# Patient Record
Sex: Female | Born: 1950 | Race: Black or African American | Hispanic: No | Smoking: Former smoker
Health system: Southern US, Community
[De-identification: ages and names within clinical notes are randomized; demographics above are authoritative.]

## PROBLEM LIST (undated history)

## (undated) DIAGNOSIS — G629 Polyneuropathy, unspecified: Secondary | ICD-10-CM

## (undated) DIAGNOSIS — I2510 Atherosclerotic heart disease of native coronary artery without angina pectoris: Secondary | ICD-10-CM

## (undated) DIAGNOSIS — E785 Hyperlipidemia, unspecified: Secondary | ICD-10-CM

## (undated) DIAGNOSIS — I471 Supraventricular tachycardia: Secondary | ICD-10-CM

## (undated) DIAGNOSIS — I341 Nonrheumatic mitral (valve) prolapse: Secondary | ICD-10-CM

## (undated) DIAGNOSIS — I10 Essential (primary) hypertension: Secondary | ICD-10-CM

## (undated) DIAGNOSIS — K219 Gastro-esophageal reflux disease without esophagitis: Secondary | ICD-10-CM

## (undated) DIAGNOSIS — F32A Depression, unspecified: Secondary | ICD-10-CM

## (undated) DIAGNOSIS — N289 Disorder of kidney and ureter, unspecified: Secondary | ICD-10-CM

## (undated) DIAGNOSIS — F329 Major depressive disorder, single episode, unspecified: Secondary | ICD-10-CM

## (undated) DIAGNOSIS — M1990 Unspecified osteoarthritis, unspecified site: Secondary | ICD-10-CM

## (undated) HISTORY — DX: Essential (primary) hypertension: I10

## (undated) HISTORY — DX: Gastro-esophageal reflux disease without esophagitis: K21.9

## (undated) HISTORY — DX: Unspecified osteoarthritis, unspecified site: M19.90

## (undated) HISTORY — DX: Supraventricular tachycardia: I47.1

## (undated) HISTORY — DX: Atherosclerotic heart disease of native coronary artery without angina pectoris: I25.10

## (undated) HISTORY — PX: OTHER SURGICAL HISTORY: SHX169

## (undated) HISTORY — DX: Nonrheumatic mitral (valve) prolapse: I34.1

## (undated) HISTORY — DX: Major depressive disorder, single episode, unspecified: F32.9

## (undated) HISTORY — DX: Depression, unspecified: F32.A

## (undated) HISTORY — DX: Hyperlipidemia, unspecified: E78.5

## (undated) HISTORY — DX: Polyneuropathy, unspecified: G62.9

---

## 1976-01-08 HISTORY — PX: CHOLECYSTECTOMY: SHX55

## 2002-01-07 DIAGNOSIS — I4719 Other supraventricular tachycardia: Secondary | ICD-10-CM

## 2002-01-07 DIAGNOSIS — I471 Supraventricular tachycardia: Secondary | ICD-10-CM

## 2002-01-07 HISTORY — DX: Supraventricular tachycardia: I47.1

## 2002-01-07 HISTORY — DX: Other supraventricular tachycardia: I47.19

## 2002-01-21 HISTORY — PX: CARDIAC CATHETERIZATION: SHX172

## 2002-01-22 ENCOUNTER — Observation Stay (HOSPITAL_COMMUNITY): Admission: EM | Admit: 2002-01-22 | Discharge: 2002-01-22 | Payer: Self-pay | Admitting: *Deleted

## 2002-01-26 ENCOUNTER — Ambulatory Visit (HOSPITAL_COMMUNITY): Admission: RE | Admit: 2002-01-26 | Discharge: 2002-01-27 | Payer: Self-pay | Admitting: Cardiology

## 2002-11-19 ENCOUNTER — Encounter: Admission: RE | Admit: 2002-11-19 | Discharge: 2002-11-19 | Payer: Self-pay | Admitting: Anesthesiology

## 2002-12-28 ENCOUNTER — Encounter: Admission: RE | Admit: 2002-12-28 | Discharge: 2002-12-28 | Payer: Self-pay | Admitting: Neurological Surgery

## 2003-01-12 ENCOUNTER — Encounter: Admission: RE | Admit: 2003-01-12 | Discharge: 2003-01-12 | Payer: Self-pay | Admitting: Neurological Surgery

## 2003-01-26 ENCOUNTER — Encounter: Admission: RE | Admit: 2003-01-26 | Discharge: 2003-01-26 | Payer: Self-pay | Admitting: Anesthesiology

## 2004-10-25 IMAGING — CT CT L SPINE W/ CM
2 of 8 series · 9 of 20 positions shown, 11 images · IV contrast (omnipaque)
Comparison: none

CLINICAL DATA: Patient with back pain extending into both legs.  She is referred for lumbar myelogram.
 LUMBAR MYELOGRAM
 Using sterile technique under fluoroscopic guidance, I placed a 22 gauge spinal needle into the thecal sac at the L2 level.  CSF is clear.  15 cc of Omnipaque 180 contrast was injected.  The needle is withdrawn.  Multiple images were obtained supplemented with standing flexion and extension views.
 Patient had rudimentary ribs at T12 with 5 lumbar vertebrae.  Mild degenerative disk disease is noted at L2-3 and L3-4.  Shallow ventral defects are seen at L2-3, L3-4, L4-5.  There is no appreciable listhesis for flexion and extension.
 IMPRESSION
 Degenerative changes mid lumbar spine.  CT to follow.
 CT LUMBAR SPINE POST INTRATHECAL CONTRAST
 Axial imaging is directed through the disk levels at L1-2 inferiorly to L5-S1.
 L1-2:  Level is normal in appearance.
 L2-3:  Mild circumferential disk bulge without focal protrusion.  There is no appreciable central nor foraminal stenosis.  Mild facet degenerative changes.
 L3-4:  Diffuse disk bulge without focal protrusion.  Negative for central nor foraminal stenosis.  Mild facet degenerative change.
 L4-5:  Diffuse circumferential disk bulge.  Mild facet degenerative changes bilaterally.  No appreciable central nor foraminal stenosis. 
 L5-S1:  Mild diffuse disk bulge without focal protrusion.  The L5 roots exit without encroachment.  There is no central stenosis.  Facet osteoarthritic changes are seen bilaterally.
 Mild degenerative changes as described.  There is no appreciable central nor foraminal stenosis.  The patient has facet degenerative changes bilaterally predominantly at the L4-5 and L5-S1 levels.  
 MULTIPLANAR REFORMATTED IMAGING
 The sagittal reformatted images demonstrate anatomic alignment of the lumbar spine without listhesis.  Disk protrusions at L2-3, L3-4, and L4-5 redemonstrated.  Under parasagittal sequences, diffuse disk bulge at L3-4 is noted which narrows the inferior foramina bilaterally.  Disk material extends laterally into the left L3 foramen abutting the L3 dorsal root ganglion.  This is not well appreciated in the axial projections.  The remainder of the foramina are patent.  
 Diffuse disk bulge at L3 narrows the inferior foramina with left lateral extension as described above.

[Series 3: recon 2: l-spine helical · axial · 0.27mm/px · z∈[-298,-153]mm · 6 of 339 slices shown, 8 images]
[im 49/339  soft-tissue]
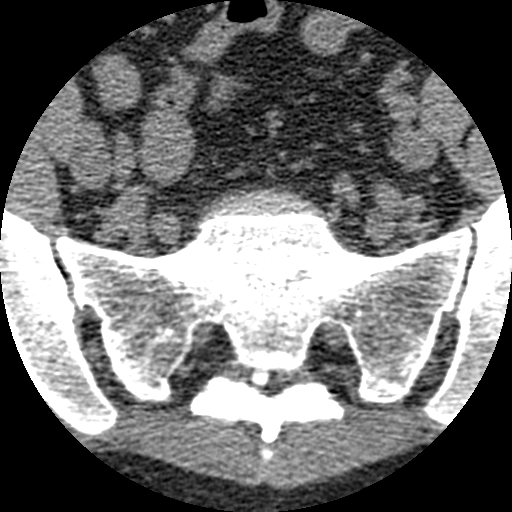
[im 49/339  bone]
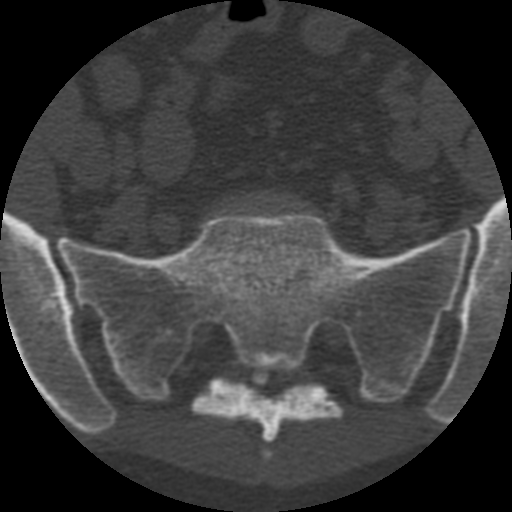
[im 97/339  bone]
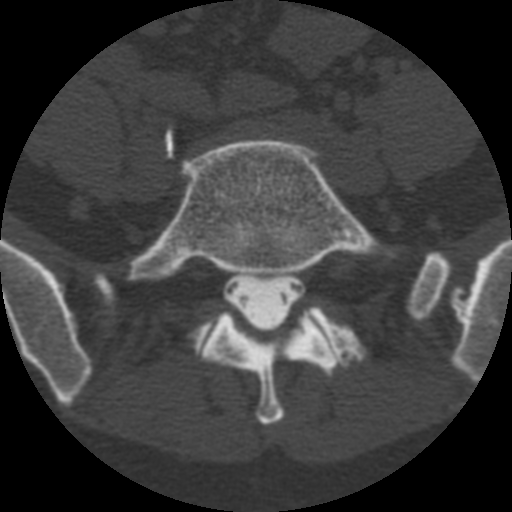
[im 145/339  bone]
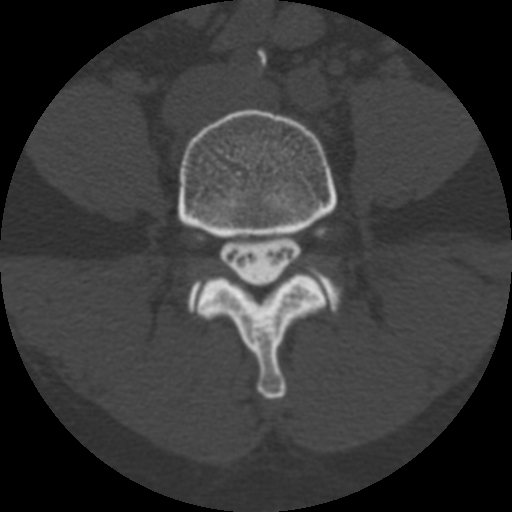
[im 194/339  bone]
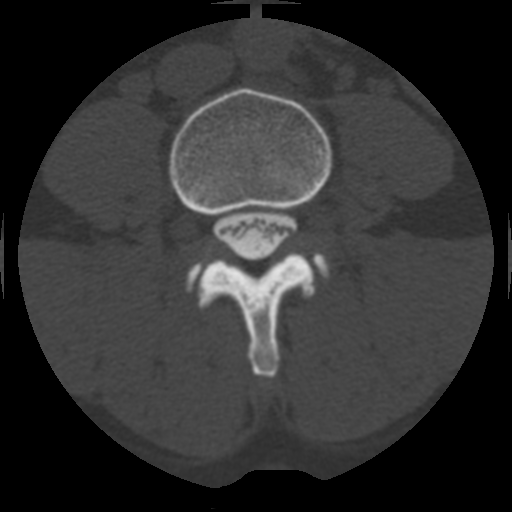
[im 242/339  soft-tissue]
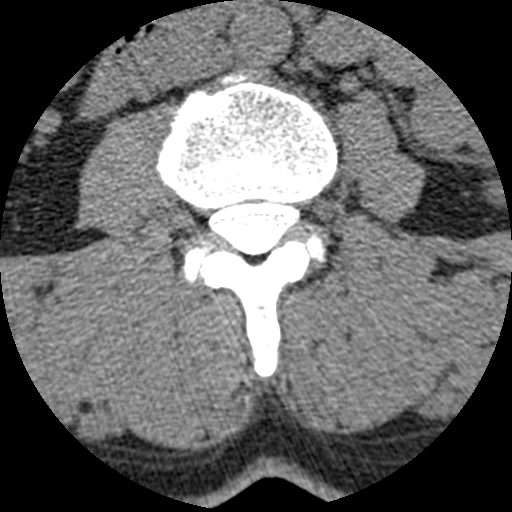
[im 242/339  bone]
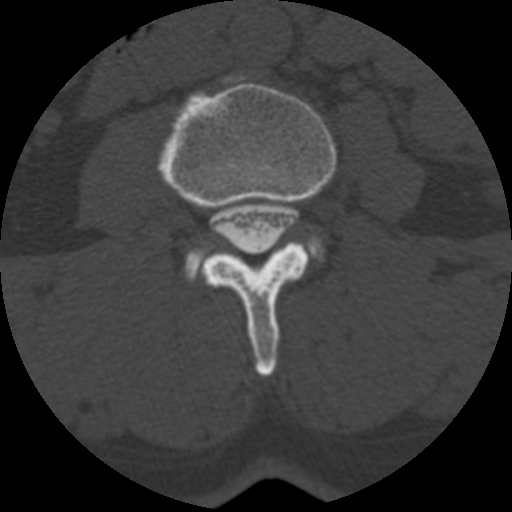
[im 290/339  bone]
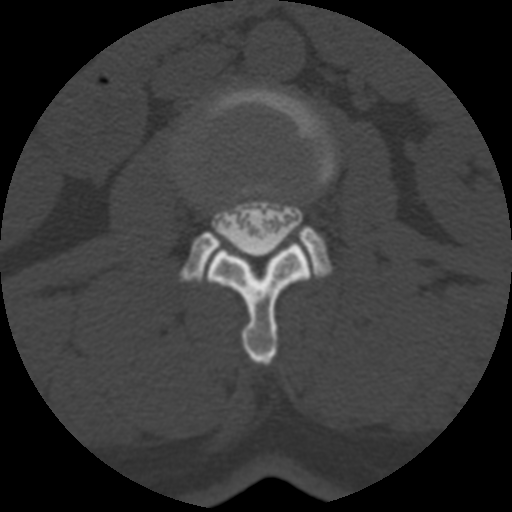

[Series 523: average · sagittal · 0.41mm/px · 3 of 40 slices shown]
[im 8/40  bone]
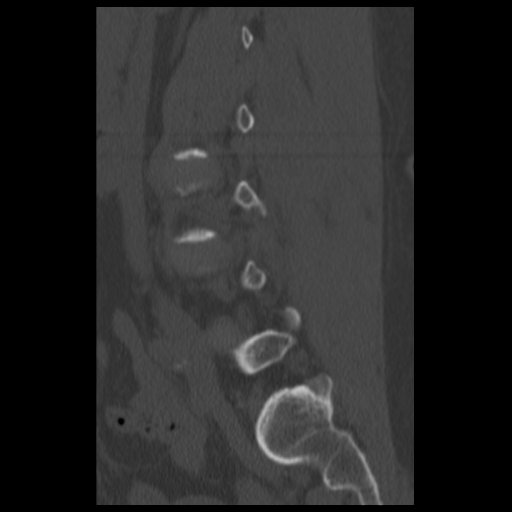
[im 16/40  bone]
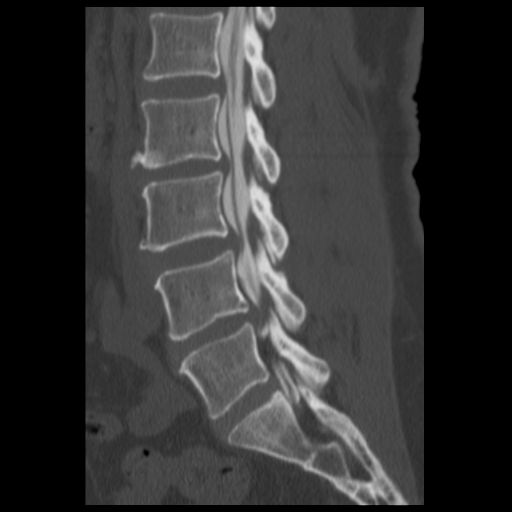
[im 24/40  bone]
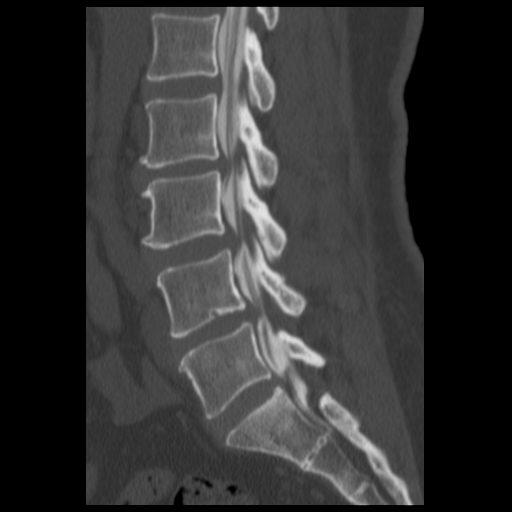

[9 of 20 positions shown; findings below may reference images not displayed]

## 2005-01-09 ENCOUNTER — Ambulatory Visit: Payer: Self-pay | Admitting: Cardiology

## 2005-02-18 ENCOUNTER — Ambulatory Visit: Payer: Self-pay | Admitting: Cardiology

## 2006-10-01 ENCOUNTER — Ambulatory Visit: Payer: Self-pay | Admitting: Cardiology

## 2010-12-05 ENCOUNTER — Encounter: Payer: Self-pay | Admitting: *Deleted

## 2010-12-06 ENCOUNTER — Ambulatory Visit (INDEPENDENT_AMBULATORY_CARE_PROVIDER_SITE_OTHER): Payer: Self-pay | Admitting: Cardiovascular Disease

## 2010-12-06 ENCOUNTER — Encounter: Payer: Self-pay | Admitting: *Deleted

## 2010-12-06 ENCOUNTER — Encounter: Payer: Self-pay | Admitting: Cardiovascular Disease

## 2010-12-06 DIAGNOSIS — R079 Chest pain, unspecified: Secondary | ICD-10-CM

## 2010-12-06 DIAGNOSIS — R0602 Shortness of breath: Secondary | ICD-10-CM

## 2010-12-06 DIAGNOSIS — I498 Other specified cardiac arrhythmias: Secondary | ICD-10-CM

## 2010-12-06 DIAGNOSIS — I4719 Other supraventricular tachycardia: Secondary | ICD-10-CM

## 2010-12-06 DIAGNOSIS — I10 Essential (primary) hypertension: Secondary | ICD-10-CM

## 2010-12-06 DIAGNOSIS — I2510 Atherosclerotic heart disease of native coronary artery without angina pectoris: Secondary | ICD-10-CM

## 2010-12-06 DIAGNOSIS — I471 Supraventricular tachycardia: Secondary | ICD-10-CM

## 2010-12-06 NOTE — Assessment & Plan Note (Signed)
She is currently in sinus rhythm with PACs. She has not had any documented arrhythmia since that ablation in 2004.

## 2010-12-06 NOTE — Patient Instructions (Signed)
   Echo  Cardiolite stress test - hold Toprol on day of test If the results of your test are normal or stable, you will receive a letter.  If they are abnormal, the nurse will contact you by phone. Follow up in  1 month - see above

## 2010-12-06 NOTE — Assessment & Plan Note (Signed)
The patient is currently having chest pain with some anginal features and some atypical features. She seems to be under significant stress at work which might be contributing to her symptoms. Nonetheless, she has multiple risk factors for coronary artery disease. Previous cardiac catheterization in 2004 showed significant disease in a diagonal branch but otherwise no significant disease in the main arteries. Due to all of that, I recommend further evaluation with a treadmill nuclear stress test. Her baseline ECG is slightly abnormal with poor R-wave progression in the anterior leads and nonspecific T wave changes. In the meantime I recommend continuing current medications and taking her risk factors. She does have associated dyspnea and will request a transthoracic echocardiogram as well.

## 2010-12-06 NOTE — Assessment & Plan Note (Signed)
Her blood pressure is slightly low. We'll recheck upon followup.

## 2010-12-06 NOTE — Progress Notes (Signed)
HPI  This is a 60 year old female who is referred for evaluation of chest pain. The patient had cardiac catheterization in the past in 2004 which showed an 80% stenosis in a moderate diagonal branch and no other significant disease. 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 She underwent successful ablation at that time without recurrences since then. The patient has been having chest pain for the last few months. It's described as substernal tightness which mostly happens at work when she is under significant stress. 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 She describes excess mental and physical status when she is working. The chest pain usually lasts for a few minutes and subsides on its own. It does not radiate. It is associated with dyspnea. She denies any palpitations, syncope or presyncope. She has chronic medical conditions that include diabetes, hypertension and hyperlipidemia.  Allergies  Allergen Reactions  . Latex     Itching and rash     Current Outpatient Prescriptions on File Prior to Visit  Medication Sig Dispense Refill  . aspirin EC 81 MG tablet Take 81 mg by mouth daily.        . citalopram (CELEXA) 40 MG tablet Take 40 mg by mouth daily.        Marland Kitchen lisinopril-hydrochlorothiazide (PRINZIDE,ZESTORETIC) 20-12.5 MG per tablet Take 0.5 tablets by mouth daily.        . metFORMIN (GLUCOPHAGE-XR) 500 MG 24 hr tablet Take 500 mg by mouth 2 (two) times daily.        . metoprolol succinate (TOPROL-XL) 25 MG 24 hr tablet Take 25 mg by mouth daily.        . Multiple Vitamins-Minerals (CENTRUM SILVER PO) Take 1 tablet by mouth daily.        Marland Kitchen oxybutynin (DITROPAN) 5 MG tablet Take 5 mg by mouth 3 (three) times daily.        . simvastatin (ZOCOR) 80 MG tablet Take 80 mg by mouth at bedtime.           Past Medical History  Diagnosis Date  . Diabetes mellitus   . Dyslipidemia   . Mitral valve prolapse   .  GERD (gastroesophageal reflux disease)   . Peripheral neuropathy   . Depression   . Osteoarthritis   . CAD (coronary artery disease)     80% stenosis in moderate size diagonal on cath in 2004. with last adenosine Cardiolite study in 2007 showing no abnormalities  . Hypertension   . AV nodal tachycardia 2004    with radiofrequency ablation by Dr. Grant Fontana in 2004  . Hyperlipidemia      Past Surgical History  Procedure Date  . Cholecystectomy 1978  . Left breast biopsy     By Dr. Gabriel Cirri showed fibrocystic changes  . Cardiac catheterization 01/21/2002    80% stenosis of moderate sized diagonal branch     Family History  Problem Relation Age of Onset  . Cancer Father   . Pneumonia Mother   . Cancer Brother     3 brothers died from cancer     History   Social History  . Marital Status: Divorced    Spouse Name: N/A    Number of Children: N/A  . Years of Education: N/A   Occupational History  . Not on file.   Social History Main Topics  . Smoking status: Never Smoker   . Smokeless tobacco: Never Used  .  Alcohol Use: No  . Drug Use: No  . Sexually Active: Not on file   Other Topics Concern  . 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 3 brothers died with cancer.     ROS Constitutional: Negative for fever, chills, diaphoresis, activity change, appetite change and fatigue.  HENT: Negative for hearing loss, nosebleeds, congestion, sore throat, facial swelling, drooling, trouble swallowing, neck pain, voice change, sinus pressure and tinnitus.  Eyes: Negative for photophobia, pain, discharge and visual disturbance.  Respiratory: Negative for apnea, cough and wheezing.  Cardiovascular: Negative for  palpitations and leg swelling.  Gastrointestinal: Negative for nausea, vomiting, abdominal pain, diarrhea, constipation, blood in stool and abdominal  distention.  Genitourinary: Negative for dysuria, urgency, frequency, hematuria and decreased urine volume.  Musculoskeletal: Negative for myalgias, back pain, joint swelling, arthralgias and gait problem.  Skin: Negative for color change, pallor, rash and wound.  Neurological: Negative for dizziness, tremors, seizures, syncope, speech difficulty, weakness, light-headedness, numbness and headaches.  Psychiatric/Behavioral: Negative for suicidal ideas, hallucinations, behavioral problems and agitation. The patient is not nervous/anxious.     PHYSICAL EXAM   BP 95/61  Pulse 67  Ht 5\' 7"  (1.702 m)  Wt 173 lb (78.472 kg)  BMI 27.10 kg/m2  Constitutional: She is oriented to person, place, and time. She appears well-developed and well-nourished. No distress.  HENT: No nasal discharge.  Head: Normocephalic and atraumatic.  Eyes: Pupils are equal, round, and reactive to light. Right eye exhibits no discharge. Left eye exhibits no discharge.  Neck: Normal range of motion. Neck supple. No JVD present. No thyromegaly present.  Cardiovascular: Normal rate, regular rhythm, normal heart sounds and intact distal pulses. Exam reveals no gallop and no friction rub.  No murmur heard.  Pulmonary/Chest: Effort normal and breath sounds normal. No stridor. No respiratory distress. She has no wheezes. She has no rales. She exhibits no tenderness.  Abdominal: Soft. Bowel sounds are normal. She exhibits no distension. There is no tenderness. There is no rebound and no guarding.  Musculoskeletal: Normal range of motion. She exhibits no edema and no tenderness.  Neurological: She is alert and oriented to person, place, and time. Coordination normal.  Skin: Skin is warm and dry. No rash noted. She is not diaphoretic. No erythema. No pallor.  Psychiatric: She has a normal mood and affect. Her behavior is normal. Judgment and thought content normal.    EKG: Normal sinus rhythm with frequent PACs. Poor R wave  progression in the precordial leads. Nonspecific T wave changes.   ASSESSMENT AND PLAN

## 2010-12-07 ENCOUNTER — Other Ambulatory Visit: Payer: Self-pay | Admitting: Cardiovascular Disease

## 2010-12-14 ENCOUNTER — Ambulatory Visit (HOSPITAL_COMMUNITY)
Admission: RE | Admit: 2010-12-14 | Discharge: 2010-12-14 | Disposition: A | Discharge status: Home / Self Care | Payer: Self-pay | Source: Ambulatory Visit | Attending: Cardiovascular Disease | Admitting: Cardiovascular Disease

## 2010-12-14 ENCOUNTER — Encounter (HOSPITAL_COMMUNITY): Payer: Self-pay | Admitting: Cardiology

## 2010-12-14 ENCOUNTER — Ambulatory Visit (INDEPENDENT_AMBULATORY_CARE_PROVIDER_SITE_OTHER): Payer: Self-pay | Admitting: *Deleted

## 2010-12-14 DIAGNOSIS — R079 Chest pain, unspecified: Secondary | ICD-10-CM | POA: Insufficient documentation

## 2010-12-14 DIAGNOSIS — I2510 Atherosclerotic heart disease of native coronary artery without angina pectoris: Secondary | ICD-10-CM

## 2010-12-14 DIAGNOSIS — E119 Type 2 diabetes mellitus without complications: Secondary | ICD-10-CM | POA: Insufficient documentation

## 2010-12-14 DIAGNOSIS — E785 Hyperlipidemia, unspecified: Secondary | ICD-10-CM | POA: Insufficient documentation

## 2010-12-14 DIAGNOSIS — R0602 Shortness of breath: Secondary | ICD-10-CM

## 2010-12-14 DIAGNOSIS — I10 Essential (primary) hypertension: Secondary | ICD-10-CM | POA: Insufficient documentation

## 2010-12-14 DIAGNOSIS — I517 Cardiomegaly: Secondary | ICD-10-CM

## 2010-12-14 MED ORDER — TECHNETIUM TC 99M TETROFOSMIN IV KIT
10.0000 | PACK | Freq: Once | INTRAVENOUS | Status: AC | PRN
  Administered 2010-12-14: 10 via INTRAVENOUS

## 2010-12-14 MED ORDER — TECHNETIUM TC 99M TETROFOSMIN IV KIT
30.0000 | PACK | Freq: Once | INTRAVENOUS | Status: AC | PRN
  Administered 2010-12-14: 30 via INTRAVENOUS

## 2010-12-14 NOTE — Progress Notes (Signed)
  Echocardiogram 2D Echocardiogram has been performed.  Dewitt Hoes, RDCS 12/14/2010, 1:43 PM

## 2010-12-14 NOTE — Progress Notes (Signed)
Stress Lab Nurses Notes - Sarah Baez 12/14/2010  Reason for doing test: CAD and Chest Pain Type of test: Lexiscan Myoview and Test Changed unable to reach THR, (Fatigue) Eugenie Birks given Nurse performing test: Parke Poisson, RN Nuclear Medicine Tech: Lyndel Pleasure Echo Tech: Not Applicable MD performing test: Ival Bible & Joni Reining NP Family MD: Sasser Test explained and consent signed: yes IV started: 22g jelco, Saline lock flushed, No redness or edema and Saline lock started in radiology Symptoms: SOB & Fatigue  while on TM and lightheaded with Lexiscan Treatment/Intervention: None Reason test stopped: protocol completed After recovery IV was: Discontinued via X-ray tech and No redness or edema Patient to return to Nuc. Med at : 12:00  Patient discharged: Home Patient's Condition upon discharge was: stable Comments: During test BP 162/72 & HR 122.  Recovery BP 118/60 & HR 84.   Symptoms resolved in recovery. Erskine Speed T

## 2010-12-18 ENCOUNTER — Encounter: Payer: Self-pay | Admitting: *Deleted

## 2011-01-04 ENCOUNTER — Ambulatory Visit (INDEPENDENT_AMBULATORY_CARE_PROVIDER_SITE_OTHER): Payer: Self-pay | Admitting: Cardiovascular Disease

## 2011-01-04 ENCOUNTER — Encounter: Payer: Self-pay | Admitting: Cardiovascular Disease

## 2011-01-04 DIAGNOSIS — R079 Chest pain, unspecified: Secondary | ICD-10-CM | POA: Insufficient documentation

## 2011-01-04 NOTE — Patient Instructions (Signed)
   Follow up as needed. Your physician recommends that you continue on your current medications as directed. Please refer to the Current Medication list given to you today. 

## 2011-01-04 NOTE — Assessment & Plan Note (Signed)
Her chest pain is likely noncardiac and I suspect is related to stress and anxiety. Her cardiac workup was unremarkable including stress test and echocardiogram. I recommend continuing aggressive treatment of risk factors. I also suggested starting an exercise program. She might have to consider switching her job if she continues to have significant stress. She can followup with [place] as needed.

## 2011-01-04 NOTE — Progress Notes (Signed)
HPI   this is a 60 year old female who is here today for followup visit. She was seen recently for atypical chest pain and dyspnea. She underwent cardiac evaluation that included a nuclear stress test and an echocardiogram. Her stress test overall was unremarkable. Echocardiogram showed normal LV systolic function without significant valvular abnormalities. Her chest pain overall is better than before. It's mostly related to stress at her work.  Allergies  Allergen Reactions  . Latex     Itching and rash     Current Outpatient Prescriptions on File Prior to Visit  Medication Sig Dispense Refill  . aspirin EC 81 MG tablet Take 81 mg by mouth daily.        . Cholecalciferol (VITAMIN D3) 5000 UNITS CAPS Take by mouth daily.        . citalopram (CELEXA) 40 MG tablet Take 40 mg by mouth daily.        Marland Kitchen lisinopril-hydrochlorothiazide (PRINZIDE,ZESTORETIC) 20-12.5 MG per tablet Take 0.5 tablets by mouth daily.        . metFORMIN (GLUCOPHAGE-XR) 500 MG 24 hr tablet Take 500 mg by mouth 2 (two) times daily.        . metoprolol succinate (TOPROL-XL) 25 MG 24 hr tablet Take 25 mg by mouth daily.        . Multiple Vitamins-Minerals (CENTRUM SILVER PO) Take 1 tablet by mouth daily.        Marland Kitchen oxybutynin (DITROPAN) 5 MG tablet Take 5 mg by mouth 3 (three) times daily.        . simvastatin (ZOCOR) 80 MG tablet Take 80 mg by mouth at bedtime.           Past Medical History  Diagnosis Date  . Diabetes mellitus   . Dyslipidemia   . Mitral valve prolapse   . GERD (gastroesophageal reflux disease)   . Peripheral neuropathy   . Depression   . Osteoarthritis   . CAD (coronary artery disease)     80% stenosis in moderate size diagonal on cath in 2004. with last adenosine Cardiolite study in 2007 showing no abnormalities  . Hypertension   . AV nodal tachycardia 2004    with radiofrequency ablation by Dr. Grant Fontana in 2004  . Hyperlipidemia      Past Surgical History  Procedure Date  . Cholecystectomy  1978  . Left breast biopsy     By Dr. Gabriel Cirri showed fibrocystic changes  . Cardiac catheterization 01/21/2002    80% stenosis of moderate sized diagonal branch     Family History  Problem Relation Age of Onset  . Cancer Father   . Pneumonia Mother   . Cancer Brother     3 brothers died from cancer     History   Social History  . Marital Status: Divorced    Spouse Name: N/A    Number of Children: N/A  . Years of Education: N/A   Occupational History  . Not on file.   Social History Main Topics  . Smoking status: Never Smoker   . Smokeless tobacco: Never Used  . Alcohol Use: No  . Drug Use: No  . Sexually Active: Not on file   Other Topics Concern  . 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 3 brothers died with cancer.     PHYSICAL EXAM   BP 136/78  Pulse 62  Ht 5\' 5"  (1.651  m)  Wt 152 lb (68.947 kg)  BMI 25.29 kg/m2  Constitutional: She is oriented to person, place, and time. She appears well-developed and well-nourished. No distress.  HENT: No nasal discharge.  Head: Normocephalic and atraumatic.  Eyes: Pupils are equal, round, and reactive to light. Right eye exhibits no discharge. Left eye exhibits no discharge.  Neck: Normal range of motion. Neck supple. No JVD present. No thyromegaly present.  Cardiovascular: Normal rate, regular rhythm, normal heart sounds . Exam reveals no gallop and no friction rub.  No murmur heard.  Pulmonary/Chest: Effort normal and breath sounds normal. No stridor. No respiratory distress. She has no wheezes. She has no rales. She exhibits no tenderness.  Abdominal: Soft. Bowel sounds are normal. She exhibits no distension. There is no tenderness. There is no rebound and no guarding.  Musculoskeletal: Normal range of motion. She exhibits no edema and no tenderness.  Neurological: She is alert and oriented to  person, place, and time. Coordination normal.  Skin: Skin is warm and dry. No rash noted. She is not diaphoretic. No erythema. No pallor.  Psychiatric: She has a normal mood and affect. Her behavior is normal. Judgment and thought content normal.      ASSESSMENT AND PLAN

## 2013-10-13 DIAGNOSIS — F321 Major depressive disorder, single episode, moderate: Secondary | ICD-10-CM | POA: Diagnosis not present

## 2013-10-13 DIAGNOSIS — K210 Gastro-esophageal reflux disease with esophagitis: Secondary | ICD-10-CM | POA: Diagnosis not present

## 2013-10-13 DIAGNOSIS — Z23 Encounter for immunization: Secondary | ICD-10-CM | POA: Diagnosis not present

## 2013-10-13 DIAGNOSIS — E119 Type 2 diabetes mellitus without complications: Secondary | ICD-10-CM | POA: Diagnosis not present

## 2013-10-13 DIAGNOSIS — E780 Pure hypercholesterolemia: Secondary | ICD-10-CM | POA: Diagnosis not present

## 2013-10-13 DIAGNOSIS — I10 Essential (primary) hypertension: Secondary | ICD-10-CM | POA: Diagnosis not present

## 2013-11-23 DIAGNOSIS — R072 Precordial pain: Secondary | ICD-10-CM | POA: Diagnosis not present

## 2013-12-06 ENCOUNTER — Encounter: Payer: Self-pay | Admitting: Cardiology

## 2013-12-06 DIAGNOSIS — R9439 Abnormal result of other cardiovascular function study: Secondary | ICD-10-CM | POA: Diagnosis not present

## 2013-12-06 DIAGNOSIS — R0602 Shortness of breath: Secondary | ICD-10-CM | POA: Diagnosis not present

## 2013-12-06 DIAGNOSIS — R079 Chest pain, unspecified: Secondary | ICD-10-CM | POA: Diagnosis not present

## 2013-12-06 NOTE — Progress Notes (Signed)
ERROR

## 2013-12-22 ENCOUNTER — Encounter: Payer: Self-pay | Admitting: *Deleted

## 2013-12-22 ENCOUNTER — Ambulatory Visit (INDEPENDENT_AMBULATORY_CARE_PROVIDER_SITE_OTHER): Payer: Medicare Other | Admitting: Cardiology

## 2013-12-22 ENCOUNTER — Encounter: Payer: Self-pay | Admitting: Cardiology

## 2013-12-22 VITALS — BP 111/68 | HR 59 | Ht 65.0 in | Wt 158.0 lb

## 2013-12-22 DIAGNOSIS — R0789 Other chest pain: Secondary | ICD-10-CM

## 2013-12-22 MED ORDER — NITROGLYCERIN 0.4 MG SL SUBL: 0.4000 mg | SUBLINGUAL_TABLET | SUBLINGUAL | Status: DC | PRN

## 2013-12-22 NOTE — Patient Instructions (Signed)
   Begin Nitroglycerin as needed for severe chest pain only - new sent to pharm Continue all other medications.   Your physician has requested that you have a cardiac catheterization. Cardiac catheterization is used to diagnose and/or treat various heart conditions. Doctors may recommend this procedure for a number of different reasons. The most common reason is to evaluate chest pain. Chest pain can be a symptom of coronary artery disease (CAD), and cardiac catheterization can show whether plaque is narrowing or blocking your heart's arteries. This procedure is also used to evaluate the valves, as well as measure the blood flow and oxygen levels in different parts of your heart. For further information please visit [email or web address] Please follow instruction sheet, as given. Follow up will be given at time of discharge from procedure.

## 2013-12-22 NOTE — Progress Notes (Signed)
   Clinical Summary Shelby Trevino is a 63 y.o.female seen today for the following medical problems.   1. Chest pain - history of atypical chest pain, workup in 2012 with nuclear stress and echo unremarkable. Prior notes mention chest pain going back as 2008. Based on clinic notes cath in 2008 showed diagonal disease per prior notes but otherwise patent vessels, cath report I do not see in our system.  - recent episodes of chest pain. Pressure right chest, 9/10. +SOB, + nausea. Can occur at rest or exertion. Worst with taking deep breath. Gets better with relaxation. Varying in duration, can last minutes to hours. Occurs approx twice a week. Can be brought on with stress. Stable frequency, stable severity. Different from previous chest pain. - can have some DOE with activity.  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 Normal LVEF.    Past Medical History  Diagnosis Date  . Diabetes mellitus   . Dyslipidemia   . Mitral valve prolapse   . GERD (gastroesophageal reflux disease)   . Peripheral neuropathy   . Depression   . Osteoarthritis   . CAD (coronary artery disease)     80% stenosis in moderate size diagonal on cath in 2004. with last adenosine Cardiolite study in 2007 showing no abnormalities  . Hypertension   . AV nodal tachycardia 2004    with radiofrequency ablation by Dr. Taylo in 2004  . Hyperlipidemia      Allergies  Allergen Reactions  . Latex     Itching and rash     Current Outpatient Prescriptions  Medication Sig Dispense Refill  . aspirin EC 81 MG tablet Take 81 mg by mouth daily.      . Cholecalciferol (VITAMIN D3) 5000 UNITS CAPS Take by mouth daily.      . citalopram (CELEXA) 40 MG tablet Take 40 mg by mouth daily.      . lisinopril-hydrochlorothiazide (PRINZIDE,ZESTORETIC) 20-12.5 MG per tablet Take 0.5 tablets by mouth daily.        . metFORMIN (GLUCOPHAGE-XR) 500 MG 24 hr tablet Take 500 mg by mouth 2 (two) times daily.      . metoprolol succinate (TOPROL-XL) 25 MG 24 hr tablet Take 25 mg by mouth daily.      . Multiple Vitamins-Minerals (CENTRUM SILVER PO) Take 1 tablet by mouth daily.      . oxybutynin (DITROPAN) 5 MG tablet Take 5 mg by mouth 3 (three) times daily.      . simvastatin (ZOCOR) 80 MG tablet Take 80 mg by mouth at bedtime.       No current facility-administered medications for this visit.     Past Surgical History  Procedure Laterality Date  . Cholecystectomy  1978  . Left breast biopsy      By Dr. DeMason showed fibrocystic changes  . Cardiac catheterization  01/21/2002    80% stenosis of moderate sized diagonal [place]     Allergies  Allergen Reactions  . Latex     Itching and rash      Family History  Problem Relation Age of Onset  . Cancer Father   . Pneumonia Mother   . Cancer Brother     3 brothers died from cancer     Social History Shelby Trevino reports that she has never smoked. She has never used smokeless tobacco. Shelby Trevino reports that she does   not drink alcohol.   Review of Systems CONSTITUTIONAL: No weight loss, fever, chills, weakness or fatigue.  HEENT: Eyes: No visual loss, blurred vision, double vision or yellow sclerae.No hearing loss, sneezing, congestion, runny nose or sore throat.  SKIN: No rash or itching.  CARDIOVASCULAR: per HPI RESPIRATORY: No shortness of breath, cough or sputum.  GASTROINTESTINAL: No anorexia, nausea, vomiting or diarrhea. No abdominal pain or blood.  GENITOURINARY: No burning on urination, no polyuria NEUROLOGICAL: No headache, dizziness, syncope, paralysis, ataxia, numbness or tingling in the extremities. No change in bowel or bladder control.  MUSCULOSKELETAL: No muscle, back pain, joint pain or stiffness.  LYMPHATICS: No enlarged nodes. No history of splenectomy.  PSYCHIATRIC: No history of depression or anxiety.   ENDOCRINOLOGIC: No reports of sweating, cold or heat intolerance. No polyuria or polydipsia.  Marland Kitchen   Physical Examination p 59 bp 111/68 Wt 158 lbs BMI 26 Gen: resting comfortably, no acute distress HEENT: no scleral icterus, pupils equal round and reactive, no palptable cervical adenopathy,  CV: RRR, no m/r/g, no JVD, no carotid bruits Resp: Clear to auscultation bilaterally GI: abdomen is soft, non-tender, non-distended, normal bowel sounds, no hepatosplenomegaly MSK: extremities are warm, no edema.  Skin: warm, no rash Neuro:  no focal deficits Psych: appropriate affect   Diagnostic Studies 12/2010 Echo Study Conclusions  - Left ventricle: The cavity size was mildly reduced. Wall thickness was increased in a pattern of mild LVH. Systolic function was normal. The estimated ejection fraction was in the range of 60% to 65%. Wall motion was normal; there were no regional wall motion abnormalities. Doppler parameters are consistent with abnormal left ventricular relaxation (grade 1 diastolic dysfunction). - Aortic valve: Trileaflet; mildly calcified leaflets. - Mitral valve: Calcified annulus. Trivial regurgitation. - Left atrium: The atrium was at the upper limits of normal in size. - Tricuspid valve: Trivial regurgitation. - Pericardium, extracardiac: A trivial pericardial effusion was identified.  12/2010 Stress MPI  IMPRESSION: Low risk Lexiscan Myoview with concurrent ambulation. There were no diagnostic ST-segment changes, no sustained arrhythmias. Frequent atrial ectopy noted. Perfusion imaging is most consistent with breast attenuation affecting the anteroseptal region rather than scar, and there are no large areas of ischemia identified. LVEF is 69%.   11/2013 Lexiscan MPI [facility] LVEF 74%, normal wall motion. 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    Assessment and Plan  1. Chest pain -  multiple CAD risk factors including DM, HTN, HL. 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       Antoine Poche, M.D

## 2013-12-27 ENCOUNTER — Other Ambulatory Visit: Payer: Self-pay | Admitting: Cardiology

## 2013-12-27 ENCOUNTER — Telehealth: Payer: Self-pay | Admitting: Cardiology

## 2013-12-27 DIAGNOSIS — R0789 Other chest pain: Secondary | ICD-10-CM

## 2013-12-27 NOTE — Telephone Encounter (Signed)
Pt has Medicare only.  No precert required °

## 2013-12-27 NOTE — Telephone Encounter (Signed)
cath  scheduled for Tuesday, 12/28/13 at 10:30 with Dr. Eldridge Dace. Checking percert

## 2013-12-28 ENCOUNTER — Ambulatory Visit (HOSPITAL_COMMUNITY)
Admission: RE | Admit: 2013-12-28 | Discharge: 2013-12-28 | Disposition: A | Discharge status: Home / Self Care | Payer: Medicare Other | Source: Ambulatory Visit | Attending: Interventional Cardiology | Admitting: Interventional Cardiology

## 2013-12-28 ENCOUNTER — Encounter (HOSPITAL_COMMUNITY): Payer: Self-pay | Admitting: Interventional Cardiology

## 2013-12-28 ENCOUNTER — Encounter (HOSPITAL_COMMUNITY)
Admission: RE | Disposition: A | Discharge status: Home / Self Care | Payer: Self-pay | Source: Ambulatory Visit | Attending: Interventional Cardiology

## 2013-12-28 DIAGNOSIS — E1140 Type 2 diabetes mellitus with diabetic neuropathy, unspecified: Secondary | ICD-10-CM | POA: Diagnosis not present

## 2013-12-28 DIAGNOSIS — M1990 Unspecified osteoarthritis, unspecified site: Secondary | ICD-10-CM | POA: Insufficient documentation

## 2013-12-28 DIAGNOSIS — E785 Hyperlipidemia, unspecified: Secondary | ICD-10-CM | POA: Diagnosis not present

## 2013-12-28 DIAGNOSIS — I2510 Atherosclerotic heart disease of native coronary artery without angina pectoris: Secondary | ICD-10-CM | POA: Diagnosis not present

## 2013-12-28 DIAGNOSIS — F329 Major depressive disorder, single episode, unspecified: Secondary | ICD-10-CM | POA: Diagnosis not present

## 2013-12-28 DIAGNOSIS — I10 Essential (primary) hypertension: Secondary | ICD-10-CM | POA: Diagnosis not present

## 2013-12-28 DIAGNOSIS — I341 Nonrheumatic mitral (valve) prolapse: Secondary | ICD-10-CM | POA: Diagnosis not present

## 2013-12-28 DIAGNOSIS — R9439 Abnormal result of other cardiovascular function study: Secondary | ICD-10-CM | POA: Diagnosis present

## 2013-12-28 DIAGNOSIS — K219 Gastro-esophageal reflux disease without esophagitis: Secondary | ICD-10-CM | POA: Insufficient documentation

## 2013-12-28 DIAGNOSIS — R0789 Other chest pain: Secondary | ICD-10-CM

## 2013-12-28 DIAGNOSIS — Z7982 Long term (current) use of aspirin: Secondary | ICD-10-CM | POA: Diagnosis not present

## 2013-12-28 HISTORY — PX: LEFT HEART CATHETERIZATION WITH CORONARY ANGIOGRAM: SHX5451

## 2013-12-28 LAB — CBC
HCT: 35.9 % — ABNORMAL LOW (ref 36.0–46.0)
Hemoglobin: 11.7 g/dL — ABNORMAL LOW (ref 12.0–15.0)
MCH: 27.3 pg (ref 26.0–34.0)
MCHC: 32.6 g/dL (ref 30.0–36.0)
MCV: 83.7 fL (ref 78.0–100.0)
PLATELETS: 268 10*3/uL (ref 150–400)
RBC: 4.29 MIL/uL (ref 3.87–5.11)
RDW: 14 % (ref 11.5–15.5)
WBC: 6.7 10*3/uL (ref 4.0–10.5)

## 2013-12-28 LAB — PROTIME-INR
INR: 0.91 (ref 0.00–1.49)
PROTHROMBIN TIME: 12.3 s (ref 11.6–15.2)

## 2013-12-28 LAB — BASIC METABOLIC PANEL
Anion gap: 11 (ref 5–15)
BUN: 9 mg/dL (ref 6–23)
CALCIUM: 9 mg/dL (ref 8.4–10.5)
CO2: 24 mmol/L (ref 19–32)
Chloride: 103 mEq/L (ref 96–112)
Creatinine, Ser: 0.77 mg/dL (ref 0.50–1.10)
GFR calc non Af Amer: 86 mL/min — ABNORMAL LOW (ref >90)
Glucose, Bld: 133 mg/dL — ABNORMAL HIGH (ref 70–99)
Potassium: 3.9 mmol/L (ref 3.5–5.1)
Sodium: 138 mmol/L (ref 135–145)

## 2013-12-28 LAB — GLUCOSE, CAPILLARY
GLUCOSE-CAPILLARY: 114 mg/dL — AB (ref 70–99)
Glucose-Capillary: 129 mg/dL — ABNORMAL HIGH (ref 70–99)

## 2013-12-28 SURGERY — LEFT HEART CATHETERIZATION WITH CORONARY ANGIOGRAM
Anesthesia: LOCAL

## 2013-12-28 MED ORDER — SODIUM CHLORIDE 0.9 % IJ SOLN: 3.0000 mL | INTRAMUSCULAR | Status: DC | PRN

## 2013-12-28 MED ORDER — METFORMIN HCL ER 500 MG PO TB24
500.0000 mg | ORAL_TABLET | Freq: Two times a day (BID) | ORAL | Status: DC
Start: 2013-12-30 — End: 2021-06-14

## 2013-12-28 MED ORDER — SODIUM CHLORIDE 0.9 % IJ SOLN: 3.0000 mL | Freq: Two times a day (BID) | INTRAMUSCULAR | Status: DC

## 2013-12-28 MED ORDER — ASPIRIN 81 MG PO CHEW
81.0000 mg | CHEWABLE_TABLET | ORAL | Status: AC
  Administered 2013-12-28: 81 mg via ORAL

## 2013-12-28 MED ORDER — MIDAZOLAM HCL 2 MG/2ML IJ SOLN
INTRAMUSCULAR | Status: AC
  Filled 2013-12-28: qty 2

## 2013-12-28 MED ORDER — SODIUM CHLORIDE 0.9 % IV SOLN: INTRAVENOUS | Status: DC

## 2013-12-28 MED ORDER — SODIUM CHLORIDE 0.9 % IV SOLN: 250.0000 mL | INTRAVENOUS | Status: DC | PRN

## 2013-12-28 MED ORDER — LIDOCAINE HCL (PF) 1 % IJ SOLN
INTRAMUSCULAR | Status: AC
  Filled 2013-12-28: qty 30

## 2013-12-28 MED ORDER — HEPARIN (PORCINE) IN NACL 2-0.9 UNIT/ML-% IJ SOLN
INTRAMUSCULAR | Status: AC
  Filled 2013-12-28: qty 1500

## 2013-12-28 MED ORDER — FENTANYL CITRATE 0.05 MG/ML IJ SOLN
INTRAMUSCULAR | Status: AC
  Filled 2013-12-28: qty 2

## 2013-12-28 MED ORDER — ASPIRIN 81 MG PO CHEW
CHEWABLE_TABLET | ORAL | Status: AC
  Administered 2013-12-28: 81 mg via ORAL
  Filled 2013-12-28: qty 1

## 2013-12-28 MED ORDER — NITROGLYCERIN 1 MG/10 ML FOR IR/CATH LAB
INTRA_ARTERIAL | Status: AC
  Filled 2013-12-28: qty 10

## 2013-12-28 MED ORDER — SODIUM CHLORIDE 0.9 % IV SOLN: 1.0000 mL/kg/h | INTRAVENOUS | Status: DC

## 2013-12-28 NOTE — Interval H&P Note (Signed)
Cath Lab Visit (complete for each Cath Lab visit)  Clinical Evaluation Leading to the Procedure:   ACS: No.  Non-ACS:    Anginal Classification: CCS III  Anti-ischemic medical therapy: Minimal Therapy (1 class of medications)  Non-Invasive Test Results: High-risk stress test findings: cardiac mortality >3%/year  Prior CABG: No previous CABG  Ischemic Symptoms? CCS III (Marked limitation of ordinary activity) Anti-ischemic Medical Therapy? Minimal Therapy (1 class of medications) Non-invasive Test Results? High-risk stress test findings: cardiac mortality >3%/yr Prior CABG? No Previous CABG   Patient Information:   1-2V CAD, no prox LAD  A (8)  Indication: 18; Score: 8   Patient Information:   CTO of 1 vessel, no other CAD  A (7)  Indication: 28; Score: 7   Patient Information:   1V CAD with prox LAD  A (9)  Indication: 34; Score: 9   Patient Information:   2V-CAD with prox LAD  A (9)  Indication: 40; Score: 9   Patient Information:   3V-CAD without LMCA  A (9)  Indication: 46; Score: 9   Patient Information:   3V-CAD without LMCA With Abnormal LV systolic function  A (9)  Indication: 48; Score: 9   Patient Information:   LMCA-CAD  A (9)  Indication: 49; Score: 9   Patient Information:   2V-CAD with prox LAD PCI  A (7)  Indication: 62; Score: 7   Patient Information:   2V-CAD with prox LAD CABG  A (8)  Indication: 62; Score: 8   Patient Information:   3V-CAD without LMCA With Low CAD burden(i.e., 3 focal stenoses, low SYNTAX score) PCI  A (7)  Indication: 63; Score: 7   Patient Information:   3V-CAD without LMCA With Low CAD burden(i.e., 3 focal stenoses, low SYNTAX score) CABG  A (9)  Indication: 63; Score: 9   Patient Information:   3V-CAD without LMCA E06c - Intermediate-high CAD burden (i.e., multiple diffuse lesions, presence of CTO, or high SYNTAX score) PCI  U (4)  Indication: 64; Score:  4   Patient Information:   3V-CAD without LMCA E06c - Intermediate-high CAD burden (i.e., multiple diffuse lesions, presence of CTO, or high SYNTAX score) CABG  A (9)  Indication: 64; Score: 9   Patient Information:   LMCA-CAD With Isolated LMCA stenosis  PCI  U (6)  Indication: 65; Score: 6   Patient Information:   LMCA-CAD With Isolated LMCA stenosis  CABG  A (9)  Indication: 65; Score: 9   Patient Information:   LMCA-CAD Additional CAD, low CAD burden (i.e., 1- to 2-vessel additional involvement, low SYNTAX score) PCI  U (5)  Indication: 66; Score: 5   Patient Information:   LMCA-CAD Additional CAD, low CAD burden (i.e., 1- to 2-vessel additional involvement, low SYNTAX score) CABG  A (9)  Indication: 66; Score: 9   Patient Information:   LMCA-CAD Additional CAD, intermediate-high CAD burden (i.e., 3-vessel involvement, presence of CTO, or high SYNTAX score) PCI  I (3)  Indication: 67; Score: 3   Patient Information:   LMCA-CAD Additional CAD, intermediate-high CAD burden (i.e., 3-vessel involvement, presence of CTO, or high SYNTAX score) CABG  A (9)  Indication: 67; Score: 9     History and Physical Interval Note:  12/28/2013 10:35 AM  Shelby Trevino  has presented today for surgery, with the diagnosis of abnormal stress test  The various methods of treatment have been discussed with the patient and family. After consideration of risks, benefits and other options  for treatment, the patient has consented to  Procedure(s): LEFT HEART CATHETERIZATION WITH CORONARY ANGIOGRAM (N/A) as a surgical intervention .  The patient's history has been reviewed, patient examined, no change in status, stable for surgery.  I have reviewed the patient's chart and labs.  Questions were answered to the patient's satisfaction.     VARANASI,JAYADEEP S.

## 2013-12-28 NOTE — Progress Notes (Signed)
Per Smokey ok to bring to cath lab with lab results

## 2013-12-28 NOTE — CV Procedure (Signed)
       PROCEDURE:  Left heart catheterization with selective coronary angiography,   INDICATIONS:  Abnormal stress test  The risks, benefits, and details of the procedure were explained to the patient.  The patient verbalized understanding and wanted to proceed.  Informed written consent was obtained.  PROCEDURE TECHNIQUE:  After Xylocaine anesthesia a 58F slender sheath was placed in the right radial artery with a single anterior needle wall stick.   IV Heparin was given.  Right coronary angiography was done using a Judkins R4 guide catheter.  Left coronary angiography was done using a Judkins L3.5 guide catheter.  Left ventriculography was done using a pigtail catheter.  A TR band was used for hemostasis.   CONTRAST:  Total of 60 cc.  COMPLICATIONS:  None.    HEMODYNAMICS:  Aortic pressure was 109/62; LV pressure was 111/4; LVEDP 11.  There was no gradient between the left ventricle and aorta.    ANGIOGRAPHIC DATA:   The left main coronary artery is widely patent.  The left anterior descending artery is a large vessel which reaches the apex. Just after the origin of a medium-sized second diagonal, there is a moderate 40% lesion. There is another focal moderate lesion just past this. The remainder the LAD is widely patent. The areas of moderate disease appear less significant in the cranial views.  The third diagonal is medium-sized and patent. The first diagonal is large and widely patent.  The second diagonal is medium-sized and patent  The left circumflex artery is a large vessel. There is only minimal atherosclerosis. There is a large OM1 which is widely patent.  The right coronary artery is a large dominant vessel. There is minimal atherosclerosis in the RCA system. The posterior descending artery and posterior lateral arteries are widely patent.  LEFT VENTRICULOGRAM:  Left ventricular angiogram was not done.  LVEDP was 11 mmHg.  IMPRESSIONS:  1. Normal left main coronary  artery. 2. Mild to moderate disease in the mid left anterior descending artery. 3. Widely patent left circumflex artery and its branches. 4. Widely patent right coronary artery. 5. Left ventricular systolic function not assessed.  LVEDP 11 mmHg.    RECOMMENDATION:  This appears to be a false positive stress test. There is certainly no evidence of multivessel disease. There is moderate single-vessel disease. We'll continue to manage the patient medically. If she had worsening symptoms, could consider FFR of the LAD, but I think that at this point based on the cranial views, FFR would likely be negative.  Follow-up with Dr. Wyline Mood. Aggressive medical therapy.

## 2013-12-28 NOTE — H&P (View-Only) (Signed)
Clinical Summary Shelby Trevino is a 63 y.o.female seen today for the following medical problems.   1. Chest pain - history of atypical chest pain, workup in 2012 with nuclear stress and echo unremarkable. Prior notes mention chest pain going back as 2008. Based on clinic notes cath in 2008 showed diagonal disease per prior notes but otherwise patent vessels, cath report I do not see in our system.  - recent episodes of chest pain. Pressure right chest, 9/10. +SOB, + nausea. Can occur at rest or exertion. Worst with taking deep breath. Gets better with relaxation. Varying in duration, can last minutes to hours. Occurs approx twice a week. Can be brought on with stress. Stable frequency, stable severity. Different from previous chest pain. - can have some DOE with activity.  CAD risk factors: HL, DM, HTN  -[facility], per report multiple small perfusion defects concerning for multivessel disease. Normal LVEF.    Past Medical History  Diagnosis Date  . Diabetes mellitus   . Dyslipidemia   . Mitral valve prolapse   . GERD (gastroesophageal reflux disease)   . Peripheral neuropathy   . Depression   . Osteoarthritis   . CAD (coronary artery disease)     80% stenosis in moderate size diagonal on cath in 2004. with last adenosine Cardiolite study in 2007 showing no abnormalities  . Hypertension   . AV nodal tachycardia 2004    with radiofrequency ablation by Dr. Grant Fontanaaylo in 2004  . Hyperlipidemia      Allergies  Allergen Reactions  . Latex     Itching and rash     Current Outpatient Prescriptions  Medication Sig Dispense Refill  . aspirin EC 81 MG tablet Take 81 mg by mouth daily.      . Cholecalciferol (VITAMIN D3) 5000 UNITS CAPS Take by mouth daily.      . citalopram (CELEXA) 40 MG tablet Take 40 mg by mouth daily.      Marland Kitchen. lisinopril-hydrochlorothiazide (PRINZIDE,ZESTORETIC) 20-12.5 MG per tablet Take 0.5 tablets by mouth daily.        . metFORMIN (GLUCOPHAGE-XR) 500 MG 24 hr tablet Take 500 mg by mouth 2 (two) times daily.      . metoprolol succinate (TOPROL-XL) 25 MG 24 hr tablet Take 25 mg by mouth daily.      . Multiple Vitamins-Minerals (CENTRUM SILVER PO) Take 1 tablet by mouth daily.      Marland Kitchen. oxybutynin (DITROPAN) 5 MG tablet Take 5 mg by mouth 3 (three) times daily.      . simvastatin (ZOCOR) 80 MG tablet Take 80 mg by mouth at bedtime.       No current facility-administered medications for this visit.     Past Surgical History  Procedure Laterality Date  . Cholecystectomy  1978  . Left breast biopsy      By Dr. Gabriel CirrieMason showed fibrocystic changes  . Cardiac catheterization  01/21/2002    80% stenosis of moderate sized diagonal Chrles Selley     Allergies  Allergen Reactions  . Latex     Itching and rash      Family History  Problem Relation Age of Onset  . Cancer Father   . Pneumonia Mother   . Cancer Brother     3 brothers died from cancer     Social History Ms. Hoefle reports that she has never smoked. She has never used smokeless tobacco. Shelby Trevino reports that she does  not drink alcohol.   Review of Systems CONSTITUTIONAL: No weight loss, fever, chills, weakness or fatigue.  HEENT: Eyes: No visual loss, blurred vision, double vision or yellow sclerae.No hearing loss, sneezing, congestion, runny nose or sore throat.  SKIN: No rash or itching.  CARDIOVASCULAR: per HPI RESPIRATORY: No shortness of breath, cough or sputum.  GASTROINTESTINAL: No anorexia, nausea, vomiting or diarrhea. No abdominal pain or blood.  GENITOURINARY: No burning on urination, no polyuria NEUROLOGICAL: No headache, dizziness, syncope, paralysis, ataxia, numbness or tingling in the extremities. No change in bowel or bladder control.  MUSCULOSKELETAL: No muscle, back pain, joint pain or stiffness.  LYMPHATICS: No enlarged nodes. No history of splenectomy.  PSYCHIATRIC: No history of depression or anxiety.   ENDOCRINOLOGIC: No reports of sweating, cold or heat intolerance. No polyuria or polydipsia.  Marland Kitchen   Physical Examination p 59 bp 111/68 Wt 158 lbs BMI 26 Gen: resting comfortably, no acute distress HEENT: no scleral icterus, pupils equal round and reactive, no palptable cervical adenopathy,  CV: RRR, no m/r/g, no JVD, no carotid bruits Resp: Clear to auscultation bilaterally GI: abdomen is soft, non-tender, non-distended, normal bowel sounds, no hepatosplenomegaly MSK: extremities are warm, no edema.  Skin: warm, no rash Neuro:  no focal deficits Psych: appropriate affect   Diagnostic Studies 12/2010 Echo Study Conclusions  - Left ventricle: The cavity size was mildly reduced. Wall thickness was increased in a pattern of mild LVH. Systolic function was normal. The estimated ejection fraction was in the range of 60% to 65%. Wall motion was normal; there were no regional wall motion abnormalities. Doppler parameters are consistent with abnormal left ventricular relaxation (grade 1 diastolic dysfunction). - Aortic valve: Trileaflet; mildly calcified leaflets. - Mitral valve: Calcified annulus. Trivial regurgitation. - Left atrium: The atrium was at the upper limits of normal in size. - Tricuspid valve: Trivial regurgitation. - Pericardium, extracardiac: A trivial pericardial effusion was identified.  12/2010 Stress MPI  IMPRESSION: Low risk Lexiscan Myoview with concurrent ambulation. There were no diagnostic ST-segment changes, no sustained arrhythmias. Frequent atrial ectopy noted. Perfusion imaging is most consistent with breast attenuation affecting the anteroseptal region rather than scar, and there are no large areas of ischemia identified. LVEF is 69%.   11/2013 Lexiscan MPI [facility] LVEF 74%, normal wall motion. 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    Assessment and Plan  1. Chest pain -  multiple CAD risk factors including DM, HTN, HL. 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       Antoine Poche, M.D

## 2013-12-28 NOTE — Discharge Instructions (Signed)
Radial Site Care Refer to this sheet in the next few weeks. These instructions provide you with information on caring for yourself after your procedure. Your caregiver may also give you more specific instructions. Your treatment has been planned according to current medical practices, but problems sometimes occur. Call your caregiver if you have any problems or questions after your procedure. HOME CARE INSTRUCTIONS  You may shower the day after the procedure.Remove the bandage (dressing) and gently wash the site with plain soap and water.Gently pat the site dry.  Do not apply powder or lotion to the site.  Do not submerge the affected site in water for 3 to 5 days.  Inspect the site at least twice daily.  Do not flex or bend the affected arm for 24 hours.  No lifting over 5 pounds (2.3 kg) for 5 days after your procedure.  Do not drive home if you are discharged the same day of the procedure. Have someone else drive you.  You may drive 24 hours after the procedure unless otherwise instructed by your caregiver.  Do not operate machinery or power tools for 24 hours.  No Metformin for two days-   A responsible adult should be with you for the first 24 hours after you arrive home. What to expect:  Any bruising will usually fade within 1 to 2 weeks.  Blood that collects in the tissue (hematoma) may be painful to the touch. It should usually decrease in size and tenderness within 1 to 2 weeks. SEEK IMMEDIATE MEDICAL CARE IF:  You have unusual pain at the radial site.  You have redness, warmth, swelling, or pain at the radial site.  You have drainage (other than a small amount of blood on the dressing).  You have chills.  You have a fever or persistent symptoms for more than 72 hours.  You have a fever and your symptoms suddenly get worse.  Your arm becomes pale, cool, tingly, or numb.  You have heavy bleeding from the site. Hold pressure on the site. Document Released:  01/26/2010 Document Revised: 03/18/2011 Document Reviewed: 01/26/2010 [facility] Patient Information 2015 [place], [place]. This information is not intended to replace advice given to you by your health care provider. Make sure you discuss any questions you have with your health care provider.    Return To Work [employer] was treated at our facility. INJURY OR ILLNESS WAS: _____ Work-related __X___ Not work-related _____ Undetermined if work-related RETURN TO WORK  Employee may return to work YF:RTMYTR 01/03/14 WORK ACTIVITY RESTRICTIONS Work activities not tolerated include: _____ Bending _____ Prolonged sitting __X___ Lifting till 01/03/14 _____ Squatting _____ Prolonged standing _____ Climbing _____ Reaching __X___ Pushing and pulling till 01/03/14 _____ Walking _____ Other ____________________ Show this Return to Work statement to your supervisor at work as soon as possible. Your employer should be aware of your condition and can help with the necessary work activity restrictions. If you wish to return to work sooner than the date above, or if you have further problems which make it difficult for you to return at that time, please call us or your caregiver.

## 2014-02-08 DIAGNOSIS — F321 Major depressive disorder, single episode, moderate: Secondary | ICD-10-CM | POA: Diagnosis not present

## 2014-02-08 DIAGNOSIS — E780 Pure hypercholesterolemia: Secondary | ICD-10-CM | POA: Diagnosis not present

## 2014-02-08 DIAGNOSIS — K210 Gastro-esophageal reflux disease with esophagitis: Secondary | ICD-10-CM | POA: Diagnosis not present

## 2014-02-08 DIAGNOSIS — E119 Type 2 diabetes mellitus without complications: Secondary | ICD-10-CM | POA: Diagnosis not present

## 2014-02-08 DIAGNOSIS — I10 Essential (primary) hypertension: Secondary | ICD-10-CM | POA: Diagnosis not present

## 2014-02-16 ENCOUNTER — Telehealth: Payer: Self-pay | Admitting: Cardiology

## 2014-02-16 DIAGNOSIS — E780 Pure hypercholesterolemia: Secondary | ICD-10-CM | POA: Diagnosis not present

## 2014-02-16 DIAGNOSIS — Z23 Encounter for immunization: Secondary | ICD-10-CM | POA: Diagnosis not present

## 2014-02-16 DIAGNOSIS — I10 Essential (primary) hypertension: Secondary | ICD-10-CM | POA: Diagnosis not present

## 2014-02-16 DIAGNOSIS — E119 Type 2 diabetes mellitus without complications: Secondary | ICD-10-CM | POA: Diagnosis not present

## 2014-02-16 DIAGNOSIS — K210 Gastro-esophageal reflux disease with esophagitis: Secondary | ICD-10-CM | POA: Diagnosis not present

## 2014-02-16 DIAGNOSIS — F321 Major depressive disorder, single episode, moderate: Secondary | ICD-10-CM | POA: Diagnosis not present

## 2014-02-16 DIAGNOSIS — Z1389 Encounter for screening for other disorder: Secondary | ICD-10-CM | POA: Diagnosis not present

## 2014-02-16 NOTE — Telephone Encounter (Signed)
Will forward to Dr. Wyline Mood on when to schedule f/u appt. No appt was scheduled after d/c from cath lab.

## 2014-02-16 NOTE — Telephone Encounter (Signed)
Patient walked into clinic. Would like to know results of her Cath on 12/29/14.  Also wanting to know if it is still needed to have cath f/u

## 2014-02-17 NOTE — Telephone Encounter (Signed)
Patient may follow up with me in 1 month. Her cath showed only mild blockages, nothing significant to stent. We need to continue working with meds to keep the mild blockages from growing.  Dominga Ferry MD

## 2014-02-17 NOTE — Telephone Encounter (Signed)
Pt made aware, scheduled f/u appt for 3/1.

## 2014-03-03 DIAGNOSIS — Z1231 Encounter for screening mammogram for malignant neoplasm of breast: Secondary | ICD-10-CM | POA: Diagnosis not present

## 2014-03-08 ENCOUNTER — Ambulatory Visit (INDEPENDENT_AMBULATORY_CARE_PROVIDER_SITE_OTHER): Payer: Medicare Other | Admitting: Cardiology

## 2014-03-08 ENCOUNTER — Encounter: Payer: Self-pay | Admitting: Cardiology

## 2014-03-08 VITALS — BP 135/78 | HR 77 | Ht 65.0 in | Wt 164.8 lb

## 2014-03-08 DIAGNOSIS — R0789 Other chest pain: Secondary | ICD-10-CM

## 2014-03-08 NOTE — Progress Notes (Signed)
Clinical Summary Ms. Franca is a 63 y.o.female seen today for follow up of the following medical problems.   1. Chest pain - history of atypical chest pain. Prior notes mention chest pain going back as 2008. Based on clinic notes cath in 2008 showed diagonal disease per prior notes but otherwise patent vessels, cath report I do not see in our system. Workup in 2012 with nuclear stress and echo unremarkable.  - recently seen at [facility] for chest pain.  Eugenie Birks MPI at [facility] by pcp, per report multiple small perfusion defects concerning for multivessel disease. Normal LVEF.  - cath 12/2013 with mild to moderate non-obstructive disease in the LAD, otherwise patent vessels.    - continues to have some chest pain at times, but less severe than before. Mainly occurs at work associated with job stress.     Past Medical History  Diagnosis Date  . Diabetes mellitus   . Dyslipidemia   . Mitral valve prolapse   . GERD (gastroesophageal reflux disease)   . Peripheral neuropathy   . Depression   . Osteoarthritis   . CAD (coronary artery disease)     80% stenosis in moderate size diagonal on cath in 2004. with last adenosine Cardiolite study in 2007 showing no abnormalities  . Hypertension   . AV nodal tachycardia 2004    with radiofrequency ablation by Dr. Grant Fontana in 2004  . Hyperlipidemia      Allergies  Allergen Reactions  . Latex     Itching and rash     Current Outpatient Prescriptions  Medication Sig Dispense Refill  . aspirin EC 81 MG tablet Take 81 mg by mouth daily.      Marland Kitchen atorvastatin (LIPITOR) 20 MG tablet Take 20 mg by mouth at bedtime.    . Cholecalciferol (VITAMIN D3) 5000 UNITS CAPS Take 5,000 Units by mouth daily.     . citalopram (CELEXA) 40 MG tablet Take 40 mg by mouth daily.      Marland Kitchen FLUoxetine (PROZAC) 40 MG capsule Take 40 mg by mouth daily.    Marland Kitchen lisinopril-hydrochlorothiazide (PRINZIDE,ZESTORETIC) 20-12.5 MG per tablet Take 0.5 tablets by mouth  daily.      . metFORMIN (GLUCOPHAGE-XR) 500 MG 24 hr tablet Take 1 tablet (500 mg total) by mouth 2 (two) times daily.    . metoprolol succinate (TOPROL-XL) 25 MG 24 hr tablet Take 25 mg by mouth daily.      . Multiple Vitamins-Minerals (CENTRUM SILVER PO) Take 1 tablet by mouth daily.      . nitroGLYCERIN (NITROSTAT) 0.4 MG SL tablet Place 1 tablet (0.4 mg total) under the tongue every 5 (five) minutes as needed for chest pain. 25 tablet 3  . omeprazole (PRILOSEC) 20 MG capsule Take 20 mg by mouth daily.    Marland Kitchen oxybutynin (DITROPAN) 5 MG tablet Take 5 mg by mouth 3 (three) times daily.       No current facility-administered medications for this visit.     Past Surgical History  Procedure Laterality Date  . Cholecystectomy  1978  . Left breast biopsy      By Dr. Gabriel Cirri showed fibrocystic changes  . Cardiac catheterization  01/21/2002    80% stenosis of moderate sized diagonal Jamaya Sleeth  . Left heart catheterization with coronary angiogram N/A 12/28/2013    Procedure: LEFT HEART CATHETERIZATION WITH CORONARY ANGIOGRAM;  Surgeon: Corky Crafts, MD;  Location: [facility] CATH LAB;  Service: Cardiovascular;  Laterality: N/A;     Allergies  Allergen Reactions  . Latex     Itching and rash      Family History  Problem Relation Age of Onset  . Cancer Father   . Pneumonia Mother   . Cancer Brother     3 brothers died from cancer     Social History Ms. Lascola reports that she quit smoking about 26 years ago. Her smoking use included Cigarettes. She started smoking about 47 years ago. She has a 5.25 pack-year smoking history. She has never used smokeless tobacco. Ms. Zabaleta reports that she does not drink alcohol.   Review of Systems CONSTITUTIONAL: No weight loss, fever, chills, weakness or fatigue.  HEENT: Eyes: No visual loss, blurred vision, double vision or yellow sclerae.No hearing loss, sneezing, congestion, runny nose or sore throat.  SKIN: No rash or itching.    CARDIOVASCULAR: per HPI RESPIRATORY: No shortness of breath, cough or sputum.  GASTROINTESTINAL: No anorexia, nausea, vomiting or diarrhea. No abdominal pain or blood.  GENITOURINARY: No burning on urination, no polyuria NEUROLOGICAL: No headache, dizziness, syncope, paralysis, ataxia, numbness or tingling in the extremities. No change in bowel or bladder control.  MUSCULOSKELETAL: No muscle, back pain, joint pain or stiffness.  LYMPHATICS: No enlarged nodes. No history of splenectomy.  PSYCHIATRIC: No history of depression or anxiety.  ENDOCRINOLOGIC: No reports of sweating, cold or heat intolerance. No polyuria or polydipsia.  Marland Kitchen   Physical Examination p 77 bp 135/78 Wt 164 lbs BMI 27 Gen: resting comfortably, no acute distress HEENT: no scleral icterus, pupils equal round and reactive, no palptable cervical adenopathy,  CV: RRR, no m/r/g, no JVD, no carotid bruits Resp: Clear to auscultation bilaterally GI: abdomen is soft, non-tender, non-distended, normal bowel sounds, no hepatosplenomegaly MSK: extremities are warm, no edema.  Skin: warm, no rash Neuro:  no focal deficits Psych: appropriate affect   Diagnostic Studies 12/2010 Echo Study Conclusions  - Left ventricle: The cavity size was mildly reduced. Wall thickness was increased in a pattern of mild LVH. Systolic function was normal. The estimated ejection fraction was in the range of 60% to 65%. Wall motion was normal; there were no regional wall motion abnormalities. Doppler parameters are consistent with abnormal left ventricular relaxation (grade 1 diastolic dysfunction). - Aortic valve: Trileaflet; mildly calcified leaflets. - Mitral valve: Calcified annulus. Trivial regurgitation. - Left atrium: The atrium was at the upper limits of normal in size. - Tricuspid valve: Trivial regurgitation. - Pericardium, extracardiac: A trivial pericardial effusion was identified.  12/2010 Stress  MPI  IMPRESSION: Low risk Lexiscan Myoview with concurrent ambulation. There were no diagnostic ST-segment changes, no sustained arrhythmias. Frequent atrial ectopy noted. Perfusion imaging is most consistent with breast attenuation affecting the anteroseptal region rather than scar, and there are no large areas of ischemia identified. LVEF is 69%.   11/2013 Lexiscan MPI [facility] LVEF 74%, normal wall motion. 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   12/2013 Cath HEMODYNAMICS: Aortic pressure was 109/62; LV pressure was 111/4; LVEDP 11. There was no gradient between the left ventricle and aorta.   ANGIOGRAPHIC DATA: The left main coronary artery is widely patent.  The left anterior descending artery is a large vessel which reaches the apex. Just after the origin of a medium-sized second diagonal, there is a moderate 40% lesion. There is another focal moderate lesion just past this. The remainder the LAD is widely patent. The areas of moderate disease appear less significant in the cranial views. The third diagonal is medium-sized  and patent. The first diagonal is large and widely patent. The second diagonal is medium-sized and patent  The left circumflex artery is a large vessel. There is only minimal atherosclerosis. There is a large OM1 which is widely patent.  The right coronary artery is a large dominant vessel. There is minimal atherosclerosis in the RCA system. The posterior descending artery and posterior lateral arteries are widely patent.  LEFT VENTRICULOGRAM: Left ventricular angiogram was not done. LVEDP was 11 mmHg.  IMPRESSIONS:  1. Normal left main coronary artery. 2. Mild to moderate disease in the mid left anterior descending artery. 3. Widely patent left circumflex artery and its branches. 4. Widely patent right coronary artery. 5. Left ventricular systolic function not assessed. LVEDP 11  mmHg.  RECOMMENDATION: This appears to be a false positive stress test. There is certainly no evidence of multivessel disease. There is moderate single-vessel disease. We'll continue to manage the patient medically. If she had worsening symptoms, could consider FFR of the LAD, but I think that at this point based on the cranial views, FFR would likely be negative. Follow-up with Dr. Wyline Mood. Aggressive medical therapy.     Assessment and Plan  1. Atypical chest pain - long history of atypical chest pain with negative evaluations for ischemic heart disease - most recently abnormal MPI at [facility], sent for cath and found to have mild non-obstructive disease - continue risk factor modification.   F/u 6 months   Antoine Poche, M.D.

## 2014-03-08 NOTE — Patient Instructions (Signed)

## 2014-06-14 DIAGNOSIS — I10 Essential (primary) hypertension: Secondary | ICD-10-CM | POA: Diagnosis not present

## 2014-06-14 DIAGNOSIS — E780 Pure hypercholesterolemia: Secondary | ICD-10-CM | POA: Diagnosis not present

## 2014-06-14 DIAGNOSIS — E119 Type 2 diabetes mellitus without complications: Secondary | ICD-10-CM | POA: Diagnosis not present

## 2014-06-14 DIAGNOSIS — K210 Gastro-esophageal reflux disease with esophagitis: Secondary | ICD-10-CM | POA: Diagnosis not present

## 2014-06-21 DIAGNOSIS — F321 Major depressive disorder, single episode, moderate: Secondary | ICD-10-CM | POA: Diagnosis not present

## 2014-06-21 DIAGNOSIS — K210 Gastro-esophageal reflux disease with esophagitis: Secondary | ICD-10-CM | POA: Diagnosis not present

## 2014-06-21 DIAGNOSIS — E119 Type 2 diabetes mellitus without complications: Secondary | ICD-10-CM | POA: Diagnosis not present

## 2014-06-21 DIAGNOSIS — I10 Essential (primary) hypertension: Secondary | ICD-10-CM | POA: Diagnosis not present

## 2014-06-21 DIAGNOSIS — E780 Pure hypercholesterolemia: Secondary | ICD-10-CM | POA: Diagnosis not present

## 2014-10-12 DIAGNOSIS — K210 Gastro-esophageal reflux disease with esophagitis: Secondary | ICD-10-CM | POA: Diagnosis not present

## 2014-10-12 DIAGNOSIS — E119 Type 2 diabetes mellitus without complications: Secondary | ICD-10-CM | POA: Diagnosis not present

## 2014-10-12 DIAGNOSIS — E7800 Pure hypercholesterolemia, unspecified: Secondary | ICD-10-CM | POA: Diagnosis not present

## 2014-10-12 DIAGNOSIS — I10 Essential (primary) hypertension: Secondary | ICD-10-CM | POA: Diagnosis not present

## 2014-10-26 DIAGNOSIS — Z23 Encounter for immunization: Secondary | ICD-10-CM | POA: Diagnosis not present

## 2014-10-26 DIAGNOSIS — E119 Type 2 diabetes mellitus without complications: Secondary | ICD-10-CM | POA: Diagnosis not present

## 2014-10-26 DIAGNOSIS — I10 Essential (primary) hypertension: Secondary | ICD-10-CM | POA: Diagnosis not present

## 2014-10-26 DIAGNOSIS — K210 Gastro-esophageal reflux disease with esophagitis: Secondary | ICD-10-CM | POA: Diagnosis not present

## 2014-10-26 DIAGNOSIS — F321 Major depressive disorder, single episode, moderate: Secondary | ICD-10-CM | POA: Diagnosis not present

## 2014-11-29 DIAGNOSIS — Z1211 Encounter for screening for malignant neoplasm of colon: Secondary | ICD-10-CM | POA: Diagnosis not present

## 2014-11-29 DIAGNOSIS — I10 Essential (primary) hypertension: Secondary | ICD-10-CM | POA: Diagnosis not present

## 2014-11-29 DIAGNOSIS — E785 Hyperlipidemia, unspecified: Secondary | ICD-10-CM | POA: Diagnosis not present

## 2014-11-29 DIAGNOSIS — Z7982 Long term (current) use of aspirin: Secondary | ICD-10-CM | POA: Diagnosis not present

## 2014-11-29 DIAGNOSIS — Z91040 Latex allergy status: Secondary | ICD-10-CM | POA: Diagnosis not present

## 2014-11-29 DIAGNOSIS — F329 Major depressive disorder, single episode, unspecified: Secondary | ICD-10-CM | POA: Diagnosis not present

## 2014-11-29 DIAGNOSIS — Z79899 Other long term (current) drug therapy: Secondary | ICD-10-CM | POA: Diagnosis not present

## 2014-11-29 DIAGNOSIS — G629 Polyneuropathy, unspecified: Secondary | ICD-10-CM | POA: Diagnosis not present

## 2014-11-29 DIAGNOSIS — K219 Gastro-esophageal reflux disease without esophagitis: Secondary | ICD-10-CM | POA: Diagnosis not present

## 2014-11-29 DIAGNOSIS — E119 Type 2 diabetes mellitus without complications: Secondary | ICD-10-CM | POA: Diagnosis not present

## 2014-11-29 DIAGNOSIS — Z7984 Long term (current) use of oral hypoglycemic drugs: Secondary | ICD-10-CM | POA: Diagnosis not present

## 2014-11-29 DIAGNOSIS — Z87442 Personal history of urinary calculi: Secondary | ICD-10-CM | POA: Diagnosis not present

## 2014-11-29 DIAGNOSIS — K5730 Diverticulosis of large intestine without perforation or abscess without bleeding: Secondary | ICD-10-CM | POA: Diagnosis not present

## 2014-11-29 DIAGNOSIS — M1990 Unspecified osteoarthritis, unspecified site: Secondary | ICD-10-CM | POA: Diagnosis not present

## 2015-02-23 DIAGNOSIS — E7800 Pure hypercholesterolemia, unspecified: Secondary | ICD-10-CM | POA: Diagnosis not present

## 2015-02-23 DIAGNOSIS — K210 Gastro-esophageal reflux disease with esophagitis: Secondary | ICD-10-CM | POA: Diagnosis not present

## 2015-02-23 DIAGNOSIS — F321 Major depressive disorder, single episode, moderate: Secondary | ICD-10-CM | POA: Diagnosis not present

## 2015-02-23 DIAGNOSIS — I10 Essential (primary) hypertension: Secondary | ICD-10-CM | POA: Diagnosis not present

## 2015-02-23 DIAGNOSIS — E119 Type 2 diabetes mellitus without complications: Secondary | ICD-10-CM | POA: Diagnosis not present

## 2015-03-01 DIAGNOSIS — E119 Type 2 diabetes mellitus without complications: Secondary | ICD-10-CM | POA: Diagnosis not present

## 2015-03-01 DIAGNOSIS — K210 Gastro-esophageal reflux disease with esophagitis: Secondary | ICD-10-CM | POA: Diagnosis not present

## 2015-03-01 DIAGNOSIS — Z9119 Patient's noncompliance with other medical treatment and regimen: Secondary | ICD-10-CM | POA: Diagnosis not present

## 2015-03-01 DIAGNOSIS — I10 Essential (primary) hypertension: Secondary | ICD-10-CM | POA: Diagnosis not present

## 2015-06-27 DIAGNOSIS — I10 Essential (primary) hypertension: Secondary | ICD-10-CM | POA: Diagnosis not present

## 2015-06-27 DIAGNOSIS — F321 Major depressive disorder, single episode, moderate: Secondary | ICD-10-CM | POA: Diagnosis not present

## 2015-06-27 DIAGNOSIS — E7800 Pure hypercholesterolemia, unspecified: Secondary | ICD-10-CM | POA: Diagnosis not present

## 2015-06-27 DIAGNOSIS — K210 Gastro-esophageal reflux disease with esophagitis: Secondary | ICD-10-CM | POA: Diagnosis not present

## 2015-06-27 DIAGNOSIS — E119 Type 2 diabetes mellitus without complications: Secondary | ICD-10-CM | POA: Diagnosis not present

## 2015-07-04 DIAGNOSIS — N182 Chronic kidney disease, stage 2 (mild): Secondary | ICD-10-CM | POA: Diagnosis not present

## 2015-07-04 DIAGNOSIS — E782 Mixed hyperlipidemia: Secondary | ICD-10-CM | POA: Diagnosis not present

## 2015-07-04 DIAGNOSIS — I10 Essential (primary) hypertension: Secondary | ICD-10-CM | POA: Diagnosis not present

## 2015-07-04 DIAGNOSIS — R5383 Other fatigue: Secondary | ICD-10-CM | POA: Diagnosis not present

## 2015-07-04 DIAGNOSIS — F331 Major depressive disorder, recurrent, moderate: Secondary | ICD-10-CM | POA: Diagnosis not present

## 2015-07-04 DIAGNOSIS — M1990 Unspecified osteoarthritis, unspecified site: Secondary | ICD-10-CM | POA: Diagnosis not present

## 2015-07-04 DIAGNOSIS — K219 Gastro-esophageal reflux disease without esophagitis: Secondary | ICD-10-CM | POA: Diagnosis not present

## 2015-07-04 DIAGNOSIS — E1169 Type 2 diabetes mellitus with other specified complication: Secondary | ICD-10-CM | POA: Diagnosis not present

## 2015-09-04 ENCOUNTER — Other Ambulatory Visit: Payer: Self-pay

## 2015-11-13 DIAGNOSIS — E782 Mixed hyperlipidemia: Secondary | ICD-10-CM | POA: Diagnosis not present

## 2015-11-13 DIAGNOSIS — E7800 Pure hypercholesterolemia, unspecified: Secondary | ICD-10-CM | POA: Diagnosis not present

## 2015-11-13 DIAGNOSIS — F321 Major depressive disorder, single episode, moderate: Secondary | ICD-10-CM | POA: Diagnosis not present

## 2015-11-13 DIAGNOSIS — N182 Chronic kidney disease, stage 2 (mild): Secondary | ICD-10-CM | POA: Diagnosis not present

## 2015-11-13 DIAGNOSIS — E1169 Type 2 diabetes mellitus with other specified complication: Secondary | ICD-10-CM | POA: Diagnosis not present

## 2015-11-13 DIAGNOSIS — K210 Gastro-esophageal reflux disease with esophagitis: Secondary | ICD-10-CM | POA: Diagnosis not present

## 2015-11-13 DIAGNOSIS — I10 Essential (primary) hypertension: Secondary | ICD-10-CM | POA: Diagnosis not present

## 2015-11-15 DIAGNOSIS — M1990 Unspecified osteoarthritis, unspecified site: Secondary | ICD-10-CM | POA: Diagnosis not present

## 2015-11-15 DIAGNOSIS — Z6824 Body mass index (BMI) 24.0-24.9, adult: Secondary | ICD-10-CM | POA: Diagnosis not present

## 2015-11-15 DIAGNOSIS — Z23 Encounter for immunization: Secondary | ICD-10-CM | POA: Diagnosis not present

## 2015-11-15 DIAGNOSIS — R5383 Other fatigue: Secondary | ICD-10-CM | POA: Diagnosis not present

## 2015-11-15 DIAGNOSIS — R0602 Shortness of breath: Secondary | ICD-10-CM | POA: Diagnosis not present

## 2015-11-15 DIAGNOSIS — I10 Essential (primary) hypertension: Secondary | ICD-10-CM | POA: Diagnosis not present

## 2015-11-15 DIAGNOSIS — E782 Mixed hyperlipidemia: Secondary | ICD-10-CM | POA: Diagnosis not present

## 2015-11-15 DIAGNOSIS — R079 Chest pain, unspecified: Secondary | ICD-10-CM | POA: Diagnosis not present

## 2015-11-15 DIAGNOSIS — K219 Gastro-esophageal reflux disease without esophagitis: Secondary | ICD-10-CM | POA: Diagnosis not present

## 2015-11-15 DIAGNOSIS — F331 Major depressive disorder, recurrent, moderate: Secondary | ICD-10-CM | POA: Diagnosis not present

## 2015-11-15 DIAGNOSIS — R012 Other cardiac sounds: Secondary | ICD-10-CM | POA: Diagnosis not present

## 2015-11-16 ENCOUNTER — Encounter: Payer: Self-pay | Admitting: *Deleted

## 2015-11-28 DIAGNOSIS — Z1231 Encounter for screening mammogram for malignant neoplasm of breast: Secondary | ICD-10-CM | POA: Diagnosis not present

## 2015-12-06 ENCOUNTER — Ambulatory Visit (INDEPENDENT_AMBULATORY_CARE_PROVIDER_SITE_OTHER): Payer: Medicare Other | Admitting: Physician Assistant

## 2015-12-06 ENCOUNTER — Encounter: Payer: Self-pay | Admitting: Physician Assistant

## 2015-12-06 VITALS — BP 120/60 | HR 77 | Ht 64.0 in | Wt 140.0 lb

## 2015-12-06 DIAGNOSIS — I209 Angina pectoris, unspecified: Secondary | ICD-10-CM

## 2015-12-06 DIAGNOSIS — I499 Cardiac arrhythmia, unspecified: Secondary | ICD-10-CM

## 2015-12-06 DIAGNOSIS — R42 Dizziness and giddiness: Secondary | ICD-10-CM | POA: Diagnosis not present

## 2015-12-06 DIAGNOSIS — I10 Essential (primary) hypertension: Secondary | ICD-10-CM

## 2015-12-06 DIAGNOSIS — I498 Other specified cardiac arrhythmias: Secondary | ICD-10-CM

## 2015-12-06 DIAGNOSIS — I25119 Atherosclerotic heart disease of native coronary artery with unspecified angina pectoris: Secondary | ICD-10-CM

## 2015-12-06 MED ORDER — METOPROLOL SUCCINATE ER 50 MG PO TB24: 50.0000 mg | ORAL_TABLET | Freq: Every day | ORAL | 3 refills | Status: DC

## 2015-12-06 NOTE — Patient Instructions (Signed)
Your physician recommends that you schedule a follow-up appointment in: Dr. Wyline Mood 2 Weeks   Your physician has recommended you make the following change in your medication:  Increase Toprol XL to 50 mg Daily   Your physician has recommended that you wear a holter monitor. Holter monitors are medical devices that record the heart's electrical activity. Doctors most often use these monitors to diagnose arrhythmias. Arrhythmias are problems with the speed or rhythm of the heartbeat. The monitor is a small, portable device. You can wear one while you do your normal daily activities. This is usually used to diagnose what is causing palpitations/syncope (passing out).  Decrease Caffeine intake.   If you need a refill on your cardiac medications before your next appointment, please call your pharmacy.  Thank you for choosing [facility]!

## 2015-12-06 NOTE — Progress Notes (Signed)
Cardiology Office Note    Date:  12/06/2015   ID:  Shelby SchmidtDoris A Trevino, DOB 07-Jul-1950, MRN [id number]  [facility]  Cardiologist: Dr. Wyline MoodBranch   Chief Complaint  Patient presents with  . Dizziness  . Chest Pain    History of Present Illness:  Shelby Trevino is a 65 y.o. female with history of atypical chest pain with cath in 2008 showing diagonal disease but otherwise patent vessels. Nuclear stress test and echo in 2012 were unremarkable. [facility] multiple small perfusion defects concerning for multivessel disease normal LVEF. Cath in 2015 mild to moderate nonobstructive disease in the LAD otherwise patent vessels.  Last saw Dr. Wyline MoodBranch 03/2014 and doing well.Saw Dr. Garner Nashaniels on 11/15/15 and was complaining of some shortness of breath and intermittent chest pain with exertion. EKG was done which is difficult to read on transmission. She did have some ventricular bigeminy. Recommend follow-up.  She comes in today very stressed out. 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 She drinks 5-7 cups of coffee throughout the day to get her through. She says she occasionally feels right-sided chest pressure that is relieved with drinking a Coke and belching. She also has some dizziness and feels off balance. No presyncope. Her heart does skip some but she is not always aware of this. She's had decrease in her appetite and has lost 20-30 pounds. She becomes tearful and just wants to talk about the anxiety around her work. She also complains of headaches. Records reviewed in detail from Dr. Garner Nashaniels office. Lab work was stable except hemoglobin A1c was elevated at 6.8.    Past Medical History:  Diagnosis Date  . AV nodal tachycardia (HCC) 2004   with radiofrequency ablation by Dr. Grant Fontanaaylo in 2004  . CAD (coronary artery disease)    80% stenosis in moderate size diagonal on cath in 2004. with last adenosine  Cardiolite study in 2007 showing no abnormalities  . Depression   . Diabetes mellitus   . Dyslipidemia   . GERD (gastroesophageal reflux disease)   . Hyperlipidemia   . Hypertension   . Mitral valve prolapse   . Osteoarthritis   . Peripheral neuropathy [facility](HCC)     Past Surgical History:  Procedure Laterality Date  . CARDIAC CATHETERIZATION  01/21/2002   80% stenosis of moderate sized diagonal branch  . CHOLECYSTECTOMY  1978  . LEFT BREAST BIOPSY     By Dr. Gabriel CirrieMason showed fibrocystic changes  . LEFT HEART CATHETERIZATION WITH CORONARY ANGIOGRAM N/A 12/28/2013   Procedure: LEFT HEART CATHETERIZATION WITH CORONARY ANGIOGRAM;  Surgeon: Corky CraftsJayadeep S Varanasi, MD;  [facility];  Service: Cardiovascular;  Laterality: N/A;    Current Medications: Outpatient Medications Prior to Visit  Medication Sig Dispense Refill  . aspirin EC 81 MG tablet Take 81 mg by mouth daily.      Marland Kitchen. atorvastatin (LIPITOR) 20 MG tablet Take 20 mg by mouth at bedtime.    . Cholecalciferol (VITAMIN D3) 5000 UNITS CAPS Take 5,000 Units by mouth daily.     . citalopram (CELEXA) 40 MG tablet Take 40 mg by mouth daily.      Marland Kitchen. FLUoxetine (PROZAC) 40 MG capsule Take 40 mg by mouth daily.    Marland Kitchen. lisinopril-hydrochlorothiazide (PRINZIDE,ZESTORETIC) 20-12.5 MG per tablet Take 0.5 tablets by mouth daily.      . metFORMIN (GLUCOPHAGE-XR) 500 MG 24 hr tablet Take 1 tablet (500  mg total) by mouth 2 (two) times daily.    . Multiple Vitamins-Minerals (CENTRUM SILVER PO) Take 1 tablet by mouth daily.      . nitroGLYCERIN (NITROSTAT) 0.4 MG SL tablet Place 1 tablet (0.4 mg total) under the tongue every 5 (five) minutes as needed for chest pain. 25 tablet 3  . omeprazole (PRILOSEC) 20 MG capsule Take 20 mg by mouth daily.    Marland Kitchen oxybutynin (DITROPAN) 5 MG tablet Take 5 mg by mouth 3 (three) times daily.      . metoprolol succinate (TOPROL-XL) 25 MG 24 hr tablet Take 25 mg by mouth daily.       No facility-administered  medications prior to visit.      Allergies:   Latex   Social History   Social History  . Marital status: Divorced    Spouse name: N/A  . Number of children: N/A  . Years of education: N/A   Social History Main Topics  . Smoking status: Former Smoker    Packs/day: 0.25    Years: 21.00    Types: Cigarettes    Start date: 06/22/1966    Quit date: 07/23/1987  . Smokeless tobacco: Never Used  . Alcohol use No  . Drug use: No  . Sexual activity: Not Asked   Other Topics Concern  . None   Social History Narrative   Has one son and one daughter.   Has five grandchildren.   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 3 brothers died with cancer.     Family History:  The patient's  family history includes Cancer in her brother and father; Pneumonia in her mother.   ROS:   Please see the history of present illness.    Review of Systems  Constitution: Positive for decreased appetite and weight loss.  Eyes: Positive for visual disturbance.  Cardiovascular: Positive for dyspnea on exertion.  Gastrointestinal: Positive for diarrhea.  Neurological: Positive for dizziness, headaches, light-headedness and loss of balance.   All other systems reviewed and are negative.   PHYSICAL EXAM:   VS:  BP 120/60   Pulse 77   Ht 5\' 4"  (1.626 m)   Wt 140 lb (63.5 kg)   SpO2 98%   BMI 24.03 kg/m   Physical Exam  GEN: Well nourished, well developed, in no acute distress Neck: no JVD, carotid bruits, or masses Cardiac:RRR frequent skipping; no murmurs, rubs, or gallops  Respiratory:  clear to auscultation bilaterally, normal work of breathing GI: soft, nontender, nondistended, + BS Ext: without cyanosis, clubbing, or edema, Good distal pulses bilaterally MS: no deformity or atrophy Skin: warm and dry, no rash Neuro:  Alert and Oriented x 3, Strength and sensation are intact Psych: euthymic mood, full affect  Wt Readings from Last 3 Encounters:  12/06/15 140 lb  (63.5 kg)  03/08/14 164 lb 12.8 oz (74.8 kg)  12/28/13 159 lb (72.1 kg)      Studies/Labs Reviewed:   EKG:  EKG is  ordered today.  The ekg ordered today demonstrates Normal sinus rhythm with PVCs, bigeminy  Recent Labs: No results found for requested labs within last 8760 hours.   Lipid Panel No results found for: CHOL, TRIG, HDL, CHOLHDL, VLDL, LDLCALC, LDLDIRECT  Additional studies/ records that were reviewed today include:  12/2010 Echo Study Conclusions  - Left ventricle: The cavity size was mildly reduced. Wall   thickness was increased in a pattern of mild LVH. Systolic   function  was normal. The estimated ejection fraction was   in the range of 60% to 65%. Wall motion was normal; there   were no regional wall motion abnormalities. Doppler   parameters are consistent with abnormal left ventricular   relaxation (grade 1 diastolic dysfunction). - Aortic valve: Trileaflet; mildly calcified leaflets. - Mitral valve: Calcified annulus. Trivial regurgitation. - Left atrium: The atrium was at the upper limits of normal   in size. - Tricuspid valve: Trivial regurgitation. - Pericardium, extracardiac: A trivial pericardial effusion   was identified.   12/2010 Stress MPI   IMPRESSION: Low risk Lexiscan Myoview with concurrent ambulation.  There were no diagnostic ST-segment changes, no sustained arrhythmias. Frequent atrial ectopy noted.  Perfusion imaging is most consistent with breast attenuation affecting the anteroseptal region rather than scar, and there are no large areas of ischemia identified. LVEF is 69%.     11/2013 Lexiscan MPI [facility] LVEF 74%, normal wall motion. 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     12/2013 Cath HEMODYNAMICS:  Aortic pressure was 109/62; LV pressure was 111/4; LVEDP 11.  There was no gradient between the left ventricle and aorta.     ANGIOGRAPHIC DATA:   The left main coronary artery is  widely patent.   The left anterior descending artery is a large vessel which reaches the apex. Just after the origin of a medium-sized second diagonal, there is a moderate 40% lesion. There is another focal moderate lesion just past this. The remainder the LAD is widely patent. The areas of moderate disease appear less significant in the cranial views.  The third diagonal is medium-sized and patent. The first diagonal is large and widely patent.  The second diagonal is medium-sized and patent   The left circumflex artery is a large vessel. There is only minimal atherosclerosis. There is a large OM1 which is widely patent.   The right coronary artery is a large dominant vessel. There is minimal atherosclerosis in the RCA system. The posterior descending artery and posterior lateral arteries are widely patent.   LEFT VENTRICULOGRAM:  Left ventricular angiogram was not done.  LVEDP was 11 mmHg.   IMPRESSIONS:    1. Normal left main coronary artery. 2. Mild to moderate disease in the mid left anterior descending artery. 3. Widely patent left circumflex artery and its branches. 4. Widely patent right coronary artery. 5. Left ventricular systolic function not assessed.  LVEDP 11 mmHg.    RECOMMENDATION:  This appears to be a false positive stress test. There is certainly no evidence of multivessel disease. There is moderate single-vessel disease. We'll continue to manage the patient medically. If she had worsening symptoms, could consider FFR of the LAD, but I think that at this point based on the cranial views, FFR would likely be negative.  Follow-up with Dr. Wyline Mood. Aggressive medical therapy.          ASSESSMENT:    1. Bigeminal rhythm   2. Dizzy   3. Essential hypertension   4. Coronary artery disease involving native coronary artery of native heart with angina pectoris (HCC)      PLAN:  In order of problems listed above:  Patient is having Bigeminy and increased dizziness. She is  drinking a lot of caffeine. Recommend decrease caffeine intake and limited to 1-2 cups a day. Increase Toprol to 50 mg once daily. Wear a 48-hour monitor to rule out arrhythmia. Also under a lot of stress.  Essential hypertension controlled  CAD with  cath after abnormal stress test in 2015 really revealing 40% LAD otherwise nonobstructive disease. Her chest pain is usually related to stress and anxiety and relieved with drinking Coke and belching. We'll hold off on ischemic evaluation at this time. Await Holter monitor and increase metoprolol. Follow-up with Dr.Branch to see if any further testing needed.    Medication Adjustments/Labs and Tests Ordered: Current medicines are reviewed at length with the patient today.  Concerns regarding medicines are outlined above.  Medication changes, Labs and Tests ordered today are listed in the Patient Instructions below. Patient Instructions  Your physician recommends that you schedule a follow-up appointment in: Dr. Wyline Mood 2 Weeks   Your physician has recommended you make the following change in your medication:  Increase Toprol XL to 50 mg Daily   Your physician has recommended that you wear a holter monitor. Holter monitors are medical devices that record the heart's electrical activity. Doctors most often use these monitors to diagnose arrhythmias. Arrhythmias are problems with the speed or rhythm of the heartbeat. The monitor is a small, portable device. You can wear one while you do your normal daily activities. This is usually used to diagnose what is causing palpitations/syncope (passing out).  Decrease Caffeine intake.   If you need a refill on your cardiac medications before your next appointment, please call your pharmacy.  Thank you for choosing [facility]!        Elson Clan, PA-C  12/06/2015 12:38 PM    [facility] [address] [place], [place], [place]  [address] Phone: [phone]; Fax:  [phone]

## 2015-12-11 ENCOUNTER — Ambulatory Visit (HOSPITAL_COMMUNITY)
Admission: RE | Admit: 2015-12-11 | Discharge: 2015-12-11 | Disposition: A | Discharge status: Home / Self Care | Payer: Medicare Other | Source: Ambulatory Visit | Attending: Physician Assistant | Admitting: Physician Assistant

## 2015-12-11 DIAGNOSIS — I499 Cardiac arrhythmia, unspecified: Secondary | ICD-10-CM | POA: Insufficient documentation

## 2015-12-11 DIAGNOSIS — R42 Dizziness and giddiness: Secondary | ICD-10-CM | POA: Diagnosis not present

## 2015-12-12 DIAGNOSIS — F411 Generalized anxiety disorder: Secondary | ICD-10-CM | POA: Diagnosis not present

## 2015-12-12 DIAGNOSIS — R5383 Other fatigue: Secondary | ICD-10-CM | POA: Diagnosis not present

## 2015-12-12 DIAGNOSIS — Z6824 Body mass index (BMI) 24.0-24.9, adult: Secondary | ICD-10-CM | POA: Diagnosis not present

## 2015-12-19 ENCOUNTER — Telehealth: Payer: Self-pay

## 2015-12-19 MED ORDER — METOPROLOL SUCCINATE ER 50 MG PO TB24: ORAL_TABLET | ORAL | 3 refills | Status: DC

## 2015-12-19 NOTE — Telephone Encounter (Signed)
-----   Message from Dyann Kief, PA-C sent at 12/19/2015  3:01 PM EST ----- A lot of skipping on her monitor. Has she cut back on caffeine? Can take extra 1/2 of metoprolol if needed for palpitations. Please schedule an earlier f/u with dr. Wyline Mood than Feb that is currently scheduled

## 2015-12-19 NOTE — Telephone Encounter (Signed)
Pt aware,will reschedule to sooner apt,have messaged front desk staff

## 2015-12-25 ENCOUNTER — Ambulatory Visit: Payer: Medicare Other | Admitting: Cardiology

## 2016-01-12 ENCOUNTER — Ambulatory Visit: Payer: Medicare Other | Admitting: Cardiology

## 2016-01-12 NOTE — Progress Notes (Deleted)
Clinical Summary Shelby Trevino is a 66 y.o.female  1. Chest pain - history of atypical chest pain. Prior notes mention chest pain going back as 2008. Based on clinic notes cath in 2008 showed diagonal disease per prior notes but otherwise patent vessels, cath report I do not see in our system. Workup in 2012 with nuclear stress and echo unremarkable.  - recently seen at [facility] for chest pain.  Shelby Trevino MPI at [facility] by pcp, per report multiple small perfusion defects concerning for multivessel disease. Normal LVEF.  - cath 12/2013 with mild to moderate non-obstructive disease in the LAD, otherwise patent vessels.    - continues to have some chest pain at times, but less severe than before. Mainly occurs at work associated with job stress.  2. PVCs - high caffeine intake - 12/2015 monitor showed, moderate PACs and 8 beat run of atach. Moderate ventricular ectoyp at 9%, in form of PVCs and bigeminy.  - lopressor increased last visit.  Past Medical History:  Diagnosis Date  . AV nodal tachycardia (HCC) 2004   with radiofrequency ablation by Dr. Grant Fontana in 2004  . CAD (coronary artery disease)    80% stenosis in moderate size diagonal on cath in 2004. with last adenosine Cardiolite study in 2007 showing no abnormalities  . Depression   . Diabetes mellitus   . Dyslipidemia   . GERD (gastroesophageal reflux disease)   . Hyperlipidemia   . Hypertension   . Mitral valve prolapse   . Osteoarthritis   . Peripheral neuropathy (HCC)      Allergies  Allergen Reactions  . Latex     Itching and rash     Current Outpatient Prescriptions  Medication Sig Dispense Refill  . aspirin EC 81 MG tablet Take 81 mg by mouth daily.      Marland Kitchen atorvastatin (LIPITOR) 20 MG tablet Take 20 mg by mouth at bedtime.    . Cholecalciferol (VITAMIN D3) 5000 UNITS CAPS Take 5,000 Units by mouth daily.     . citalopram (CELEXA) 40 MG tablet Take 40 mg by mouth daily.      Marland Kitchen FLUoxetine (PROZAC) 40  MG capsule Take 40 mg by mouth daily.    Marland Kitchen lisinopril-hydrochlorothiazide (PRINZIDE,ZESTORETIC) 20-12.5 MG per tablet Take 0.5 tablets by mouth daily.      . metFORMIN (GLUCOPHAGE-XR) 500 MG 24 hr tablet Take 1 tablet (500 mg total) by mouth 2 (two) times daily.    . metoprolol succinate (TOPROL-XL) 50 MG 24 hr tablet May take extra 1/2 tablet or 25 mg for palpitations 120 tablet 3  . Multiple Vitamins-Minerals (CENTRUM SILVER PO) Take 1 tablet by mouth daily.      . nitroGLYCERIN (NITROSTAT) 0.4 MG SL tablet Place 1 tablet (0.4 mg total) under the tongue every 5 (five) minutes as needed for chest pain. 25 tablet 3  . omeprazole (PRILOSEC) 20 MG capsule Take 20 mg by mouth daily.    Marland Kitchen oxybutynin (DITROPAN) 5 MG tablet Take 5 mg by mouth 3 (three) times daily.       No current facility-administered medications for this visit.      Past Surgical History:  Procedure Laterality Date  . CARDIAC CATHETERIZATION  01/21/2002   80% stenosis of moderate sized diagonal Shelby Trevino  . CHOLECYSTECTOMY  1978  . LEFT BREAST BIOPSY     By Dr. Gabriel Cirri showed fibrocystic changes  . LEFT HEART CATHETERIZATION WITH CORONARY ANGIOGRAM N/A 12/28/2013   Procedure: LEFT HEART CATHETERIZATION WITH CORONARY  ANGIOGRAM;  Surgeon: Corky Crafts, MD;  Location[facility] CATH LAB;  Service: Cardiovascular;  Laterality: N/A;     Allergies  Allergen Reactions  . Latex     Itching and rash      Family History  Problem Relation Age of Onset  . Cancer Father   . Pneumonia Mother   . Cancer Brother     3 brothers died from cancer     Social History Shelby Trevino reports that she quit smoking about 28 years ago. Her smoking use included Cigarettes. She started smoking about 49 years ago. She has a 5.25 pack-year smoking history. She has never used smokeless tobacco. Shelby Trevino reports that she does not drink alcohol.   Review of Systems CONSTITUTIONAL: No weight loss, fever, chills, weakness or fatigue.    HEENT: Eyes: No visual loss, blurred vision, double vision or yellow sclerae.No hearing loss, sneezing, congestion, runny nose or sore throat.  SKIN: No rash or itching.  CARDIOVASCULAR:  RESPIRATORY: No shortness of breath, cough or sputum.  GASTROINTESTINAL: No anorexia, nausea, vomiting or diarrhea. No abdominal pain or blood.  GENITOURINARY: No burning on urination, no polyuria NEUROLOGICAL: No headache, dizziness, syncope, paralysis, ataxia, numbness or tingling in the extremities. No change in bowel or bladder control.  MUSCULOSKELETAL: No muscle, back pain, joint pain or stiffness.  LYMPHATICS: No enlarged nodes. No history of splenectomy.  PSYCHIATRIC: No history of depression or anxiety.  ENDOCRINOLOGIC: No reports of sweating, cold or heat intolerance. No polyuria or polydipsia.  Marland Kitchen   Physical Examination There were no vitals filed for this visit. There were no vitals filed for this visit.  Gen: resting comfortably, no acute distress HEENT: no scleral icterus, pupils equal round and reactive, no palptable cervical adenopathy,  CV Resp: Clear to auscultation bilaterally GI: abdomen is soft, non-tender, non-distended, normal bowel sounds, no hepatosplenomegaly MSK: extremities are warm, no edema.  Skin: warm, no rash Neuro:  no focal deficits Psych: appropriate affect   Diagnostic Studies  12/2010 Echo Study Conclusions  - Left ventricle: The cavity size was mildly reduced. Wall thickness was increased in a pattern of mild LVH. Systolic function was normal. The estimated ejection fraction was in the range of 60% to 65%. Wall motion was normal; there were no regional wall motion abnormalities. Doppler parameters are consistent with abnormal left ventricular relaxation (grade 1 diastolic dysfunction). - Aortic valve: Trileaflet; mildly calcified leaflets. - Mitral valve: Calcified annulus. Trivial regurgitation. - Left atrium: The atrium was at the upper  limits of normal in size. - Tricuspid valve: Trivial regurgitation. - Pericardium, extracardiac: A trivial pericardial effusion was identified.  12/2010 Stress MPI  IMPRESSION: Low risk Lexiscan Myoview with concurrent ambulation. There were no diagnostic ST-segment changes, no sustained arrhythmias. Frequent atrial ectopy noted. Perfusion imaging is most consistent with breast attenuation affecting the anteroseptal region rather than scar, and there are no large areas of ischemia identified. LVEF is 69%.   11/2013 Lexiscan MPI [facility] LVEF 74%, normal wall motion. 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   12/2013 Cath HEMODYNAMICS: Aortic pressure was 109/62; LV pressure was 111/4; LVEDP 11. There was no gradient between the left ventricle and aorta.   ANGIOGRAPHIC DATA: The left main coronary artery is widely patent.  The left anterior descending artery is a large vessel which reaches the apex. Just after the origin of a medium-sized second diagonal, there is a moderate 40% lesion. There is another focal moderate lesion just past this.  The remainder the LAD is widely patent. The areas of moderate disease appear less significant in the cranial views. The third diagonal is medium-sized and patent. The first diagonal is large and widely patent. The second diagonal is medium-sized and patent  The left circumflex artery is a large vessel. There is only minimal atherosclerosis. There is a large OM1 which is widely patent.  The right coronary artery is a large dominant vessel. There is minimal atherosclerosis in the RCA system. The posterior descending artery and posterior lateral arteries are widely patent.  LEFT VENTRICULOGRAM: Left ventricular angiogram was not done. LVEDP was 11 mmHg.  IMPRESSIONS:  1. Normal left main coronary artery. 2. Mild to moderate disease in the mid left anterior descending  artery. 3. Widely patent left circumflex artery and its branches. 4. Widely patent right coronary artery. 5. Left ventricular systolic function not assessed. LVEDP 11 mmHg.  RECOMMENDATION: This appears to be a false positive stress test. There is certainly no evidence of multivessel disease. There is moderate single-vessel disease. We'll continue to manage the patient medically. If she had worsening symptoms, could consider FFR of the LAD, but I think that at this point based on the cranial views, FFR would likely be negative. Follow-up with Dr. Wyline Mood. Aggressive medical therapy.   Assessment and Plan   1. Atypical chest pain - long history of atypical chest pain with negative evaluations for ischemic heart disease - most recently abnormal MPI at [facility], sent for cath and found to have mild non-obstructive disease - continue risk factor modification.   F/u 6 months     Antoine Poche, M.D

## 2016-01-17 ENCOUNTER — Encounter: Payer: Self-pay | Admitting: Physician Assistant

## 2016-01-17 ENCOUNTER — Ambulatory Visit (INDEPENDENT_AMBULATORY_CARE_PROVIDER_SITE_OTHER): Payer: Medicare Other | Admitting: Physician Assistant

## 2016-01-17 ENCOUNTER — Encounter: Payer: Self-pay | Admitting: *Deleted

## 2016-01-17 VITALS — BP 128/60 | HR 37 | Ht 64.0 in | Wt 140.0 lb

## 2016-01-17 DIAGNOSIS — I25119 Atherosclerotic heart disease of native coronary artery with unspecified angina pectoris: Secondary | ICD-10-CM | POA: Diagnosis not present

## 2016-01-17 DIAGNOSIS — I471 Supraventricular tachycardia: Secondary | ICD-10-CM

## 2016-01-17 DIAGNOSIS — I493 Ventricular premature depolarization: Secondary | ICD-10-CM | POA: Diagnosis not present

## 2016-01-17 DIAGNOSIS — I209 Angina pectoris, unspecified: Secondary | ICD-10-CM

## 2016-01-17 MED ORDER — METOPROLOL TARTRATE 25 MG PO TABS: 25.0000 mg | ORAL_TABLET | Freq: Two times a day (BID) | ORAL | 3 refills | Status: DC

## 2016-01-17 NOTE — Progress Notes (Signed)
Cardiology Office Note    Date:  01/17/2016   ID:  Shelby Trevino, DOB 1950-09-20, MRN [id number]  PCP:  [facility]  Cardiologist: Dr. Wyline Mood   Chief Complaint  Patient presents with  . Follow-up    History of Present Illness:  Shelby Trevino is a 66 y.o. female  with history of atypical chest pain with cath in 2008 showing diagonal disease but otherwise patent vessels. Nuclear stress test and echo in 2012 were unremarkable. Lexi scan at [facility] multiple small perfusion defects concerning for multivessel disease normal LVEF. Cath in 2015 mild to moderate nonobstructive disease in the LAD otherwise patent vessels.   Last saw Dr. Wyline Mood 03/2014 and doing well.Saw Dr. Garner Nash on 11/15/15 and was complaining of some shortness of breath and intermittent chest pain with exertion. EKG was done which is difficult to read on transmission. She did have some ventricular bigeminy. Recommend follow-up.  I saw her 12/06/15 and she was very stressed out. 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 She was drinking 5-7 cups of coffee daily. She was having right-sided chest pressure as well as dizziness and feeling off balance. She felt some heart skipping but usually not aware of it. She had lost 20-30 pounds because of decrease in appetite.  She had bigeminy. I increased Toprol to 50 mg daily asked her to cut back on her caffeine and ordered a 48 hour monitor. This showed moderate supraventricular ectopy primarily PACs with 8 beat run of atrial tachycardia. There was moderate ventricular ectopy 15,975 or 9%. Primarily in the form of isolated PVCs and bigeminy. No nonsustained V. tach. Minimum heart rate 43 maximum 93. Sinus bradycardia in the 40s noted in early a.m. hours presumably while sleeping.  Patient is here today for follow-up. She never increased her Toprol to 50 mg daily because the pharmacy told her it would  be over $100. She still is under a lot of stress at work and is very focused on talking about this. She did decrease her caffeine intake. She isn't having as much chest pain because she is rushing like she used to. If she does rash she will have some chest pressure. She also is complaining of extreme fatigue and she just wants to sleep.       Past Medical History:  Diagnosis Date  . AV nodal tachycardia (HCC) 2004   with radiofrequency ablation by Dr. Grant Fontana in 2004  . CAD (coronary artery disease)    80% stenosis in moderate size diagonal on cath in 2004. with last adenosine Cardiolite study in 2007 showing no abnormalities  . Depression   . Diabetes mellitus   . Dyslipidemia   . GERD (gastroesophageal reflux disease)   . Hyperlipidemia   . Hypertension   . Mitral valve prolapse   . Osteoarthritis   . Peripheral neuropathy [facility])     Past Surgical History:  Procedure Laterality Date  . CARDIAC CATHETERIZATION  01/21/2002   80% stenosis of moderate sized diagonal branch  . CHOLECYSTECTOMY  1978  . LEFT BREAST BIOPSY     By Dr. Gabriel Cirri showed fibrocystic changes  . LEFT HEART CATHETERIZATION WITH CORONARY ANGIOGRAM N/A 12/28/2013   Procedure: LEFT HEART CATHETERIZATION WITH CORONARY ANGIOGRAM;  Surgeon: Corky Crafts, MD;  Location[facility] CATH LAB;  Service: Cardiovascular;  Laterality: N/A;    Current Medications: Outpatient Medications Prior to Visit  Medication Sig Dispense Refill  .  aspirin EC 81 MG tablet Take 81 mg by mouth daily.      Marland Kitchen atorvastatin (LIPITOR) 20 MG tablet Take 20 mg by mouth at bedtime.    . Cholecalciferol (VITAMIN D3) 5000 UNITS CAPS Take 5,000 Units by mouth daily.     . citalopram (CELEXA) 40 MG tablet Take 40 mg by mouth daily.      Marland Kitchen FLUoxetine (PROZAC) 40 MG capsule Take 40 mg by mouth daily.    Marland Kitchen lisinopril-hydrochlorothiazide (PRINZIDE,ZESTORETIC) 20-12.5 MG per tablet Take 0.5 tablets by mouth daily.      . metFORMIN (GLUCOPHAGE-XR) 500 MG  24 hr tablet Take 1 tablet (500 mg total) by mouth 2 (two) times daily.    . Multiple Vitamins-Minerals (CENTRUM SILVER PO) Take 1 tablet by mouth daily.      . nitroGLYCERIN (NITROSTAT) 0.4 MG SL tablet Place 1 tablet (0.4 mg total) under the tongue every 5 (five) minutes as needed for chest pain. 25 tablet 3  . omeprazole (PRILOSEC) 20 MG capsule Take 20 mg by mouth daily.    Marland Kitchen oxybutynin (DITROPAN) 5 MG tablet Take 5 mg by mouth 3 (three) times daily.      . metoprolol succinate (TOPROL-XL) 50 MG 24 hr tablet May take extra 1/2 tablet or 25 mg for palpitations 120 tablet 3   No facility-administered medications prior to visit.      Allergies:   Latex   Social History   Social History  . Marital status: Divorced    Spouse name: N/A  . Number of children: N/A  . Years of education: N/A   Social History Main Topics  . Smoking status: Former Smoker    Packs/day: 0.25    Years: 21.00    Types: Cigarettes    Start date: 06/22/1966    Quit date: 07/23/1987  . Smokeless tobacco: Never Used  . Alcohol use No  . Drug use: No  . Sexual activity: Not Asked   Other Topics Concern  . None   Social History Narrative   Has one son and one daughter.   Has five grandchildren.   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 3 brothers died with cancer.     Family History:  The patient's family history includes Cancer in her brother and father; Pneumonia in her mother.   ROS:   Please see the history of present illness.    Review of Systems  Constitution: Positive for decreased appetite, weakness, malaise/fatigue and weight loss.  HENT: Negative.   Eyes: Negative.   Cardiovascular: Positive for chest pain, dyspnea on exertion, irregular heartbeat and palpitations.  Respiratory: Negative.   Hematologic/Lymphatic: Negative.   Musculoskeletal: Negative.  Negative for joint pain.  Gastrointestinal: Negative.   Genitourinary: Negative.   Psychiatric/Behavioral:  The patient is nervous/anxious.    All other systems reviewed and are negative.   PHYSICAL EXAM:   VS:  BP 128/60   Pulse (!) 37   Ht 5\' 4"  (1.626 m)   Wt 140 lb (63.5 kg)   SpO2 97%   BMI 24.03 kg/m   Physical Exam  GEN: Well nourished, well developed, in no acute distress  Neck: no JVD, carotid bruits, or masses Cardiac:RRR; 2/6 systolic murmur at the left sternal border, no rubs, or gallops  Respiratory:  clear to auscultation bilaterally, normal work of breathing GI: soft, nontender, nondistended, + BS Ext: without cyanosis, clubbing, or edema, Good distal pulses bilaterally Psych: Anxious  Wt Readings from  Last 3 Encounters:  01/17/16 140 lb (63.5 kg)  12/06/15 140 lb (63.5 kg)  03/08/14 164 lb 12.8 oz (74.8 kg)      Studies/Labs Reviewed:   EKG:  EKG is  ordered today.  The ekg ordered today demonstrates Normal sinus rhythm with bigeminy  Recent Labs: No results found for requested labs within last 8760 hours.   Lipid Panel No results found for: CHOL, TRIG, HDL, CHOLHDL, VLDL, LDLCALC, LDLDIRECT  Additional studies/ records that were reviewed today include:   48 hour monitor 12/2015 Study Highlights   Telemetry tracings show sinus rhythm  Moderate supraventricular ectopy, primarily in form of PACs. 8 beat run of atach.  Moderate ventricular ectopy (15975 or 9%). Primarily in the form of isolated PVCs and bigeminy. No NSVT or sustained VT.  Min HR 43, Max HR 93, Avg HR 63  Sinus bradycardia to mid 40s noted in early AM hours presumably while sleeping.  No diary submitted   12/2010 Echo Study Conclusions  - Left ventricle: The cavity size was mildly reduced. Wall   thickness was increased in a pattern of mild LVH. Systolic   function was normal. The estimated ejection fraction was   in the range of 60% to 65%. Wall motion was normal; there   were no regional wall motion abnormalities. Doppler   parameters are consistent with abnormal left  ventricular   relaxation (grade 1 diastolic dysfunction). - Aortic valve: Trileaflet; mildly calcified leaflets. - Mitral valve: Calcified annulus. Trivial regurgitation. - Left atrium: The atrium was at the upper limits of normal   in size. - Tricuspid valve: Trivial regurgitation. - Pericardium, extracardiac: A trivial pericardial effusion   was identified.   12/2010 Stress MPI   IMPRESSION: Low risk Lexiscan Myoview with concurrent ambulation.  There were no diagnostic ST-segment changes, no sustained arrhythmias. Frequent atrial ectopy noted.  Perfusion imaging is most consistent with breast attenuation affecting the anteroseptal region rather than scar, and there are no large areas of ischemia identified. LVEF is 69%.     11/2013 Lexiscan [facility] LVEF 74%, normal wall motion. 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     12/2013 Cath HEMODYNAMICS:  Aortic pressure was 109/62; LV pressure was 111/4; LVEDP 11.  There was no gradient between the left ventricle and aorta.     ANGIOGRAPHIC DATA:   The left main coronary artery is widely patent.   The left anterior descending artery is a large vessel which reaches the apex. Just after the origin of a medium-sized second diagonal, there is a moderate 40% lesion. There is another focal moderate lesion just past this. The remainder the LAD is widely patent. The areas of moderate disease appear less significant in the cranial views.  The third diagonal is medium-sized and patent. The first diagonal is large and widely patent.  The second diagonal is medium-sized and patent   The left circumflex artery is a large vessel. There is only minimal atherosclerosis. There is a large OM1 which is widely patent.   The right coronary artery is a large dominant vessel. There is minimal atherosclerosis in the RCA system. The posterior descending artery and posterior lateral arteries are widely patent.    LEFT VENTRICULOGRAM:  Left ventricular angiogram was not done.  LVEDP was 11 mmHg.   IMPRESSIONS:   1. Normal left main coronary artery. 2. Mild to moderate disease in the mid left anterior descending artery. 3. Widely patent left circumflex artery and its branches.  4. Widely patent right coronary artery. 5. Left ventricular systolic function not assessed.  LVEDP 11 mmHg.    RECOMMENDATION:  This appears to be a false positive stress test. There is certainly no evidence of multivessel disease. There is moderate single-vessel disease. We'll continue to manage the patient medically. If she had worsening symptoms, could consider FFR of the LAD, but I think that at this point based on the cranial views, FFR would likely be negative.  Follow-up with Dr. Wyline Mood. Aggressive medical therapy.         ASSESSMENT:    1. AV nodal tachycardia (HCC)   2. Frequent PVCs   3. Coronary artery disease involving native coronary artery of native heart with angina pectoris (HCC)      PLAN:  In order of problems listed above:  AV nodal tachycardia and frequent PVCs with bigeminy 9% on recent 48 hour monitor. Toprol was increased and she was asked to decrease her caffeine intake.Patient never increased her Toprol but did cut back on her caffeine. Because of cost we will switch her to metoprolol tartrate 25 mg twice a day.  CAD with cath after abnormal stress test in 2015 revealing 40% LAD otherwise nonobstructive disease. Patient does have exertional chest pain and with her PVC burden recommend Lexi scan Myoview. Follow-up with Dr. Wyline Mood in 2-4 weeks.    Medication Adjustments/Labs and Tests Ordered: Current medicines are reviewed at length with the patient today.  Concerns regarding medicines are outlined above.  Medication changes, Labs and Tests ordered today are listed in the Patient Instructions below. Patient Instructions  Your physician recommends that you schedule a follow-up appointment with  Dr. Wyline Mood  Your physician recommends that you continue on your current medications as directed. Please refer to the Current Medication list given to you today. Lopressor 25 mg Take one Tablet Two Times Daily   Your physician has requested that you have a lexiscan myoview. For further information please visit [email or web address] Please follow instruction sheet, as given.  If you need a refill on your cardiac medications before your next appointment, please call your pharmacy.  Thank you for choosing [facility]!       Signed, Jacolyn Reedy, PA-C  01/17/2016 2:28 PM    [facility] [address] [place], [place], [place]  [address] Phone: [phone]; Fax: [phone]

## 2016-01-17 NOTE — Patient Instructions (Signed)
Your physician recommends that you schedule a follow-up appointment with Dr. Wyline Mood  Your physician recommends that you continue on your current medications as directed. Please refer to the Current Medication list given to you today. Lopressor 25 mg Take one Tablet Two Times Daily   Your physician has requested that you have a lexiscan myoview. For further information please visit [email or web address] Please follow instruction sheet, as given.  If you need a refill on your cardiac medications before your next appointment, please call your pharmacy.  Thank you for choosing [facility]!

## 2016-01-18 NOTE — Addendum Note (Signed)
Addended by: Kerney Elbe on: 01/18/2016 09:22 AM   Modules accepted: Orders

## 2016-01-22 ENCOUNTER — Encounter (HOSPITAL_COMMUNITY)
Admission: RE | Admit: 2016-01-22 | Discharge: 2016-01-22 | Disposition: A | Discharge status: Home / Self Care | Payer: Medicare Other | Source: Ambulatory Visit | Attending: Physician Assistant | Admitting: Physician Assistant

## 2016-01-22 ENCOUNTER — Inpatient Hospital Stay (HOSPITAL_COMMUNITY): Admission: RE | Admit: 2016-01-22 | Payer: PRIVATE HEALTH INSURANCE | Source: Ambulatory Visit

## 2016-01-22 ENCOUNTER — Encounter (HOSPITAL_COMMUNITY): Payer: Self-pay

## 2016-01-22 DIAGNOSIS — I25119 Atherosclerotic heart disease of native coronary artery with unspecified angina pectoris: Secondary | ICD-10-CM | POA: Insufficient documentation

## 2016-01-22 DIAGNOSIS — I493 Ventricular premature depolarization: Secondary | ICD-10-CM | POA: Diagnosis not present

## 2016-01-22 DIAGNOSIS — I471 Supraventricular tachycardia: Secondary | ICD-10-CM | POA: Diagnosis not present

## 2016-01-22 HISTORY — DX: Disorder of kidney and ureter, unspecified: N28.9

## 2016-01-22 LAB — NM MYOCAR MULTI W/SPECT W/WALL MOTION / EF
CHL CUP NUCLEAR SSS: 0
LV dias vol: 61 mL (ref 46–106)
LV sys vol: 24 mL
NUC STRESS TID: 0.86
Peak HR: 83 {beats}/min
RATE: 0
Rest HR: 60 {beats}/min
SDS: 0
SRS: 0

## 2016-01-22 MED ORDER — TECHNETIUM TC 99M TETROFOSMIN IV KIT
10.0000 | PACK | Freq: Once | INTRAVENOUS | Status: AC | PRN
  Administered 2016-01-22: 10.8 via INTRAVENOUS

## 2016-01-22 MED ORDER — TECHNETIUM TC 99M TETROFOSMIN IV KIT
30.0000 | PACK | Freq: Once | INTRAVENOUS | Status: AC | PRN
  Administered 2016-01-22: 30.1 via INTRAVENOUS

## 2016-01-22 MED ORDER — REGADENOSON 0.4 MG/5ML IV SOLN
INTRAVENOUS | Status: AC
  Administered 2016-01-22: 0.4 mg via INTRAVENOUS
  Filled 2016-01-22: qty 5

## 2016-01-22 MED ORDER — SODIUM CHLORIDE 0.9% FLUSH
INTRAVENOUS | Status: AC
  Administered 2016-01-22: 10 mL via INTRAVENOUS
  Filled 2016-01-22: qty 10

## 2016-01-23 ENCOUNTER — Telehealth: Payer: Self-pay | Admitting: *Deleted

## 2016-01-23 NOTE — Telephone Encounter (Signed)
-----   Message from Dyann Kief, PA-C sent at 01/23/2016  8:52 AM EST ----- Normal nuclear stress test

## 2016-01-23 NOTE — Telephone Encounter (Signed)
Called patient with test results. No answer. Left message to call back.  

## 2016-02-27 ENCOUNTER — Ambulatory Visit: Payer: Medicare Other | Admitting: Cardiology

## 2016-03-01 ENCOUNTER — Ambulatory Visit (INDEPENDENT_AMBULATORY_CARE_PROVIDER_SITE_OTHER): Payer: Medicare Other | Admitting: Cardiology

## 2016-03-01 ENCOUNTER — Encounter: Payer: Self-pay | Admitting: Cardiology

## 2016-03-01 ENCOUNTER — Other Ambulatory Visit (HOSPITAL_COMMUNITY)
Admission: RE | Admit: 2016-03-01 | Discharge: 2016-03-01 | Disposition: A | Discharge status: Home / Self Care | Payer: Medicare Other | Source: Ambulatory Visit | Attending: Cardiology | Admitting: Cardiology

## 2016-03-01 VITALS — BP 124/60 | HR 40 | Ht 66.0 in | Wt 139.0 lb

## 2016-03-01 DIAGNOSIS — I25119 Atherosclerotic heart disease of native coronary artery with unspecified angina pectoris: Secondary | ICD-10-CM

## 2016-03-01 DIAGNOSIS — R0789 Other chest pain: Secondary | ICD-10-CM | POA: Diagnosis not present

## 2016-03-01 DIAGNOSIS — R002 Palpitations: Secondary | ICD-10-CM

## 2016-03-01 LAB — BASIC METABOLIC PANEL
ANION GAP: 8 (ref 5–15)
BUN: 12 mg/dL (ref 6–20)
CALCIUM: 9.1 mg/dL (ref 8.9–10.3)
CO2: 28 mmol/L (ref 22–32)
Chloride: 102 mmol/L (ref 101–111)
Creatinine, Ser: 0.75 mg/dL (ref 0.44–1.00)
GLUCOSE: 100 mg/dL — AB (ref 65–99)
POTASSIUM: 3.8 mmol/L (ref 3.5–5.1)
SODIUM: 138 mmol/L (ref 135–145)

## 2016-03-01 LAB — CBC
HCT: 35.5 % — ABNORMAL LOW (ref 36.0–46.0)
Hemoglobin: 11.8 g/dL — ABNORMAL LOW (ref 12.0–15.0)
MCH: 29 pg (ref 26.0–34.0)
MCHC: 33.2 g/dL (ref 30.0–36.0)
MCV: 87.2 fL (ref 78.0–100.0)
PLATELETS: 299 10*3/uL (ref 150–400)
RBC: 4.07 MIL/uL (ref 3.87–5.11)
RDW: 14.4 % (ref 11.5–15.5)
WBC: 5.7 10*3/uL (ref 4.0–10.5)

## 2016-03-01 LAB — TSH: TSH: 0.764 u[IU]/mL (ref 0.350–4.500)

## 2016-03-01 NOTE — Patient Instructions (Signed)
Your physician recommends that you schedule a follow-up appointment in: 3 months  Dr Wyline Mood    Get lab work today  Cbc,bmet,tsh      Your physician recommends that you continue on your current medications as directed. Please refer to the Current Medication list given to you today.      Thank you for choosing [facility] !

## 2016-03-01 NOTE — Progress Notes (Signed)
Clinical Summary Ms. Schrag is a 66 y.o.female seen today for follow up of the following medical problems.   1. Chest pain - history of atypical chest pain. Prior notes mention chest pain going back as 2008. Based on clinic notes cath in 2008 showed diagonal disease  but otherwise patent vessels, cath report I do not see in our system. Workup in 2012 with nuclear stress and echo unremarkable.  - recently seen at [facility] for chest pain.  Eugenie Birks MPI at [facility] by pcp, per report multiple small perfusion defects concerning for multivessel disease. Normal LVEF.  - cath 12/2013 with mild to moderate non-obstructive disease in the LAD, otherwise patent vessels.  - Lexiscan Jan 2018 no ischemia  - continues to have some chest pain at times, but less severe than before. Mainly occurs at work associated with job stress which she states is very high lately.   2. Tachcyardia - 12/2015 with supraventricular and ventricular ectopy as described below. 9% PVC burden - 01/2016 lexiscan no ischemia.  - occasional palpitations.  - mainly drinks coffee, otherwise poor oral intake.   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   Past Medical History:  Diagnosis Date  . AV nodal tachycardia (HCC) 2004   with radiofrequency ablation by Dr. Grant Fontana in 2004  . CAD (coronary artery disease)    80% stenosis in moderate size diagonal on cath in 2004. with last adenosine Cardiolite study in 2007 showing no abnormalities  . Depression   . Diabetes mellitus   . Dyslipidemia   . GERD (gastroesophageal reflux disease)   . Hyperlipidemia   . Hypertension   . Mitral valve prolapse   . Osteoarthritis   . Peripheral neuropathy (HCC)   . Renal insufficiency      Allergies  Allergen Reactions  . Latex     Itching and rash     Current Outpatient Prescriptions  Medication Sig Dispense Refill  . aspirin EC 81 MG tablet Take 81 mg by mouth daily.      Marland Kitchen  atorvastatin (LIPITOR) 20 MG tablet Take 20 mg by mouth at bedtime.    . Cholecalciferol (VITAMIN D3) 5000 UNITS CAPS Take 5,000 Units by mouth daily.     . citalopram (CELEXA) 40 MG tablet Take 40 mg by mouth daily.      Marland Kitchen FLUoxetine (PROZAC) 40 MG capsule Take 40 mg by mouth daily.    Marland Kitchen lisinopril-hydrochlorothiazide (PRINZIDE,ZESTORETIC) 20-12.5 MG per tablet Take 0.5 tablets by mouth daily.      . metFORMIN (GLUCOPHAGE-XR) 500 MG 24 hr tablet Take 1 tablet (500 mg total) by mouth 2 (two) times daily.    . metoprolol tartrate (LOPRESSOR) 25 MG tablet Take 1 tablet (25 mg total) by mouth 2 (two) times daily. 180 tablet 3  . Multiple Vitamins-Minerals (CENTRUM SILVER PO) Take 1 tablet by mouth daily.      . nitroGLYCERIN (NITROSTAT) 0.4 MG SL tablet Place 1 tablet (0.4 mg total) under the tongue every 5 (five) minutes as needed for chest pain. 25 tablet 3  . omeprazole (PRILOSEC) 20 MG capsule Take 20 mg by mouth daily.    Marland Kitchen oxybutynin (DITROPAN) 5 MG tablet Take 5 mg by mouth 3 (three) times daily.       No current facility-administered medications for this visit.      Past Surgical History:  Procedure Laterality Date  . CARDIAC CATHETERIZATION  01/21/2002   80% stenosis of  moderate sized diagonal Neesha Langton  . CHOLECYSTECTOMY  1978  . LEFT BREAST BIOPSY     By Dr. Gabriel Cirri showed fibrocystic changes  . LEFT HEART CATHETERIZATION WITH CORONARY ANGIOGRAM N/A 12/28/2013   Procedure: LEFT HEART CATHETERIZATION WITH CORONARY ANGIOGRAM;  Surgeon: Corky Crafts, MD;  Location: [facility] CATH LAB;  Service: Cardiovascular;  Laterality: N/A;     Allergies  Allergen Reactions  . Latex     Itching and rash      Family History  Problem Relation Age of Onset  . Cancer Father   . Pneumonia Mother   . Cancer Brother     3 brothers died from cancer     Social History Ms. Arcia reports that she quit smoking about 28 years ago. Her smoking use included Cigarettes. She started smoking  about 49 years ago. She has a 5.25 pack-year smoking history. She has never used smokeless tobacco. Ms. Sublett reports that she does not drink alcohol.   Review of Systems CONSTITUTIONAL: No weight loss, fever, chills, weakness or fatigue.  HEENT: Eyes: No visual loss, blurred vision, double vision or yellow sclerae.No hearing loss, sneezing, congestion, runny nose or sore throat.  SKIN: No rash or itching.  CARDIOVASCULAR: per HPI RESPIRATORY: No shortness of breath, cough or sputum.  GASTROINTESTINAL: No anorexia, nausea, vomiting or diarrhea. No abdominal pain or blood.  GENITOURINARY: No burning on urination, no polyuria NEUROLOGICAL: No headache, dizziness, syncope, paralysis, ataxia, numbness or tingling in the extremities. No change in bowel or bladder control.  MUSCULOSKELETAL: No muscle, back pain, joint pain or stiffness.  LYMPHATICS: No enlarged nodes. No history of splenectomy.  PSYCHIATRIC: No history of depression or anxiety.  ENDOCRINOLOGIC: No reports of sweating, cold or heat intolerance. No polyuria or polydipsia.  Marland Kitchen   Physical Examination Vitals:   03/01/16 1459  BP: 124/60  Pulse: (!) 40   Vitals:   03/01/16 1459  Weight: 139 lb (63 kg)  Height: 5\' 6"  (1.676 m)    Gen: resting comfortably, no acute distress HEENT: no scleral icterus, pupils equal round and reactive, no palptable cervical adenopathy,  CV: RRR, no m/r/g, no jvd Resp: Clear to auscultation bilaterally GI: abdomen is soft, non-tender, non-distended, normal bowel sounds, no hepatosplenomegaly MSK: extremities are warm, no edema.  Skin: warm, no rash Neuro:  no focal deficits Psych: appropriate affect   Diagnostic Studies 12/2010 Echo Study Conclusions  - Left ventricle: The cavity size was mildly reduced. Wall thickness was increased in a pattern of mild LVH. Systolic function was normal. The estimated ejection fraction was in the range of 60% to 65%. Wall motion was normal;  there were no regional wall motion abnormalities. Doppler parameters are consistent with abnormal left ventricular relaxation (grade 1 diastolic dysfunction). - Aortic valve: Trileaflet; mildly calcified leaflets. - Mitral valve: Calcified annulus. Trivial regurgitation. - Left atrium: The atrium was at the upper limits of normal in size. - Tricuspid valve: Trivial regurgitation. - Pericardium, extracardiac: A trivial pericardial effusion was identified.  12/2010 Stress MPI  IMPRESSION: Low risk Lexiscan Myoview with concurrent ambulation. There were no diagnostic ST-segment changes, no sustained arrhythmias. Frequent atrial ectopy noted. Perfusion imaging is most consistent with breast attenuation affecting the anteroseptal region rather than scar, and there are no large areas of ischemia identified. LVEF is 69%.   11/2013 Lexiscan MPI [facility] LVEF 74%, normal wall motion. 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   12/2013 Cath HEMODYNAMICS: Aortic pressure was 109/62; LV pressure was  111/4; LVEDP 11. There was no gradient between the left ventricle and aorta.   ANGIOGRAPHIC DATA: The left main coronary artery is widely patent.  The left anterior descending artery is a large vessel which reaches the apex. Just after the origin of a medium-sized second diagonal, there is a moderate 40% lesion. There is another focal moderate lesion just past this. The remainder the LAD is widely patent. The areas of moderate disease appear less significant in the cranial views. The third diagonal is medium-sized and patent. The first diagonal is large and widely patent. The second diagonal is medium-sized and patent  The left circumflex artery is a large vessel. There is only minimal atherosclerosis. There is a large OM1 which is widely patent.  The right coronary artery is a large dominant vessel. There is minimal  atherosclerosis in the RCA system. The posterior descending artery and posterior lateral arteries are widely patent.  LEFT VENTRICULOGRAM: Left ventricular angiogram was not done. LVEDP was 11 mmHg.  IMPRESSIONS:  1. Normal left main coronary artery. 2. Mild to moderate disease in the mid left anterior descending artery. 3. Widely patent left circumflex artery and its branches. 4. Widely patent right coronary artery. 5. Left ventricular systolic function not assessed. LVEDP 11 mmHg.  RECOMMENDATION: This appears to be a false positive stress test. There is certainly no evidence of multivessel disease. There is moderate single-vessel disease. We'll continue to manage the patient medically. If she had worsening symptoms, could consider FFR of the LAD, but I think that at this point based on the cranial views, FFR would likely be negative. Follow-up with Dr. Wyline Mood. Aggressive medical therapy.   12/2015 Holter  Telemetry tracings show sinus rhythm  Moderate supraventricular ectopy, primarily in form of PACs. 8 beat run of atach.  Moderate ventricular ectopy (15975 or 9%). Primarily in the form of isolated PVCs and bigeminy. No NSVT or sustained VT.  Min HR 43, Max HR 93, Avg HR 63  Sinus bradycardia to mid 40s noted in early AM hours presumably while sleeping.  No diary submitted  Assessment and Plan  1. Atypical chest pain - long history of atypical chest pain with negative evaluations for ischemic heart disease, including most recently normal nuclear stress test Jan 2018 -continue to monitor at this time  2. Tachycardia - holter monitor with supraventricular and ventricular ectopy, sustatined arrhythmias though did have short run of atach - very high caffeine intake, counseled to cut back. Her low heart rates by dynamap are misleading as it does not detect her ectopic beats. Recent monitor with avg HR in 60s, some bradycardia but mainly in early AM hours.    F/u 6  months      Antoine Poche, M.D.

## 2016-03-08 DIAGNOSIS — R5383 Other fatigue: Secondary | ICD-10-CM | POA: Diagnosis not present

## 2016-03-08 DIAGNOSIS — I10 Essential (primary) hypertension: Secondary | ICD-10-CM | POA: Diagnosis not present

## 2016-03-08 DIAGNOSIS — K210 Gastro-esophageal reflux disease with esophagitis: Secondary | ICD-10-CM | POA: Diagnosis not present

## 2016-03-08 DIAGNOSIS — E782 Mixed hyperlipidemia: Secondary | ICD-10-CM | POA: Diagnosis not present

## 2016-03-08 DIAGNOSIS — N182 Chronic kidney disease, stage 2 (mild): Secondary | ICD-10-CM | POA: Diagnosis not present

## 2016-03-08 DIAGNOSIS — E119 Type 2 diabetes mellitus without complications: Secondary | ICD-10-CM | POA: Diagnosis not present

## 2016-05-30 ENCOUNTER — Ambulatory Visit: Payer: Medicare Other | Admitting: Cardiology

## 2016-05-30 ENCOUNTER — Encounter: Payer: Self-pay | Admitting: Cardiology

## 2016-05-30 NOTE — Progress Notes (Deleted)
Clinical Summary Shelby Trevino is a 66 y.o.female seen today for follow up of the following medical problems.   1. Chest pain - history of atypical chest pain. Prior notes mention chest pain going back as 2008. Based on clinic notes cath in 2008 showed diagonal disease  but otherwise patent vessels, cath report I do not see in our system. Workup in 2012 with nuclear stress and echo unremarkable.  - recently seen at [facility] for chest pain.  Eugenie Birks MPI at [facility] by pcp, per report multiple small perfusion defects concerning for multivessel disease. Normal LVEF.  - cath 12/2013 with mild to moderate non-obstructive disease in the LAD, otherwise patent vessels.  - Lexiscan Jan 2018 no ischemia  - continues to have some chest pain at times, but less severe than before. Mainly occurs at work associated with job stress which she states is very high lately.   2. Tachcyardia - 12/2015 with supraventricular and ventricular ectopy as described below. 9% PVC burden - 01/2016 lexiscan no ischemia.  - occasional palpitations.  - mainly drinks coffee, otherwise poor oral intake.   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     Past Medical History:  Diagnosis Date  . AV nodal tachycardia (HCC) 2004   with radiofrequency ablation by Dr. Grant Fontana in 2004  . CAD (coronary artery disease)    80% stenosis in moderate size diagonal on cath in 2004. with last adenosine Cardiolite study in 2007 showing no abnormalities  . Depression   . Diabetes mellitus   . Dyslipidemia   . GERD (gastroesophageal reflux disease)   . Hyperlipidemia   . Hypertension   . Mitral valve prolapse   . Osteoarthritis   . Peripheral neuropathy (HCC)   . Renal insufficiency      Allergies  Allergen Reactions  . Latex     Itching and rash     Current Outpatient Prescriptions  Medication Sig Dispense Refill  . aspirin EC 81 MG tablet Take 81 mg by mouth daily.       Marland Kitchen atorvastatin (LIPITOR) 20 MG tablet Take 20 mg by mouth at bedtime.    . Cholecalciferol (VITAMIN D3) 5000 UNITS CAPS Take 5,000 Units by mouth daily.     . citalopram (CELEXA) 40 MG tablet Take 40 mg by mouth daily.      Marland Kitchen FLUoxetine (PROZAC) 40 MG capsule Take 40 mg by mouth daily.    Marland Kitchen lisinopril-hydrochlorothiazide (PRINZIDE,ZESTORETIC) 20-12.5 MG per tablet Take 0.5 tablets by mouth daily.      . metFORMIN (GLUCOPHAGE-XR) 500 MG 24 hr tablet Take 1 tablet (500 mg total) by mouth 2 (two) times daily.    . metoprolol tartrate (LOPRESSOR) 25 MG tablet Take 1 tablet (25 mg total) by mouth 2 (two) times daily. 180 tablet 3  . Multiple Vitamins-Minerals (CENTRUM SILVER PO) Take 1 tablet by mouth daily.      . nitroGLYCERIN (NITROSTAT) 0.4 MG SL tablet Place 1 tablet (0.4 mg total) under the tongue every 5 (five) minutes as needed for chest pain. 25 tablet 3  . omeprazole (PRILOSEC) 20 MG capsule Take 20 mg by mouth daily.    Marland Kitchen oxybutynin (DITROPAN) 5 MG tablet Take 5 mg by mouth 3 (three) times daily.       No current facility-administered medications for this visit.      Past Surgical History:  Procedure Laterality Date  . CARDIAC CATHETERIZATION  01/21/2002   80%  stenosis of moderate sized diagonal Kongmeng Santoro  . CHOLECYSTECTOMY  1978  . LEFT BREAST BIOPSY     By Dr. Gabriel Cirri showed fibrocystic changes  . LEFT HEART CATHETERIZATION WITH CORONARY ANGIOGRAM N/A 12/28/2013   Procedure: LEFT HEART CATHETERIZATION WITH CORONARY ANGIOGRAM;  Surgeon: Corky Crafts, MD;  Location: [facility] CATH LAB;  Service: Cardiovascular;  Laterality: N/A;     Allergies  Allergen Reactions  . Latex     Itching and rash      Family History  Problem Relation Age of Onset  . Cancer Father   . Pneumonia Mother   . Cancer Brother        3 brothers died from cancer     Social History Shelby Trevino reports that she quit smoking about 28 years ago. Her smoking use included Cigarettes. She started  smoking about 49 years ago. She has a 5.25 pack-year smoking history. She has never used smokeless tobacco. Shelby Trevino reports that she does not drink alcohol.   Review of Systems CONSTITUTIONAL: No weight loss, fever, chills, weakness or fatigue.  HEENT: Eyes: No visual loss, blurred vision, double vision or yellow sclerae.No hearing loss, sneezing, congestion, runny nose or sore throat.  SKIN: No rash or itching.  CARDIOVASCULAR:  RESPIRATORY: No shortness of breath, cough or sputum.  GASTROINTESTINAL: No anorexia, nausea, vomiting or diarrhea. No abdominal pain or blood.  GENITOURINARY: No burning on urination, no polyuria NEUROLOGICAL: No headache, dizziness, syncope, paralysis, ataxia, numbness or tingling in the extremities. No change in bowel or bladder control.  MUSCULOSKELETAL: No muscle, back pain, joint pain or stiffness.  LYMPHATICS: No enlarged nodes. No history of splenectomy.  PSYCHIATRIC: No history of depression or anxiety.  ENDOCRINOLOGIC: No reports of sweating, cold or heat intolerance. No polyuria or polydipsia.  Marland Kitchen   Physical Examination There were no vitals filed for this visit. There were no vitals filed for this visit.  Gen: resting comfortably, no acute distress HEENT: no scleral icterus, pupils equal round and reactive, no palptable cervical adenopathy,  CV Resp: Clear to auscultation bilaterally GI: abdomen is soft, non-tender, non-distended, normal bowel sounds, no hepatosplenomegaly MSK: extremities are warm, no edema.  Skin: warm, no rash Neuro:  no focal deficits Psych: appropriate affect   Diagnostic Studies 12/2010 Echo Study Conclusions  - Left ventricle: The cavity size was mildly reduced. Wall thickness was increased in a pattern of mild LVH. Systolic function was normal. The estimated ejection fraction was in the range of 60% to 65%. Wall motion was normal; there were no regional wall motion abnormalities. Doppler parameters  are consistent with abnormal left ventricular relaxation (grade 1 diastolic dysfunction). - Aortic valve: Trileaflet; mildly calcified leaflets. - Mitral valve: Calcified annulus. Trivial regurgitation. - Left atrium: The atrium was at the upper limits of normal in size. - Tricuspid valve: Trivial regurgitation. - Pericardium, extracardiac: A trivial pericardial effusion was identified.  12/2010 Stress MPI  IMPRESSION: Low risk Lexiscan Myoview with concurrent ambulation. There were no diagnostic ST-segment changes, no sustained arrhythmias. Frequent atrial ectopy noted. Perfusion imaging is most consistent with breast attenuation affecting the anteroseptal region rather than scar, and there are no large areas of ischemia identified. LVEF is 69%.   11/2013 Lexiscan MPI [facility] LVEF 74%, normal wall motion. 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   12/2013 Cath HEMODYNAMICS: Aortic pressure was 109/62; LV pressure was 111/4; LVEDP 11. There was no gradient between the left ventricle and aorta.   ANGIOGRAPHIC DATA: The  left main coronary artery is widely patent.  The left anterior descending artery is a large vessel which reaches the apex. Just after the origin of a medium-sized second diagonal, there is a moderate 40% lesion. There is another focal moderate lesion just past this. The remainder the LAD is widely patent. The areas of moderate disease appear less significant in the cranial views. The third diagonal is medium-sized and patent. The first diagonal is large and widely patent. The second diagonal is medium-sized and patent  The left circumflex artery is a large vessel. There is only minimal atherosclerosis. There is a large OM1 which is widely patent.  The right coronary artery is a large dominant vessel. There is minimal atherosclerosis in the RCA system. The posterior descending artery and posterior  lateral arteries are widely patent.  LEFT VENTRICULOGRAM: Left ventricular angiogram was not done. LVEDP was 11 mmHg.  IMPRESSIONS:  1. Normal left main coronary artery. 2. Mild to moderate disease in the mid left anterior descending artery. 3. Widely patent left circumflex artery and its branches. 4. Widely patent right coronary artery. 5. Left ventricular systolic function not assessed. LVEDP 11 mmHg.  RECOMMENDATION: This appears to be a false positive stress test. There is certainly no evidence of multivessel disease. There is moderate single-vessel disease. We'll continue to manage the patient medically. If she had worsening symptoms, could consider FFR of the LAD, but I think that at this point based on the cranial views, FFR would likely be negative. Follow-up with Dr. Wyline Mood. Aggressive medical therapy.   12/2015 Holter  Telemetry tracings show sinus rhythm  Moderate supraventricular ectopy, primarily in form of PACs. 8 beat run of atach.  Moderate ventricular ectopy (15975 or 9%). Primarily in the form of isolated PVCs and bigeminy. No NSVT or sustained VT.  Min HR 43, Max HR 93, Avg HR 63  Sinus bradycardia to mid 40s noted in early AM hours presumably while sleeping.  No diary submitted    Assessment and Plan  1. Atypical chest pain - long history of atypical chest pain with negative evaluations for ischemic heart disease, including most recently normal nuclear stress test Jan 2018 -continue to monitor at this time  2. Tachycardia - holter monitor with supraventricular and ventricular ectopy, sustatined arrhythmias though did have short run of atach - very high caffeine intake, counseled to cut back. Her low heart rates by dynamap are misleading as it does not detect her ectopic beats. Recent monitor with avg HR in 60s, some bradycardia but mainly in early AM hours.    F/u 6 months      Antoine Poche, M.D., F.A.C.C.

## 2016-12-13 ENCOUNTER — Other Ambulatory Visit: Payer: Self-pay | Admitting: Family Medicine

## 2016-12-13 DIAGNOSIS — Z780 Asymptomatic menopausal state: Secondary | ICD-10-CM

## 2018-08-06 ENCOUNTER — Other Ambulatory Visit: Payer: Self-pay

## 2019-02-12 ENCOUNTER — Other Ambulatory Visit: Payer: Self-pay | Admitting: Neurosurgery

## 2019-02-12 DIAGNOSIS — Z981 Arthrodesis status: Secondary | ICD-10-CM

## 2019-02-12 DIAGNOSIS — M542 Cervicalgia: Secondary | ICD-10-CM

## 2019-02-23 ENCOUNTER — Ambulatory Visit: Payer: Medicare HMO | Attending: Neurosurgery

## 2019-09-22 ENCOUNTER — Other Ambulatory Visit: Admission: RE | Admit: 2019-09-22 | Payer: Medicare Other | Source: Ambulatory Visit

## 2019-09-23 ENCOUNTER — Other Ambulatory Visit: Payer: Medicare Other

## 2020-10-02 DIAGNOSIS — Z20828 Contact with and (suspected) exposure to other viral communicable diseases: Secondary | ICD-10-CM | POA: Diagnosis not present

## 2020-12-13 DIAGNOSIS — M25511 Pain in right shoulder: Secondary | ICD-10-CM | POA: Diagnosis not present

## 2020-12-25 DIAGNOSIS — Z6824 Body mass index (BMI) 24.0-24.9, adult: Secondary | ICD-10-CM | POA: Diagnosis not present

## 2020-12-25 DIAGNOSIS — E782 Mixed hyperlipidemia: Secondary | ICD-10-CM | POA: Diagnosis not present

## 2020-12-25 DIAGNOSIS — I10 Essential (primary) hypertension: Secondary | ICD-10-CM | POA: Diagnosis not present

## 2020-12-25 DIAGNOSIS — N182 Chronic kidney disease, stage 2 (mild): Secondary | ICD-10-CM | POA: Diagnosis not present

## 2020-12-25 DIAGNOSIS — E7849 Other hyperlipidemia: Secondary | ICD-10-CM | POA: Diagnosis not present

## 2020-12-25 DIAGNOSIS — E119 Type 2 diabetes mellitus without complications: Secondary | ICD-10-CM | POA: Diagnosis not present

## 2020-12-25 DIAGNOSIS — M25511 Pain in right shoulder: Secondary | ICD-10-CM | POA: Diagnosis not present

## 2020-12-28 DIAGNOSIS — E119 Type 2 diabetes mellitus without complications: Secondary | ICD-10-CM | POA: Diagnosis not present

## 2020-12-28 DIAGNOSIS — R29810 Facial weakness: Secondary | ICD-10-CM | POA: Diagnosis not present

## 2020-12-28 DIAGNOSIS — I639 Cerebral infarction, unspecified: Secondary | ICD-10-CM | POA: Diagnosis not present

## 2020-12-28 DIAGNOSIS — R29818 Other symptoms and signs involving the nervous system: Secondary | ICD-10-CM | POA: Diagnosis not present

## 2020-12-28 DIAGNOSIS — Z7984 Long term (current) use of oral hypoglycemic drugs: Secondary | ICD-10-CM | POA: Diagnosis not present

## 2020-12-28 DIAGNOSIS — Z8673 Personal history of transient ischemic attack (TIA), and cerebral infarction without residual deficits: Secondary | ICD-10-CM | POA: Diagnosis not present

## 2020-12-28 DIAGNOSIS — G510 Bell's palsy: Secondary | ICD-10-CM | POA: Diagnosis not present

## 2020-12-28 DIAGNOSIS — Z7982 Long term (current) use of aspirin: Secondary | ICD-10-CM | POA: Diagnosis not present

## 2020-12-28 DIAGNOSIS — I700 Atherosclerosis of aorta: Secondary | ICD-10-CM | POA: Diagnosis not present

## 2020-12-28 DIAGNOSIS — I10 Essential (primary) hypertension: Secondary | ICD-10-CM | POA: Diagnosis not present

## 2020-12-28 DIAGNOSIS — Z794 Long term (current) use of insulin: Secondary | ICD-10-CM | POA: Diagnosis not present

## 2020-12-28 DIAGNOSIS — Z20822 Contact with and (suspected) exposure to covid-19: Secondary | ICD-10-CM | POA: Diagnosis not present

## 2020-12-29 DIAGNOSIS — G510 Bell's palsy: Secondary | ICD-10-CM | POA: Diagnosis not present

## 2020-12-29 DIAGNOSIS — I10 Essential (primary) hypertension: Secondary | ICD-10-CM | POA: Diagnosis not present

## 2020-12-29 DIAGNOSIS — R42 Dizziness and giddiness: Secondary | ICD-10-CM | POA: Diagnosis not present

## 2020-12-29 DIAGNOSIS — R29810 Facial weakness: Secondary | ICD-10-CM | POA: Diagnosis not present

## 2020-12-29 DIAGNOSIS — R531 Weakness: Secondary | ICD-10-CM | POA: Diagnosis not present

## 2020-12-29 DIAGNOSIS — I639 Cerebral infarction, unspecified: Secondary | ICD-10-CM | POA: Diagnosis not present

## 2020-12-29 DIAGNOSIS — R29898 Other symptoms and signs involving the musculoskeletal system: Secondary | ICD-10-CM | POA: Diagnosis not present

## 2020-12-29 DIAGNOSIS — E119 Type 2 diabetes mellitus without complications: Secondary | ICD-10-CM | POA: Diagnosis not present

## 2021-01-16 DIAGNOSIS — Z6823 Body mass index (BMI) 23.0-23.9, adult: Secondary | ICD-10-CM | POA: Diagnosis not present

## 2021-01-16 DIAGNOSIS — M25511 Pain in right shoulder: Secondary | ICD-10-CM | POA: Diagnosis not present

## 2021-01-16 DIAGNOSIS — G510 Bell's palsy: Secondary | ICD-10-CM | POA: Diagnosis not present

## 2021-02-27 DIAGNOSIS — E7849 Other hyperlipidemia: Secondary | ICD-10-CM | POA: Diagnosis not present

## 2021-02-27 DIAGNOSIS — Z6824 Body mass index (BMI) 24.0-24.9, adult: Secondary | ICD-10-CM | POA: Diagnosis not present

## 2021-02-27 DIAGNOSIS — Z6823 Body mass index (BMI) 23.0-23.9, adult: Secondary | ICD-10-CM | POA: Diagnosis not present

## 2021-02-27 DIAGNOSIS — G510 Bell's palsy: Secondary | ICD-10-CM | POA: Diagnosis not present

## 2021-02-27 DIAGNOSIS — I10 Essential (primary) hypertension: Secondary | ICD-10-CM | POA: Diagnosis not present

## 2021-02-27 DIAGNOSIS — E782 Mixed hyperlipidemia: Secondary | ICD-10-CM | POA: Diagnosis not present

## 2021-02-27 DIAGNOSIS — M25511 Pain in right shoulder: Secondary | ICD-10-CM | POA: Diagnosis not present

## 2021-02-27 DIAGNOSIS — N182 Chronic kidney disease, stage 2 (mild): Secondary | ICD-10-CM | POA: Diagnosis not present

## 2021-02-27 DIAGNOSIS — E119 Type 2 diabetes mellitus without complications: Secondary | ICD-10-CM | POA: Diagnosis not present

## 2021-02-28 ENCOUNTER — Ambulatory Visit (INDEPENDENT_AMBULATORY_CARE_PROVIDER_SITE_OTHER): Payer: Medicare Other | Admitting: Neurology

## 2021-02-28 ENCOUNTER — Encounter: Payer: Self-pay | Admitting: Neurology

## 2021-02-28 ENCOUNTER — Telehealth: Payer: Self-pay | Admitting: Neurology

## 2021-02-28 VITALS — BP 160/76 | HR 70 | Ht 66.0 in | Wt 139.0 lb

## 2021-02-28 DIAGNOSIS — G510 Bell's palsy: Secondary | ICD-10-CM

## 2021-02-28 DIAGNOSIS — N259 Disorder resulting from impaired renal tubular function, unspecified: Secondary | ICD-10-CM

## 2021-02-28 DIAGNOSIS — Z8673 Personal history of transient ischemic attack (TIA), and cerebral infarction without residual deficits: Secondary | ICD-10-CM | POA: Diagnosis not present

## 2021-02-28 DIAGNOSIS — E1365 Other specified diabetes mellitus with hyperglycemia: Secondary | ICD-10-CM | POA: Diagnosis not present

## 2021-02-28 NOTE — Progress Notes (Signed)
[facility] [address] [place]. [place] [address] [phone]       OFFICE CONSULT NOTE  [name] Date of Birth:  [birth year] Medical Record Number:  [id number]   Referring MD: Roxine Caddy, PA-C  Reason for Referral: Bell's palsy  HPI: Shelby Trevino is a 71 year old pleasant African-American lady seen today for initial office consultation visit.  She is accompanied today by her daughter.  I have reviewed ER visit note to [facility] from 12/29/2020.  Available lab results and imaging study reports were reviewed but actual films were not available today.  She has no significant past medical history except diabetes and hypertension she was seen on 12/28/2020 at [facility] where she presented with sudden onset of right-sided facial weakness and drooping.  She also complained of some dryness of the right eye but she was able to close it.  She denied any headache, retroauricular pain on the right, decreased hearing, hyperacusis.  She denied any loss of taste.  She was found to have mild peripheral facial nerve back and weakness in the right diagnosed with Bell's palsy.  She was asked to keep the right eye covered with a patch while sleeping and use artificial tears.  She also given valacyclovir close and prednisone.  She had MRI scan of the brain done with and without contrast on 12/29/2020 which showed moderate changes of chronic small vessel disease.  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  Patient denies any prior history suggestive of a cerebellar stroke in the form of episode of incoordination, gait or balance problems.  She has been taking aspirin 81 mg daily.  She is done well since then and has not had a recurrent stroke or TIA symptoms.  Left facial weakness also appears to be improved and is barely discernible on exam today.  She states her eyes are no longer red and she does not use eye patch or close her eye.  She  has no new complaints today.  ROS:   14 system review of systems is positive for facial weakness dryness of the eyes only all other systems negative  PMH:  Past Medical History:  Diagnosis Date   AV nodal tachycardia (HCC) 2004   with radiofrequency ablation by Dr. Grant Fontana in 2004   CAD (coronary artery disease)    80% stenosis in moderate size diagonal on cath in 2004. with last adenosine Cardiolite study in 2007 showing no abnormalities   Depression    Diabetes mellitus    Dyslipidemia    GERD (gastroesophageal reflux disease)    Hyperlipidemia    Hypertension    Mitral valve prolapse    Osteoarthritis    Peripheral neuropathy (HCC)    Renal insufficiency     Social History:  Social History   Socioeconomic History   Marital status: Divorced    Spouse name: Not on file   Number of children: Not on file   Years of education: Not on file   Highest education level: Not on file  Occupational History   Not on file  Tobacco Use   Smoking status: Former    Packs/day: 0.25    Years: 21.00    Pack years: 5.25    Types: Cigarettes    Start date: 06/22/1966    Quit date: 07/23/1987    Years since quitting: 33.6   Smokeless tobacco: Never  Substance and Sexual Activity   Alcohol use: No    Alcohol/week: 0.0  standard drinks   Drug use: No   Sexual activity: Not on file  Other Topics Concern   Not on file  Social History Narrative   Has one son and one daughter.   Has five grandchildren.   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 3 brothers died with cancer.   Social Determinants of Health   Financial Resource Strain: Not on file  Food Insecurity: Not on file  Transportation Needs: Not on file  Physical Activity: Not on file  Stress: Not on file  Social Connections: Not on file  Intimate Partner Violence: Not on file    Medications:   Current Outpatient Medications on File Prior to Visit  Medication Sig Dispense Refill   aspirin EC 81  MG tablet Take 81 mg by mouth daily.       atorvastatin (LIPITOR) 20 MG tablet Take 20 mg by mouth at bedtime.     Cholecalciferol (VITAMIN D3) 5000 UNITS CAPS Take 5,000 Units by mouth daily.      lisinopril-hydrochlorothiazide (PRINZIDE,ZESTORETIC) 20-12.5 MG per tablet Take 0.5 tablets by mouth daily.       metFORMIN (GLUCOPHAGE-XR) 500 MG 24 hr tablet Take 1 tablet (500 mg total) by mouth 2 (two) times daily.     Multiple Vitamins-Minerals (CENTRUM SILVER PO) Take 1 tablet by mouth daily.       No current facility-administered medications on file prior to visit.    Allergies:   Allergies  Allergen Reactions   Latex     Itching and rash    Physical Exam General: well developed, well nourished elderly African-American lady, seated, in no evident distress Head: head normocephalic and atraumatic.   Neck: supple with no carotid or supraclavicular bruits Cardiovascular: regular rate and rhythm, no murmurs Musculoskeletal: no deformity Skin:  no rash/petichiae Vascular:  Normal pulses all extremities  Neurologic Exam Mental Status: Awake and fully alert. Oriented to place and time. Recent and remote memory intact. Attention span, concentration and fund of knowledge appropriate. Mood and affect appropriate.  Cranial Nerves: Fundoscopic exam reveals sharp disc margins. Pupils equal, briskly reactive to light. Extraocular movements full without nystagmus. Visual fields full to confrontation. Hearing intact. Facial sensation intact.  Minimum weakness of eye closure and nasolabial fold on the right  when she smiles.  No fasciculations or twitching's noted on the left side of the face., tongue, palate moves normally and symmetrically.  Motor: Normal bulk and tone. Normal strength in all tested extremity muscles. Sensory.: intact to touch , pinprick , position and vibratory sensation.  Coordination: Rapid alternating movements normal in all extremities. Finger-to-nose and heel-to-shin performed  accurately bilaterally. Gait and Station: Arises from chair without difficulty. Stance is normal. Gait demonstrates normal stride length and balance . Able to heel, toe and tandem walk without difficulty.  Reflexes: 1+ and symmetric. Toes downgoing.   NIHSS  0 Modified Rankin  0   ASSESSMENT: 71 year old African-American lady with  right-sided facial weakness in December 2022 likely from Bell's palsy which appears to have nearly resolved.  MRI scan at that time had shown left cerebellar infarct likely clinically silent.  Vascular risk factors of diabetes, hypertension, hyperlipidemia and age.     PLAN:I had a long d/w patient and her daughter about  her recent episode of partial left-sided facial weakness likely from Bell's palsy which appears to have nearly resolved.  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 I also advised the patient to eat a healthy diet with plenty of whole grains, cereals, fruits and vegetables, exercise regularly and maintain ideal body weight .I recommend further evaluation by checking CT angiogram of the brain and neck, lipid profile and hemoglobin A1c.  Followup in the future with me in 3 months or call earlier if necessary.  Greater than 50% time during this 45-minute visit was spent on counseling and coordination of care about her Bell's palsy and silent cerebral stroke and discussion about stroke prevention and treatment and answering questions. Delia Heady, MD  Note: This document was prepared with digital dictation and possible  smart phrase technology. Any transcriptional errors that result from this process are unintentional.

## 2021-02-28 NOTE — Telephone Encounter (Signed)
Medicare order sent to [facility], [facility] they will reach out to the patient to schedule.  °

## 2021-02-28 NOTE — Patient Instructions (Signed)
I had a long d/w patient and her daughter about  her recent episode of partial left-sided facial weakness likely from Bell's palsy which appears to have nearly resolved.  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 I also advised the patient to eat a healthy diet with plenty of whole grains, cereals, fruits and vegetables, exercise regularly and maintain ideal body weight .I recommend further evaluation by checking CT angiogram of the brain and neck, lipid profile and hemoglobin A1c.  Followup in the future with me in 3 months or call earlier if necessary .Stroke Prevention Some medical conditions and behaviors can lead to a higher chance of having a stroke. You can help prevent a stroke by eating healthy, exercising, not smoking, and managing any medical conditions you have. Stroke is a leading cause of functional impairment. Primary prevention is particularly important because a majority of strokes are first-time events. Stroke changes the lives of not only those who experience a stroke but also their family and other caregivers. How can this condition affect me? A stroke is a medical emergency and should be treated right away. A stroke can lead to brain damage and can sometimes be life-threatening. If a person gets medical treatment right away, there is a better chance of surviving and recovering from a stroke. What can increase my risk? The following medical conditions may increase your risk of a stroke: Cardiovascular disease. High blood pressure  (hypertension). Diabetes. High cholesterol. Sickle cell disease. Blood clotting disorders (hypercoagulable state). Obesity. Sleep disorders (obstructive sleep apnea). Other risk factors include: Being older than age 31. Having a history of blood clots, stroke, or mini-stroke (transient ischemic attack, TIA). Genetic factors, such as race, ethnicity, or a family history of stroke. Smoking cigarettes or using other tobacco products. Taking birth control pills, especially if you also use tobacco. Heavy use of alcohol or drugs, especially cocaine and methamphetamine. Physical inactivity. What actions can I take to prevent this? Manage your health conditions High cholesterol levels. Eating a healthy diet is important for preventing high cholesterol. If cholesterol cannot be managed through diet alone, you may need to take medicines. Take any prescribed medicines to control your cholesterol as told by your health care provider. Hypertension. To reduce your risk of stroke, try to keep your blood pressure below 130/80. Eating a healthy diet and exercising regularly are important for controlling blood pressure. If these steps are not enough to manage your blood pressure, you may need to take medicines. Take any prescribed medicines to control hypertension as told by your health care provider. Ask your health care provider if you should monitor your blood pressure at home. Have your blood pressure checked every year, even if your blood pressure is normal. Blood pressure increases with age and some medical conditions. Diabetes. Eating a healthy diet and exercising regularly are important parts of managing your blood sugar (glucose). If your blood sugar cannot be managed through diet and exercise, you may need to take medicines. Take any prescribed medicines to control your diabetes as told by your health care provider. Get evaluated for obstructive sleep apnea. Talk to your health care provider  about getting a sleep evaluation if you snore a lot or have excessive sleepiness. Make sure that any other medical conditions you have, such as atrial fibrillation or atherosclerosis, are managed. Nutrition Follow instructions from your health care provider about what to eat or drink to help manage your health condition. These instructions may include: Reducing your daily calorie intake. Limiting how much salt (sodium) you use to 1,500 milligrams (mg) each day. Using only healthy fats for cooking, such as olive oil, canola oil, or sunflower oil. Eating healthy foods. You can do this by: Choosing foods that are high in fiber, such as whole grains, and fresh fruits and vegetables. Eating at least 5 servings of fruits and vegetables a day. Try to fill one-half of your plate with fruits and vegetables at each meal. Choosing lean protein foods, such as lean cuts of meat, poultry without skin, fish, tofu, beans, and nuts. Eating low-fat dairy products. Avoiding foods that are high in sodium. This can help lower blood pressure. Avoiding foods that have saturated fat, trans fat, and cholesterol. This can help prevent high cholesterol. Avoiding processed and prepared foods. Counting your daily carbohydrate intake.  Lifestyle If you drink alcohol: Limit how much you have to: 0-1 drink a day for women who are not pregnant. 0-2 drinks a day for men. Know how much alcohol is in your drink. In the [place] one drink equals one 12 oz bottle of beer ( ), one 5 oz glass of wine ( ), or one 1 oz glass of hard liquor (79mL). Do not use any products that contain nicotine or tobacco. These products include cigarettes, chewing tobacco, and vaping devices, such as e-cigarettes. If you need help quitting, ask your health care provider. Avoid secondhand smoke. Do not use drugs. Activity  Try to stay at a healthy weight. Get at least 30 minutes of exercise on most days, such as: Fast  walking. Biking. Swimming. Medicines Take over-the-counter and prescription medicines only as told by your health care provider. Aspirin or blood thinners (antiplatelets or anticoagulants) may be recommended to reduce your risk of forming blood clots that can lead to stroke. Avoid taking birth control pills. Talk to your health care provider about the risks of taking birth control pills if: You are over 25 years old. You smoke. You get very bad headaches. You have had a blood clot. Where to find more information American Stroke Association: www.strokeassociation.org Get help right away if: You or a loved one has any symptoms of a stroke. "BE FAST" is an easy way to remember the main warning signs of a stroke: B - Balance. Signs are dizziness, sudden trouble walking, or loss of balance. E - Eyes. Signs are trouble seeing or a sudden change in vision. F - Face. Signs are sudden weakness or numbness of the face, or the face or eyelid drooping on one side. A - Arms. Signs are weakness or numbness in an arm. This happens suddenly and usually on one side of the body. S - Speech. Signs are sudden trouble speaking, slurred speech, or trouble understanding what people say. T - Time. Time to call emergency services. Write down what time symptoms started. You or a loved one has other signs of a stroke, such as: A sudden, severe headache with no known cause. Nausea or vomiting. Seizure. These symptoms may represent a serious problem that is an emergency. Do not wait to see if the symptoms will go away. Get medical help right away. Call your local emergency services (  911 in the [place] Do not drive yourself to the hospital. Summary You can help to prevent a stroke by eating healthy, exercising, not smoking, limiting alcohol intake, and managing any medical conditions you may have. Do not use any products that contain nicotine or tobacco. These include cigarettes, chewing tobacco, and vaping devices,  such as e-cigarettes. If you need help quitting, ask your health care provider. Remember "BE FAST" for warning signs of a stroke. Get help right away if you or a loved one has any of these signs. This information is not intended to replace advice given to you by your health care provider. Make sure you discuss any questions you have with your health care provider. Document Revised: 07/26/2019 Document Reviewed: 07/26/2019 Elsevier Patient Education  2022 [employer].

## 2021-03-01 LAB — LIPID PANEL
Chol/HDL Ratio: 3.6 ratio (ref 0.0–4.4)
Cholesterol, Total: 172 mg/dL (ref 100–199)
HDL: 48 mg/dL (ref >39)
LDL Chol Calc (NIH): 107 mg/dL — ABNORMAL HIGH (ref 0–99)
Triglycerides: 90 mg/dL (ref 0–149)
VLDL Cholesterol Cal: 17 mg/dL (ref 5–40)

## 2021-03-01 LAB — HEMOGLOBIN A1C
Est. average glucose Bld gHb Est-mCnc: 229 mg/dL
Hgb A1c MFr Bld: 9.6 % — ABNORMAL HIGH (ref 4.8–5.6)

## 2021-03-05 ENCOUNTER — Telehealth: Payer: Self-pay | Admitting: Neurology

## 2021-03-05 NOTE — Telephone Encounter (Signed)
Pt verified by name and DOB, results given per provider, pt voiced understanding all question answered. 

## 2021-03-05 NOTE — Telephone Encounter (Signed)
Please call patient, laboratory evaluation showed significantly elevated A1c 9.6, elevated LDL 107  I have forwarded lab result to his primary care at [facility]   She should contact his primary care physician for better control of her diabetes

## 2021-03-15 DIAGNOSIS — M75101 Unspecified rotator cuff tear or rupture of right shoulder, not specified as traumatic: Secondary | ICD-10-CM | POA: Diagnosis not present

## 2021-03-15 DIAGNOSIS — M25512 Pain in left shoulder: Secondary | ICD-10-CM | POA: Diagnosis not present

## 2021-03-15 DIAGNOSIS — M19012 Primary osteoarthritis, left shoulder: Secondary | ICD-10-CM | POA: Diagnosis not present

## 2021-03-15 DIAGNOSIS — M75102 Unspecified rotator cuff tear or rupture of left shoulder, not specified as traumatic: Secondary | ICD-10-CM | POA: Diagnosis not present

## 2021-03-15 DIAGNOSIS — M19011 Primary osteoarthritis, right shoulder: Secondary | ICD-10-CM | POA: Diagnosis not present

## 2021-03-15 DIAGNOSIS — M25511 Pain in right shoulder: Secondary | ICD-10-CM | POA: Diagnosis not present

## 2021-03-21 DIAGNOSIS — N814 Uterovaginal prolapse, unspecified: Secondary | ICD-10-CM | POA: Diagnosis not present

## 2021-04-06 DIAGNOSIS — Z20822 Contact with and (suspected) exposure to covid-19: Secondary | ICD-10-CM | POA: Diagnosis not present

## 2021-04-25 DIAGNOSIS — Z4689 Encounter for fitting and adjustment of other specified devices: Secondary | ICD-10-CM | POA: Diagnosis not present

## 2021-04-25 DIAGNOSIS — N762 Acute vulvitis: Secondary | ICD-10-CM | POA: Diagnosis not present

## 2021-04-25 DIAGNOSIS — R159 Full incontinence of feces: Secondary | ICD-10-CM | POA: Diagnosis not present

## 2021-06-06 ENCOUNTER — Emergency Department (HOSPITAL_COMMUNITY): Payer: Medicare Other

## 2021-06-06 ENCOUNTER — Other Ambulatory Visit: Payer: Self-pay

## 2021-06-06 ENCOUNTER — Inpatient Hospital Stay (HOSPITAL_COMMUNITY)
Admission: EM | Admit: 2021-06-06 | Discharge: 2021-06-15 | DRG: 246 | Disposition: A | Discharge status: Skilled Nursing Facility | Payer: Medicare Other | Attending: Cardiology | Admitting: Cardiology

## 2021-06-06 ENCOUNTER — Encounter (HOSPITAL_COMMUNITY): Payer: Self-pay | Admitting: *Deleted

## 2021-06-06 DIAGNOSIS — I493 Ventricular premature depolarization: Secondary | ICD-10-CM | POA: Diagnosis present

## 2021-06-06 DIAGNOSIS — I5023 Acute on chronic systolic (congestive) heart failure: Secondary | ICD-10-CM | POA: Diagnosis present

## 2021-06-06 DIAGNOSIS — Z7401 Bed confinement status: Secondary | ICD-10-CM | POA: Diagnosis not present

## 2021-06-06 DIAGNOSIS — I10 Essential (primary) hypertension: Secondary | ICD-10-CM | POA: Diagnosis present

## 2021-06-06 DIAGNOSIS — E440 Moderate protein-calorie malnutrition: Secondary | ICD-10-CM | POA: Diagnosis present

## 2021-06-06 DIAGNOSIS — Z7982 Long term (current) use of aspirin: Secondary | ICD-10-CM | POA: Diagnosis not present

## 2021-06-06 DIAGNOSIS — K219 Gastro-esophageal reflux disease without esophagitis: Secondary | ICD-10-CM | POA: Diagnosis present

## 2021-06-06 DIAGNOSIS — R778 Other specified abnormalities of plasma proteins: Secondary | ICD-10-CM | POA: Diagnosis not present

## 2021-06-06 DIAGNOSIS — E1142 Type 2 diabetes mellitus with diabetic polyneuropathy: Secondary | ICD-10-CM | POA: Diagnosis present

## 2021-06-06 DIAGNOSIS — R262 Difficulty in walking, not elsewhere classified: Secondary | ICD-10-CM | POA: Diagnosis present

## 2021-06-06 DIAGNOSIS — Z79899 Other long term (current) drug therapy: Secondary | ICD-10-CM | POA: Diagnosis not present

## 2021-06-06 DIAGNOSIS — E118 Type 2 diabetes mellitus with unspecified complications: Secondary | ICD-10-CM | POA: Diagnosis present

## 2021-06-06 DIAGNOSIS — Z9049 Acquired absence of other specified parts of digestive tract: Secondary | ICD-10-CM

## 2021-06-06 DIAGNOSIS — Z7984 Long term (current) use of oral hypoglycemic drugs: Secondary | ICD-10-CM

## 2021-06-06 DIAGNOSIS — E785 Hyperlipidemia, unspecified: Secondary | ICD-10-CM | POA: Diagnosis present

## 2021-06-06 DIAGNOSIS — R0789 Other chest pain: Secondary | ICD-10-CM | POA: Diagnosis not present

## 2021-06-06 DIAGNOSIS — F32A Depression, unspecified: Secondary | ICD-10-CM | POA: Diagnosis present

## 2021-06-06 DIAGNOSIS — Z87891 Personal history of nicotine dependence: Secondary | ICD-10-CM | POA: Diagnosis not present

## 2021-06-06 DIAGNOSIS — I249 Acute ischemic heart disease, unspecified: Secondary | ICD-10-CM | POA: Diagnosis present

## 2021-06-06 DIAGNOSIS — I2721 Secondary pulmonary arterial hypertension: Secondary | ICD-10-CM | POA: Diagnosis present

## 2021-06-06 DIAGNOSIS — N179 Acute kidney failure, unspecified: Secondary | ICD-10-CM | POA: Diagnosis not present

## 2021-06-06 DIAGNOSIS — I214 Non-ST elevation (NSTEMI) myocardial infarction: Secondary | ICD-10-CM | POA: Diagnosis present

## 2021-06-06 DIAGNOSIS — Z20822 Contact with and (suspected) exposure to covid-19: Secondary | ICD-10-CM | POA: Diagnosis present

## 2021-06-06 DIAGNOSIS — R5383 Other fatigue: Secondary | ICD-10-CM | POA: Diagnosis not present

## 2021-06-06 DIAGNOSIS — I471 Supraventricular tachycardia: Secondary | ICD-10-CM | POA: Diagnosis not present

## 2021-06-06 DIAGNOSIS — Z681 Body mass index (BMI) 19 or less, adult: Secondary | ICD-10-CM

## 2021-06-06 DIAGNOSIS — I509 Heart failure, unspecified: Secondary | ICD-10-CM | POA: Diagnosis not present

## 2021-06-06 DIAGNOSIS — I5043 Acute on chronic combined systolic (congestive) and diastolic (congestive) heart failure: Secondary | ICD-10-CM | POA: Diagnosis not present

## 2021-06-06 DIAGNOSIS — J90 Pleural effusion, not elsewhere classified: Secondary | ICD-10-CM | POA: Diagnosis not present

## 2021-06-06 DIAGNOSIS — R079 Chest pain, unspecified: Secondary | ICD-10-CM | POA: Diagnosis not present

## 2021-06-06 DIAGNOSIS — R531 Weakness: Secondary | ICD-10-CM | POA: Diagnosis not present

## 2021-06-06 DIAGNOSIS — Z809 Family history of malignant neoplasm, unspecified: Secondary | ICD-10-CM

## 2021-06-06 DIAGNOSIS — I25110 Atherosclerotic heart disease of native coronary artery with unstable angina pectoris: Secondary | ICD-10-CM | POA: Diagnosis not present

## 2021-06-06 DIAGNOSIS — I2510 Atherosclerotic heart disease of native coronary artery without angina pectoris: Secondary | ICD-10-CM | POA: Diagnosis not present

## 2021-06-06 DIAGNOSIS — I428 Other cardiomyopathies: Secondary | ICD-10-CM | POA: Diagnosis not present

## 2021-06-06 DIAGNOSIS — I110 Hypertensive heart disease with heart failure: Principal | ICD-10-CM | POA: Diagnosis present

## 2021-06-06 DIAGNOSIS — M6281 Muscle weakness (generalized): Secondary | ICD-10-CM | POA: Diagnosis present

## 2021-06-06 DIAGNOSIS — I255 Ischemic cardiomyopathy: Secondary | ICD-10-CM | POA: Diagnosis not present

## 2021-06-06 DIAGNOSIS — I5041 Acute combined systolic (congestive) and diastolic (congestive) heart failure: Secondary | ICD-10-CM | POA: Diagnosis not present

## 2021-06-06 DIAGNOSIS — R4181 Age-related cognitive decline: Secondary | ICD-10-CM | POA: Diagnosis present

## 2021-06-06 DIAGNOSIS — R0602 Shortness of breath: Secondary | ICD-10-CM | POA: Diagnosis not present

## 2021-06-06 DIAGNOSIS — Z955 Presence of coronary angioplasty implant and graft: Secondary | ICD-10-CM | POA: Diagnosis not present

## 2021-06-06 LAB — CBC
HCT: 34.8 % — ABNORMAL LOW (ref 36.0–46.0)
Hemoglobin: 11.4 g/dL — ABNORMAL LOW (ref 12.0–15.0)
MCH: 28.6 pg (ref 26.0–34.0)
MCHC: 32.8 g/dL (ref 30.0–36.0)
MCV: 87.2 fL (ref 80.0–100.0)
Platelets: 365 10*3/uL (ref 150–400)
RBC: 3.99 MIL/uL (ref 3.87–5.11)
RDW: 14.6 % (ref 11.5–15.5)
WBC: 6.2 10*3/uL (ref 4.0–10.5)
nRBC: 0 % (ref 0.0–0.2)

## 2021-06-06 LAB — BASIC METABOLIC PANEL
Anion gap: 10 (ref 5–15)
BUN: 29 mg/dL — ABNORMAL HIGH (ref 8–23)
CO2: 22 mmol/L (ref 22–32)
Calcium: 9.2 mg/dL (ref 8.9–10.3)
Chloride: 105 mmol/L (ref 98–111)
Creatinine, Ser: 1.01 mg/dL — ABNORMAL HIGH (ref 0.44–1.00)
GFR, Estimated: 59 mL/min — ABNORMAL LOW (ref >60)
Glucose, Bld: 201 mg/dL — ABNORMAL HIGH (ref 70–99)
Potassium: 5 mmol/L (ref 3.5–5.1)
Sodium: 137 mmol/L (ref 135–145)

## 2021-06-06 LAB — GLUCOSE, CAPILLARY: Glucose-Capillary: 163 mg/dL — ABNORMAL HIGH (ref 70–99)

## 2021-06-06 LAB — PROTIME-INR
INR: 1.1 (ref 0.8–1.2)
Prothrombin Time: 14.4 seconds (ref 11.4–15.2)

## 2021-06-06 LAB — RESP PANEL BY RT-PCR (FLU A&B, COVID) ARPGX2
Influenza A by PCR: NEGATIVE
Influenza B by PCR: NEGATIVE
SARS Coronavirus 2 by RT PCR: NEGATIVE

## 2021-06-06 LAB — BRAIN NATRIURETIC PEPTIDE: B Natriuretic Peptide: 2924 pg/mL — ABNORMAL HIGH (ref 0.0–100.0)

## 2021-06-06 LAB — TROPONIN I (HIGH SENSITIVITY)
Troponin I (High Sensitivity): 546 ng/L (ref <18)
Troponin I (High Sensitivity): 584 ng/L (ref <18)

## 2021-06-06 MED ORDER — ONDANSETRON HCL 4 MG/2ML IJ SOLN
4.0000 mg | Freq: Once | INTRAMUSCULAR | Status: AC
Start: 2021-06-06 — End: 2021-06-06
  Administered 2021-06-06: 4 mg via INTRAVENOUS
  Filled 2021-06-06: qty 2

## 2021-06-06 MED ORDER — LOSARTAN POTASSIUM 25 MG PO TABS: 25.0000 mg | ORAL_TABLET | Freq: Every day | ORAL | Status: DC

## 2021-06-06 MED ORDER — HEPARIN (PORCINE) 25000 UT/250ML-% IV SOLN
700.0000 [IU]/h | INTRAVENOUS | Status: DC
  Administered 2021-06-06: 750 [IU]/h via INTRAVENOUS
  Administered 2021-06-07: 650 [IU]/h via INTRAVENOUS
  Filled 2021-06-06 (×2): qty 250

## 2021-06-06 MED ORDER — SODIUM CHLORIDE 0.9 % IV SOLN
250.0000 mL | INTRAVENOUS | Status: DC | PRN
Start: 2021-06-06 — End: 2021-06-15

## 2021-06-06 MED ORDER — ASPIRIN 81 MG PO TBEC
81.0000 mg | DELAYED_RELEASE_TABLET | Freq: Every day | ORAL | Status: DC
  Administered 2021-06-07 – 2021-06-08 (×2): 81 mg via ORAL
  Filled 2021-06-06 (×3): qty 1

## 2021-06-06 MED ORDER — INSULIN ASPART 100 UNIT/ML IJ SOLN
0.0000 [IU] | Freq: Every day | INTRAMUSCULAR | Status: DC
  Administered 2021-06-08 – 2021-06-11 (×3): 2 [IU] via SUBCUTANEOUS
  Administered 2021-06-13: 3 [IU] via SUBCUTANEOUS

## 2021-06-06 MED ORDER — SODIUM CHLORIDE 0.9% FLUSH
3.0000 mL | INTRAVENOUS | Status: DC | PRN
Start: 2021-06-06 — End: 2021-06-15

## 2021-06-06 MED ORDER — IOHEXOL 350 MG/ML SOLN
100.0000 mL | Freq: Once | INTRAVENOUS | Status: AC | PRN
  Administered 2021-06-06: 75 mL via INTRAVENOUS

## 2021-06-06 MED ORDER — FUROSEMIDE 10 MG/ML IJ SOLN
80.0000 mg | Freq: Four times a day (QID) | INTRAMUSCULAR | Status: AC
  Administered 2021-06-07 (×4): 80 mg via INTRAVENOUS
  Filled 2021-06-06 (×4): qty 8

## 2021-06-06 MED ORDER — ATORVASTATIN CALCIUM 10 MG PO TABS
20.0000 mg | ORAL_TABLET | Freq: Every day | ORAL | Status: DC
Start: 2021-06-07 — End: 2021-06-08
  Administered 2021-06-07: 20 mg via ORAL
  Filled 2021-06-06: qty 2

## 2021-06-06 MED ORDER — FUROSEMIDE 10 MG/ML IJ SOLN
20.0000 mg | Freq: Once | INTRAMUSCULAR | Status: AC
  Administered 2021-06-06: 20 mg via INTRAVENOUS
  Filled 2021-06-06: qty 2

## 2021-06-06 MED ORDER — ONDANSETRON HCL 4 MG/2ML IJ SOLN
4.0000 mg | Freq: Four times a day (QID) | INTRAMUSCULAR | Status: DC | PRN
  Administered 2021-06-07 (×2): 4 mg via INTRAVENOUS
  Filled 2021-06-06 (×2): qty 2

## 2021-06-06 MED ORDER — ASPIRIN 81 MG PO CHEW
324.0000 mg | CHEWABLE_TABLET | Freq: Once | ORAL | Status: AC
  Administered 2021-06-06: 324 mg via ORAL
  Filled 2021-06-06: qty 4

## 2021-06-06 MED ORDER — INSULIN ASPART 100 UNIT/ML IJ SOLN
0.0000 [IU] | Freq: Three times a day (TID) | INTRAMUSCULAR | Status: DC
  Administered 2021-06-07 – 2021-06-09 (×4): 2 [IU] via SUBCUTANEOUS
  Administered 2021-06-09: 5 [IU] via SUBCUTANEOUS
  Administered 2021-06-10 – 2021-06-11 (×2): 1 [IU] via SUBCUTANEOUS
  Administered 2021-06-11: 2 [IU] via SUBCUTANEOUS
  Administered 2021-06-11: 1 [IU] via SUBCUTANEOUS
  Administered 2021-06-12 (×2): 2 [IU] via SUBCUTANEOUS
  Administered 2021-06-12 – 2021-06-13 (×4): 1 [IU] via SUBCUTANEOUS
  Administered 2021-06-14: 2 [IU] via SUBCUTANEOUS
  Administered 2021-06-14: 3 [IU] via SUBCUTANEOUS
  Administered 2021-06-14: 2 [IU] via SUBCUTANEOUS
  Administered 2021-06-15: 3 [IU] via SUBCUTANEOUS
  Administered 2021-06-15: 2 [IU] via SUBCUTANEOUS

## 2021-06-06 MED ORDER — HEPARIN BOLUS VIA INFUSION
3800.0000 [IU] | Freq: Once | INTRAVENOUS | Status: AC
  Administered 2021-06-06: 3800 [IU] via INTRAVENOUS

## 2021-06-06 MED ORDER — SODIUM CHLORIDE 0.9% FLUSH
3.0000 mL | Freq: Two times a day (BID) | INTRAVENOUS | Status: DC
  Administered 2021-06-07 – 2021-06-15 (×14): 3 mL via INTRAVENOUS

## 2021-06-06 MED ORDER — ASPIRIN 81 MG PO CHEW: 324.0000 mg | CHEWABLE_TABLET | Freq: Once | ORAL | Status: DC

## 2021-06-06 MED ORDER — ENSURE ENLIVE PO LIQD: 237.0000 mL | Freq: Two times a day (BID) | ORAL | Status: DC

## 2021-06-06 MED ORDER — ACETAMINOPHEN 325 MG PO TABS: 650.0000 mg | ORAL_TABLET | ORAL | Status: DC | PRN

## 2021-06-06 NOTE — ED Provider Notes (Signed)
[facility] Provider Note   CSN: [id number] Arrival date & time: 06/06/21  1716     History {Add pertinent medical, surgical, social history, OB history to HPI:1} Chief Complaint  Patient presents with   Shortness of Breath    Shelby Trevino is a 71 y.o. female who presents emergency department with chief complaint of swelling and shortness of breath.  History is given by the patient and her daughter at bedside.  She has a past medical history of hyperlipidemia, diabetes, CAD, reflux, hypertension, mitral valve prolapse, renal insufficiency, and AV nodal tachycardia.  Patient has seen cardiology in the past and her last echocardiogram was in 2012.  Patient has had 2 weeks of progressively worsening swelling of the bilateral lower extremities, shortness of breath, orthopnea and PND.  She has worsening shortness of breath with exertion as well.  Her daughter states that they have been trying to get her to come in for the last 2 weeks.  She has no known history of CHF.  She denies abdominal pain or distention.  She is complaining of pain in the right side of her chest which is worse when she lies on the right side, better when she lies on her back and associated nausea and retching.  Patient denies any severe chest pain prior to the onset of her swelling.   Shortness of Breath     Home Medications Prior to Admission medications   Medication Sig Start Date End Date Taking? Authorizing Provider  ascorbic acid (VITAMIN C) 500 MG tablet Take 1 tablet by mouth daily.   Yes [provider]  aspirin EC 81 MG tablet Take 81 mg by mouth daily.     Yes [provider]  atorvastatin (LIPITOR) 20 MG tablet Take 20 mg by mouth at bedtime.   Yes [provider]  Cholecalciferol (VITAMIN D3) 5000 UNITS CAPS Take 2,000 Units by mouth daily.   Yes [provider]  clotrimazole-betamethasone (LOTRISONE) cream Apply 1 application. topically 2 (two)  times daily. 04/25/21 04/25/22 Yes [provider]  estradiol (ESTRACE) 0.1 MG/GM vaginal cream Place vaginally. 03/22/21 03/22/22 Yes [provider]  metFORMIN (GLUCOPHAGE-XR) 500 MG 24 hr tablet Take 1 tablet (500 mg total) by mouth 2 (two) times daily. 12/30/13  Yes Jettie Booze, MD  Methenamine-Sodium Salicylate (AZO URINARY TRACT DEFENSE PO) Take 2 capsules by mouth daily.   Yes [provider]  Multiple Vitamins-Minerals (CENTRUM SILVER PO) Take 1 tablet by mouth daily.     Yes [provider]  vitamin E 200 UNIT capsule Take by mouth.   Yes [provider]      Allergies    Latex    Review of Systems   Review of Systems  Respiratory:  Positive for shortness of breath.    Physical Exam Updated Vital Signs BP 113/84   Pulse 93   Temp 97.7 F (36.5 C) (Oral)   Resp 14   Ht 5\' 7"  (1.702 m)   Wt 63.5 kg   SpO2 96%   BMI 21.93 kg/m  Physical Exam Vitals and nursing note reviewed.  Constitutional:      General: She is not in acute distress.    Appearance: She is well-developed. She is not diaphoretic.  HENT:     Head: Normocephalic and atraumatic.     Right Ear: External ear normal.     Left Ear: External ear normal.     Nose: Nose normal.  Mouth/Throat:     Mouth: Mucous membranes are moist.  Eyes:     General: No scleral icterus.    Conjunctiva/sclera: Conjunctivae normal.  Neck:     Vascular: JVD present.  Cardiovascular:     Rate and Rhythm: Normal rate and regular rhythm.     Heart sounds: Normal heart sounds. No murmur heard.   No friction rub. No gallop.  Pulmonary:     Effort: Pulmonary effort is normal. Tachypnea present. No respiratory distress.     Breath sounds: Decreased breath sounds present.  Abdominal:     General: Bowel sounds are normal. There is no distension.     Palpations: Abdomen is soft. There is no mass.     Tenderness: There is no abdominal tenderness. There is no guarding.   Musculoskeletal:     Cervical back: Normal range of motion.     Right lower leg: 4+ Pitting Edema present.     Left lower leg: 4+ Pitting Edema present.  Skin:    General: Skin is warm and dry.  Neurological:     Mental Status: She is alert and oriented to person, place, and time.  Psychiatric:        Behavior: Behavior normal.    ED Results / Procedures / Treatments   Labs (all labs ordered are listed, but only abnormal results are displayed) Labs Reviewed  BASIC METABOLIC PANEL - Abnormal; Notable for the following components:      Result Value   Glucose, Bld 201 (*)    BUN 29 (*)    Creatinine, Ser 1.01 (*)    GFR, Estimated 59 (*)    All other components within normal limits  CBC - Abnormal; Notable for the following components:   Hemoglobin 11.4 (*)    HCT 34.8 (*)    All other components within normal limits  BRAIN NATRIURETIC PEPTIDE - Abnormal; Notable for the following components:   B Natriuretic Peptide 2,924.0 (*)    All other components within normal limits  TROPONIN I (HIGH SENSITIVITY) - Abnormal; Notable for the following components:   Troponin I (High Sensitivity) 546 (*)    All other components within normal limits  RESP PANEL BY RT-PCR (FLU A&B, COVID) [id number]  TROPONIN I (HIGH SENSITIVITY)    EKG EKG Interpretation  Date/Time:  Wednesday Jun 06 2021 17:32:54 EDT Ventricular Rate:  110 PR Interval:  168 QRS Duration: 84 QT Interval:  342 QTC Calculation: 462 R Axis:   -70 Text Interpretation: Sinus tachycardia Left axis deviation Low voltage QRS Inferior infarct (cited on or before 06-Jun-2021) Cannot rule out Anteroseptal infarct , age undetermined Abnormal ECG When compared with ECG of 28-Dec-2013 09:29, Vent. rate has increased BY  45 BPM Confirmed by Dorie Rank [phone]) on 06/06/2021 5:47:06 PM  Radiology DG Chest 2 View  Result Date: 06/06/2021 CLINICAL DATA:  Shortness of breath EXAM: CHEST - 2 VIEW COMPARISON:  12/28/2020 FINDINGS:  Transverse diameter of heart is increased. Central pulmonary vessels are prominent. There are no signs of alveolar pulmonary edema. There is interval appearance of small bilateral pleural effusions. There are linear densities in the both lower lung fields. There is no pneumothorax. IMPRESSION: Cardiomegaly. Central pulmonary vessels are more prominent suggesting mild CHF. Small bilateral pleural effusions. There are linear densities in both lower lung fields suggesting atelectasis/pneumonia. Electronically Signed   By: Elmer Picker M.D.   On: 06/06/2021 18:03    Procedures Procedures  {Document cardiac monitor, telemetry assessment procedure when appropriate:1}  Medications Ordered in ED Medications  ondansetron (ZOFRAN) injection 4 mg (4 mg Intravenous Given 06/06/21 1909)  furosemide (LASIX) injection 20 mg (20 mg Intravenous Given 06/06/21 1909)    ED Course/ Medical Decision Making/ A&P Clinical Course as of 06/06/21 1937  Wed Jun 06, 2021  1858 ED EKG [AH]    Clinical Course User Index [AH] Margarita Mail, PA-C                           Medical Decision Making Amount and/or Complexity of Data Reviewed Labs: ordered. Radiology: ordered. ECG/medicine tests:  Decision-making details documented in ED Course.  Risk Prescription drug management.   ***  {Document critical care time when appropriate:1} {Document review of labs and clinical decision tools ie heart score, Chads2Vasc2 etc:1}  {Document your independent review of radiology images, and any outside records:1} {Document your discussion with family members, caretakers, and with consultants:1} {Document social determinants of health affecting pt's care:1} {Document your decision making why or why not admission, treatments were needed:1} Final Clinical Impression(s) / ED Diagnoses Final diagnoses:  None    Rx / DC Orders ED Discharge Orders     None

## 2021-06-06 NOTE — ED Notes (Addendum)
Phone report to Shanon Brow, RN with [facility]. Approx 30 min

## 2021-06-06 NOTE — ED Notes (Signed)
Patient transported to CT 

## 2021-06-06 NOTE — Progress Notes (Signed)
ANTICOAGULATION CONSULT NOTE - Initial Consult  Pharmacy Consult for heparin Indication: chest pain/ACS  Allergies  Allergen Reactions   Latex     Itching and rash    Patient Measurements: Height: 5\' 7"  (170.2 cm) Weight: 63.5 kg (140 lb) IBW/kg (Calculated) : 61.6 Heparin Dosing Weight: 63.5 kg  Vital Signs: Temp: 97.7 F (36.5 C) (05/31 1722) Temp Source: Oral (05/31 1722) BP: 113/84 (05/31 1915) Pulse Rate: 93 (05/31 1915)  Labs: Recent Labs    06/06/21 1754 06/06/21 1757  HGB 11.4*  --   HCT 34.8*  --   PLT 365  --   CREATININE 1.01*  --   TROPONINIHS  --  546*    Estimated Creatinine Clearance: 49 mL/min (A) (by C-G formula based on SCr of 1.01 mg/dL (H)).   Medical History: Past Medical History:  Diagnosis Date   AV nodal tachycardia (HCC) 2004   with radiofrequency ablation by Dr. 2005 in 2004   CAD (coronary artery disease)    80% stenosis in moderate size diagonal on cath in 2004. with last adenosine Cardiolite study in 2007 showing no abnormalities   Depression    Diabetes mellitus    Dyslipidemia    GERD (gastroesophageal reflux disease)    Hyperlipidemia    Hypertension    Mitral valve prolapse    Osteoarthritis    Peripheral neuropathy    Renal insufficiency     Medications:  Scheduled:   aspirin  324 mg Oral Once   heparin  3,800 Units Intravenous Once   Infusions:   heparin      Assessment: 71 yo female presented with shortness of breath, leg swelling, and chest pain per MD note. Troponin elevated. Baseline INR ordered.  Goal of Therapy:  Heparin level 0.3-0.7 units/ml Monitor platelets by anticoagulation protocol: Yes   Plan:  Give 3800 units bolus x 1 Start heparin infusion at 750 units/hr Check anti-Xa level in 6-8 hours and daily while on heparin Continue to monitor H&H and platelets  61 Nasya Vincent 06/06/2021,7:58 PM

## 2021-06-06 NOTE — ED Notes (Signed)
Pt's daughter, Marylene Land, notified of patient transfer over to [facility] at [facility]

## 2021-06-06 NOTE — ED Provider Triage Note (Signed)
Emergency Medicine Provider Triage Evaluation Note  Shelby Trevino , a 71 y.o. female  was evaluated in triage.  Pt complains of symptoms of breath and leg swelling over the last couple days, no sputum production no URI-like symptoms no history of heart abnormalities.  Denies any chest pain but states she has difficulty breathing associate ambulation though she PEs or DVTs..  Review of Systems  Positive: Shortness of breath leg swelling Negative: Chest pain, leg pain  Physical Exam  BP 135/88   Pulse 99   Temp 97.7 F (36.5 C) (Oral)   Resp 16   Ht 5\' 7"  (1.702 m)   Wt 63.5 kg   SpO2 99%   BMI 21.93 kg/m  Gen:   Awake, no distress   Resp:  Normal effort  MSK:   Moves extremities without difficulty  Other:    Medical Decision Making  Medically screening exam initiated at 5:57 PM.  Appropriate orders placed.  Shelby Trevino was informed that the remainder of the evaluation will be completed by another provider, this initial triage assessment does not replace that evaluation, and the importance of remaining in the ED until their evaluation is complete.  Presents with shortness of breath and leg swelling lab work imaging ordered and will need further work-up.   , PA-C 06/06/21 1758

## 2021-06-06 NOTE — ED Triage Notes (Signed)
Pt c/o sob and bilateral fluid retention for the past week  Pt c/o some right side abdominal pain with nausea

## 2021-06-07 ENCOUNTER — Other Ambulatory Visit (HOSPITAL_COMMUNITY): Payer: Self-pay

## 2021-06-07 ENCOUNTER — Inpatient Hospital Stay (HOSPITAL_COMMUNITY): Payer: Medicare Other

## 2021-06-07 DIAGNOSIS — I428 Other cardiomyopathies: Secondary | ICD-10-CM

## 2021-06-07 DIAGNOSIS — E440 Moderate protein-calorie malnutrition: Secondary | ICD-10-CM | POA: Insufficient documentation

## 2021-06-07 DIAGNOSIS — I509 Heart failure, unspecified: Secondary | ICD-10-CM

## 2021-06-07 DIAGNOSIS — I471 Supraventricular tachycardia: Secondary | ICD-10-CM

## 2021-06-07 DIAGNOSIS — R778 Other specified abnormalities of plasma proteins: Secondary | ICD-10-CM

## 2021-06-07 LAB — GLUCOSE, CAPILLARY
Glucose-Capillary: 128 mg/dL — ABNORMAL HIGH (ref 70–99)
Glucose-Capillary: 146 mg/dL — ABNORMAL HIGH (ref 70–99)
Glucose-Capillary: 171 mg/dL — ABNORMAL HIGH (ref 70–99)
Glucose-Capillary: 174 mg/dL — ABNORMAL HIGH (ref 70–99)
Glucose-Capillary: 185 mg/dL — ABNORMAL HIGH (ref 70–99)

## 2021-06-07 LAB — BASIC METABOLIC PANEL
Anion gap: 11 (ref 5–15)
BUN: 28 mg/dL — ABNORMAL HIGH (ref 8–23)
CO2: 22 mmol/L (ref 22–32)
Calcium: 9 mg/dL (ref 8.9–10.3)
Chloride: 103 mmol/L (ref 98–111)
Creatinine, Ser: 1.06 mg/dL — ABNORMAL HIGH (ref 0.44–1.00)
GFR, Estimated: 56 mL/min — ABNORMAL LOW (ref >60)
Glucose, Bld: 226 mg/dL — ABNORMAL HIGH (ref 70–99)
Potassium: 3.7 mmol/L (ref 3.5–5.1)
Sodium: 136 mmol/L (ref 135–145)

## 2021-06-07 LAB — CBC
HCT: 32 % — ABNORMAL LOW (ref 36.0–46.0)
Hemoglobin: 10.9 g/dL — ABNORMAL LOW (ref 12.0–15.0)
MCH: 28.8 pg (ref 26.0–34.0)
MCHC: 34.1 g/dL (ref 30.0–36.0)
MCV: 84.4 fL (ref 80.0–100.0)
Platelets: 309 10*3/uL (ref 150–400)
RBC: 3.79 MIL/uL — ABNORMAL LOW (ref 3.87–5.11)
RDW: 14.6 % (ref 11.5–15.5)
WBC: 6.6 10*3/uL (ref 4.0–10.5)
nRBC: 0 % (ref 0.0–0.2)

## 2021-06-07 LAB — URINALYSIS, ROUTINE W REFLEX MICROSCOPIC
Bilirubin Urine: NEGATIVE
Glucose, UA: NEGATIVE mg/dL
Hgb urine dipstick: NEGATIVE
Ketones, ur: NEGATIVE mg/dL
Leukocytes,Ua: NEGATIVE
Nitrite: NEGATIVE
Protein, ur: NEGATIVE mg/dL
Specific Gravity, Urine: 1.014 (ref 1.005–1.030)
pH: 7 (ref 5.0–8.0)

## 2021-06-07 LAB — ECHOCARDIOGRAM COMPLETE
Area-P 1/2: 4.83 cm2
Height: 67 in
MV M vel: 4.07 m/s
MV Peak grad: 66.3 mmHg
Radius: 0.4 cm
S' Lateral: 4.3 cm
Weight: 2204.6 oz

## 2021-06-07 LAB — LACTIC ACID, PLASMA
Lactic Acid, Venous: 1.6 mmol/L (ref 0.5–1.9)
Lactic Acid, Venous: 2.1 mmol/L (ref 0.5–1.9)

## 2021-06-07 LAB — APTT: aPTT: 86 seconds — ABNORMAL HIGH (ref 24–36)

## 2021-06-07 LAB — HEPARIN LEVEL (UNFRACTIONATED)
Heparin Unfractionated: 0.63 IU/mL (ref 0.30–0.70)
Heparin Unfractionated: 0.72 IU/mL — ABNORMAL HIGH (ref 0.30–0.70)
Heparin Unfractionated: 0.29 IU/mL — ABNORMAL LOW (ref 0.30–0.70)

## 2021-06-07 LAB — HEPATITIS B SURFACE ANTIBODY,QUALITATIVE: Hep B S Ab: NONREACTIVE

## 2021-06-07 LAB — TSH: TSH: 1.572 u[IU]/mL (ref 0.350–4.500)

## 2021-06-07 LAB — HIV ANTIBODY (ROUTINE TESTING W REFLEX): HIV Screen 4th Generation wRfx: NONREACTIVE

## 2021-06-07 LAB — MRSA NEXT GEN BY PCR, NASAL: MRSA by PCR Next Gen: NOT DETECTED

## 2021-06-07 MED ORDER — SODIUM CHLORIDE 0.9 % IV SOLN: INTRAVENOUS | Status: DC

## 2021-06-07 MED ORDER — NITROGLYCERIN 0.4 MG SL SUBL: 0.4000 mg | SUBLINGUAL_TABLET | SUBLINGUAL | Status: DC | PRN

## 2021-06-07 MED ORDER — PERFLUTREN LIPID MICROSPHERE
1.0000 mL | INTRAVENOUS | Status: AC | PRN
  Administered 2021-06-07: 4 mL via INTRAVENOUS

## 2021-06-07 MED ORDER — SODIUM CHLORIDE 0.9 % IV SOLN: 250.0000 mL | INTRAVENOUS | Status: DC | PRN

## 2021-06-07 MED ORDER — POTASSIUM CHLORIDE CRYS ER 20 MEQ PO TBCR: 40.0000 meq | EXTENDED_RELEASE_TABLET | ORAL | Status: DC

## 2021-06-07 MED ORDER — ALUM & MAG HYDROXIDE-SIMETH 200-200-20 MG/5ML PO SUSP
30.0000 mL | ORAL | Status: DC | PRN
  Administered 2021-06-07 – 2021-06-12 (×6): 30 mL via ORAL
  Filled 2021-06-07 (×6): qty 30

## 2021-06-07 MED ORDER — ENSURE ENLIVE PO LIQD
237.0000 mL | Freq: Three times a day (TID) | ORAL | Status: DC
  Administered 2021-06-07: 237 mL via ORAL

## 2021-06-07 MED ORDER — SODIUM CHLORIDE 0.9% FLUSH
3.0000 mL | Freq: Two times a day (BID) | INTRAVENOUS | Status: DC
  Administered 2021-06-08: 3 mL via INTRAVENOUS

## 2021-06-07 MED ORDER — ADULT MULTIVITAMIN W/MINERALS CH
1.0000 | ORAL_TABLET | Freq: Every day | ORAL | Status: DC
  Administered 2021-06-07 – 2021-06-15 (×8): 1 via ORAL
  Filled 2021-06-07 (×8): qty 1

## 2021-06-07 MED ORDER — POTASSIUM CHLORIDE 20 MEQ PO PACK
40.0000 meq | PACK | ORAL | Status: AC
  Administered 2021-06-07 (×2): 40 meq via ORAL
  Filled 2021-06-07 (×2): qty 2

## 2021-06-07 MED ORDER — SODIUM CHLORIDE 0.9% FLUSH: 3.0000 mL | INTRAVENOUS | Status: DC | PRN

## 2021-06-07 MED ORDER — PANTOPRAZOLE SODIUM 40 MG PO TBEC
40.0000 mg | DELAYED_RELEASE_TABLET | Freq: Every day | ORAL | Status: DC
  Administered 2021-06-07 – 2021-06-15 (×8): 40 mg via ORAL
  Filled 2021-06-07 (×9): qty 1

## 2021-06-07 NOTE — TOC Initial Note (Signed)
Transition of Care [facility]) - Initial/Assessment Note    Patient Details  Name: Shelby Trevino MRN: [id number] Date of Birth: 1950-04-30  [facility]) CM/SW Contact:    Beckie Busing, RN Phone Number:[phone]  06/07/2021, 2:38 PM  Clinical Narrative:                 [facility] consulted for heart failure screen. Patient is from home alone where she functions independently. Patient reports that she has had heart trouble in the past but does not recall being told that she has heart failure. Patient can not recall the name of her cardiologist stating that it has been a long time ago. PCP reported is Dr. Roxine Caddy at [facility]. Patient states that she has never been told that she needs to weigh herself but she does have a scale at home. Patient reports that she does follow low sodium diet. Patient reports that she is active with her physician and drives herself to her appointments. Patient states that her meds are affordable and there is no difficulty obtaining meds. Currently no TOC needs noted. TOC will continue to follow.   Expected Discharge Plan: Home/Self Care Barriers to Discharge: Continued Medical Work up   Patient Goals and CMS Choice Patient states their goals for this hospitalization and ongoing recovery are:: Wants to get better CMS Medicare.gov Compare Post Acute Care list provided to::  (n/a) Choice offered to / list presented to : NA  Expected Discharge Plan and Services Expected Discharge Plan: Home/Self Care In-house Referral: NA Discharge Planning Services: CM Consult Post Acute Care Choice: NA Living arrangements for the past 2 months: Single Family Home                 DME Arranged: N/A         HH Arranged: NA          Prior Living Arrangements/Services Living arrangements for the past 2 months: Single Family Home Lives with:: Self Patient language and need for interpreter reviewed:: Yes Do you feel safe going back to the place where you live?: Yes       Need for Family Participation in Patient Care: No (Comment) Care giver support system in place?: Yes (comment) Current home services:  (n/a) Criminal Activity/Legal Involvement Pertinent to Current Situation/Hospitalization: No - Comment as needed  Activities of Daily Living Home Assistive Devices/Equipment: Eyeglasses ADL Screening (condition at time of admission) Patient's cognitive ability adequate to safely complete daily activities?: Yes Is the patient deaf or have difficulty hearing?: No Does the patient have difficulty seeing, even when wearing glasses/contacts?: No Does the patient have difficulty concentrating, remembering, or making decisions?: No Patient able to express need for assistance with ADLs?: Yes Does the patient have difficulty dressing or bathing?: No Independently performs ADLs?: Yes (appropriate for developmental age) Does the patient have difficulty walking or climbing stairs?: Yes Weakness of Legs: Both Weakness of Arms/Hands: None  Permission Sought/Granted Permission sought to share information with : Family Supports Permission granted to share information with : No              Emotional Assessment Appearance:: Appears stated age Attitude/Demeanor/Rapport: Gracious Affect (typically observed): Pleasant, Quiet Orientation: : Oriented to Self, Oriented to Place, Oriented to  Time, Oriented to Situation Alcohol / Substance Use: Not Applicable    Admission diagnosis:  ACS (acute coronary syndrome) (HCC) [I24.9] Acute on chronic congestive heart failure, unspecified heart failure type 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

## 2021-06-07 NOTE — Progress Notes (Signed)
[facility] for heparin Indication: chest pain/ACS  Allergies  Allergen Reactions   Latex     Itching and rash    Patient Measurements: Height: 5\' 7"  (170.2 cm) Weight: 63.7 kg (140 lb 6.9 oz) IBW/kg (Calculated) : 61.6 Heparin Dosing Weight: 63.5 kg  Vital Signs: Temp: 97.9 F (36.6 C) (05/31 2238) Temp Source: Oral (06/01 0400) BP: 126/86 (06/01 0400) Pulse Rate: 91 (06/01 0400)  Labs: Recent Labs    06/06/21 1754 06/06/21 1757 06/06/21 1943 06/07/21 0258  HGB 11.4*  --   --   --   HCT 34.8*  --   --   --   PLT 365  --   --   --   LABPROT 14.4  --   --   --   INR 1.1  --   --   --   CREATININE 1.01*  --   --  1.06*  TROPONINIHS  --  546* 584*  --      Estimated Creatinine Clearance: 46.7 mL/min (A) (by C-G formula based on SCr of 1.06 mg/dL (H)).   Medical History: Past Medical History:  Diagnosis Date   AV nodal tachycardia ([place]) 2004   with radiofrequency ablation by Dr. Donnel Saxon in 2004   CAD (coronary artery disease)    80% stenosis in moderate size diagonal on cath in 2004. with last adenosine Cardiolite study in 2007 showing no abnormalities   Depression    Diabetes mellitus    Dyslipidemia    GERD (gastroesophageal reflux disease)    Hyperlipidemia    Hypertension    Mitral valve prolapse    Osteoarthritis    Peripheral neuropathy    Renal insufficiency     Medications:  Scheduled:   aspirin EC  81 mg Oral Daily   atorvastatin  20 mg Oral QHS   feeding supplement  237 mL Oral BID BM   furosemide  80 mg Intravenous Q6H   insulin aspart  0-5 Units Subcutaneous QHS   insulin aspart  0-9 Units Subcutaneous TID WC   losartan  25 mg Oral Daily   sodium chloride flush  3 mL Intravenous Q12H   Infusions:   sodium chloride     heparin 750 Units/hr (06/06/21 2238)    Assessment: 71 yo female presented with shortness of breath, leg swelling, and chest pain per MD note. Troponin elevated. Baseline INR  ordered.  6/1 AM update:  Heparin level therapeutic at 0.63-not crossing into Epic but showing on lab report server  Goal of Therapy:  Heparin level 0.3-0.7 units/ml Monitor platelets by anticoagulation protocol: Yes   Plan:  Cont heparin at 750 units/hr Heparin level in 8 hours  Narda Bonds, PharmD, [facility] Pharmacist Phone: [phone]

## 2021-06-07 NOTE — Progress Notes (Signed)
ANTICOAGULATION CONSULT NOTE   Pharmacy Consult for heparin Indication: chest pain/ACS  Allergies  Allergen Reactions   Latex     Itching and rash    Patient Measurements: Height: 5\' 7"  (170.2 cm) Weight: 62.5 kg (137 lb 12.6 oz) IBW/kg (Calculated) : 61.6 Heparin Dosing Weight: 63.5 kg  Vital Signs: Temp: 97.7 F (36.5 C) (06/01 1936) Temp Source: Oral (06/01 1936) BP: 111/86 (06/01 1936) Pulse Rate: 89 (06/01 1936)  Labs: Recent Labs    06/06/21 1754 06/06/21 1757 06/06/21 1943 06/07/21 0258 06/07/21 1250 06/07/21 2126  HGB 11.4*  --   --  10.9*  --   --   HCT 34.8*  --   --  32.0*  --   --   PLT 365  --   --  309  --   --   APTT  --   --   --  86*  --   --   LABPROT 14.4  --   --   --   --   --   INR 1.1  --   --   --   --   --   HEPARINUNFRC  --   --   --  0.63 0.72* 0.29*  CREATININE 1.01*  --   --  1.06*  --   --   TROPONINIHS  --  546* 584*  --   --   --      Estimated Creatinine Clearance: 46.7 mL/min (A) (by C-G formula based on SCr of 1.06 mg/dL (H)).   Medical History: Past Medical History:  Diagnosis Date   AV nodal tachycardia ([place]) 2004   with radiofrequency ablation by Dr. Donnel Saxon in 2004   CAD (coronary artery disease)    80% stenosis in moderate size diagonal on cath in 2004. with last adenosine Cardiolite study in 2007 showing no abnormalities   Depression    Diabetes mellitus    Dyslipidemia    GERD (gastroesophageal reflux disease)    Hyperlipidemia    Hypertension    Mitral valve prolapse    Osteoarthritis    Peripheral neuropathy    Renal insufficiency     Medications:  Scheduled:   aspirin EC  81 mg Oral Daily   atorvastatin  20 mg Oral QHS   feeding supplement  237 mL Oral TID BM   insulin aspart  0-5 Units Subcutaneous QHS   insulin aspart  0-9 Units Subcutaneous TID WC   multivitamin with minerals  1 tablet Oral Daily   pantoprazole  40 mg Oral Daily   potassium chloride  40 mEq Oral Q4H   sodium chloride flush  3 mL  Intravenous Q12H   sodium chloride flush  3 mL Intravenous Q12H   Infusions:   sodium chloride     sodium chloride     [START ON 06/08/2021] sodium chloride     heparin 650 Units/hr (06/07/21 1748)    Assessment: 71 yo female presented with shortness of breath, leg swelling, and chest pain per MD note. Troponin elevated.  PM update - Heparin level now very slightly subtherapeutic at 0.29 after rate decrease earlier today. CBC stable. No bleeding or issues with infusion per discussion with RN.  Goal of Therapy:  Heparin level 0.3-0.7 units/ml Monitor platelets by anticoagulation protocol: Yes   Plan:  Increase heparin infusion to 700 units/hr Recheck heparin level in 8 hrs Monitor daily CBC, s/sx bleeding   Arturo Morton, PharmD, BCPS Please check AMION for all [facility]  contact numbers Clinical Pharmacist 06/07/2021 10:08 PM

## 2021-06-07 NOTE — Progress Notes (Signed)
Heart Failure Stewardship Pharmacist Progress Note   PCP: [facility], [facility] Family PCP-Cardiologist: None   HPI:  71 y.o. female with history of SVT s/p AvNT s/p ablation 2004, moderate PVCs, prior catheterization with diagonal disease in 2008 and an angiogram in 2015 with moderate LAD stenosis, HTN, HLD, T2DM who presented with exertional chest pain and worsening shortness of breath, orthopnea, PND, poor appetite and fatigue for the last week. She had brushed off the symptoms as related to her many stressors including a recently broken down car and responsibilities related to caregiving.   Newly diagnosed HFrEF (EF 20-25%, RV mod reduced, mod MR, mod TR 06/07/21). 06/07/21 CT angio to r/o PE indicated extensive CAD in the LAD and small bilateral pleural effusions. Chest X-ray with cardiomegaly and suggestive of atelectasis/PNA. Plan for R/L [facility] tomorrow.   06/07/21 EKG with low voltage QRS, concerning for potential infiltrative cardiomyopathy. Lukewarm on exam and nauseated, concern for low-output.    Current HF Medications: Diuretic: furosemide 80 IV q6h x 4 doses Beta Blocker: none ACE/ARB/ARNI: none Aldosterone Antagonist: none SGLT2i: none  Prior to admission HF Medications: None  Pertinent Lab Values: Serum creatinine 1.06, BUN 28, Potassium 3.7, Sodium 136, BNP 2924, A1c 9.6% (02/2021, new pending), Lactic acid 1.6   Vital Signs: Weight: 137 lbs (admission weight: 140 lbs) Blood pressure: 90-120/65-80  Heart rate: 88  I/O: not documented  Medication Assistance / Insurance Benefits Check: Does the patient have prescription insurance?  No, SW to see Type of insurance plan: N/A  Does the patient qualify for medication assistance through manufacturers or grants?   Yes Eligible grants and/or patient assistance programs: pending, family size 1, income pending Medication assistance applications in progress: none  Medication assistance applications approved: none Approved medication  assistance renewals will be completed by: pending  Outpatient Pharmacy:  Prior to admission outpatient pharmacy: [facility]  Is the patient willing to use [facility] at discharge? Yes Is the patient willing to transition their outpatient pharmacy to utilize a [facility] outpatient pharmacy?   Pending   Assessment: 1. Acute systolic CHF (LVEF 0000000), due to unclear etiology. NYHA class III symptoms. - Needs R/L HC to better assess hemodynamics and etiology. -Consider LifeVest post-cath given reduced EF if ischemia identified -Consider sarcoid work-up if cath unrevealing  - Concern for low-output continue to hold beta-blockers for now.  - BP labile and concern for AKI (Scr ~0.75 in 2018). Continue to hold ACE/ARB/ARNI.  -K 5 on admission, now down to 3.7 with IV diuresis. Hold MRA until post cath.  -Check new A1c 6/2. Consider starting SGLT2i post-cath pending results.    Plan: 1) Medication changes recommended at this time: -Continue IV diuresis with furosemide 80 mg IV q6h x 4 doses. -Give KCl 40 mg PO x2 doses  2) Patient assistance: -Pending  3)  Education  - To be completed prior to discharge  Cathrine Muster, PharmD, [facility] PGY2 Cardiology Pharmacy Resident

## 2021-06-07 NOTE — Progress Notes (Addendum)
Found 2 ticks on patient while cleaning her up, one on the left buttcheek and one on the right hip. Areas are clean and without signs of infection, ticks were removed and placed in a sterile container, dated and timed and placed in her room. Will continue to monitor. MD Cristal Deer made aware.

## 2021-06-07 NOTE — TOC CM/SW Note (Signed)
HF TOC CM received referral to discuss medications. Pt states she gets her meds from [facility] and pays cash price. States she plans to sign up for Medicare Part D next enrollment. States she currently works full-time. Will have meds come up from [place] at dc. Unable to use [employer] for assistance. [place], Heart Failure TOC CM [phone]

## 2021-06-07 NOTE — Plan of Care (Signed)
  Problem: Education: Goal: Knowledge of General Education information will improve Description: Including pain rating scale, medication(s)/side effects and non-pharmacologic comfort measures Outcome: Progressing   Problem: Health Behavior/Discharge Planning: Goal: Ability to manage health-related needs will improve Outcome: Progressing   Problem: Clinical Measurements: Goal: Ability to maintain clinical measurements within normal limits will improve Outcome: Progressing Goal: Will remain free from infection Outcome: Progressing Goal: Diagnostic test results will improve Outcome: Progressing Goal: Respiratory complications will improve Outcome: Progressing Goal: Cardiovascular complication will be avoided Outcome: Progressing   Problem: Activity: Goal: Risk for activity intolerance will decrease Outcome: Progressing   Problem: Nutrition: Goal: Adequate nutrition will be maintained Outcome: Progressing   Problem: Coping: Goal: Level of anxiety will decrease Outcome: Progressing   Problem: Elimination: Goal: Will not experience complications related to bowel motility Outcome: Progressing Goal: Will not experience complications related to urinary retention Outcome: Progressing   Problem: Pain Managment: Goal: General experience of comfort will improve Outcome: Progressing   Problem: Safety: Goal: Ability to remain free from injury will improve Outcome: Progressing   Problem: Skin Integrity: Goal: Risk for impaired skin integrity will decrease Outcome: Progressing   Problem: Education: Goal: Ability to describe self-care measures that may prevent or decrease complications (Diabetes Survival Skills Education) will improve Outcome: Progressing Goal: Individualized Educational Video(s) Outcome: Progressing   Problem: Coping: Goal: Ability to adjust to condition or change in health will improve Outcome: Progressing   Problem: Fluid Volume: Goal: Ability to  maintain a balanced intake and output will improve Outcome: Progressing   Problem: Health Behavior/Discharge Planning: Goal: Ability to identify and utilize available resources and services will improve Outcome: Progressing Goal: Ability to manage health-related needs will improve Outcome: Progressing   Problem: Metabolic: Goal: Ability to maintain appropriate glucose levels will improve Outcome: Progressing   Problem: Nutritional: Goal: Maintenance of adequate nutrition will improve Outcome: Progressing Goal: Progress toward achieving an optimal weight will improve Outcome: Progressing   Problem: Skin Integrity: Goal: Risk for impaired skin integrity will decrease Outcome: Progressing   Problem: Tissue Perfusion: Goal: Adequacy of tissue perfusion will improve Outcome: Progressing   Problem: Education: Goal: Ability to demonstrate management of disease process will improve Outcome: Progressing Goal: Ability to verbalize understanding of medication therapies will improve Outcome: Progressing Goal: Individualized Educational Video(s) Outcome: Progressing   Problem: Activity: Goal: Capacity to carry out activities will improve Outcome: Progressing   Problem: Cardiac: Goal: Ability to achieve and maintain adequate cardiopulmonary perfusion will improve Outcome: Progressing   Problem: Education: Goal: Understanding of CV disease, CV risk reduction, and recovery process will improve Outcome: Progressing Goal: Individualized Educational Video(s) Outcome: Progressing   Problem: Activity: Goal: Ability to return to baseline activity level will improve Outcome: Progressing

## 2021-06-07 NOTE — Progress Notes (Signed)
[place] for Heparin Indication: chest pain/ACS  Allergies  Allergen Reactions   Latex     Itching and rash    Patient Measurements: Height: 5\' 7"  (170.2 cm) Weight: 62.5 kg (137 lb 12.6 oz) IBW/kg (Calculated) : 61.6  Heparin Dosing Weight: 64 kg  Vital Signs: Temp: 97.8 F (36.6 C) (06/01 1200) Temp Source: Oral (06/01 1200) BP: 105/80 (06/01 1102) Pulse Rate: 89 (06/01 1102)  Labs: Recent Labs    06/06/21 1754 06/06/21 1757 06/06/21 1943 06/07/21 0258 06/07/21 1250  HGB 11.4*  --   --  10.9*  --   HCT 34.8*  --   --  32.0*  --   PLT 365  --   --  309  --   APTT  --   --   --  86*  --   LABPROT 14.4  --   --   --   --   INR 1.1  --   --   --   --   HEPARINUNFRC  --   --   --  0.63 0.72*  CREATININE 1.01*  --   --  1.06*  --   TROPONINIHS  --  546* 584*  --   --     Estimated Creatinine Clearance: 46.7 mL/min (A) (by C-G formula based on SCr of 1.06 mg/dL (H)).    Assessment: 71 yo female who presented with shortness of breath, leg swelling, and chest pain now being started on heparin for ACS/CP. No anticoagulation PTA. Pharmacy consulted to manage heparin.  Previous heparin level therapeutic 0.63 on 750 units/hr. Now heparin level supratherapeutic 0.72. CBC stable, no issues with heparin infusion or signs of bleeding.   Goal of Therapy:  Heparin level 0.3-0.7 units/ml Monitor platelets by anticoagulation protocol: Yes   Plan:  Decrease heparin infusion to 650 units/hr Check heparin level in 8 hours and daily while on heparin Continue to monitor H&H and platelets    Thank you for allowing pharmacy to be a part of this patient's care.  Ardyth Harps, PharmD Clinical Pharmacist

## 2021-06-07 NOTE — Progress Notes (Signed)
Echocardiogram 2D Echocardiogram has been performed.  Warren Lacy Zenon Leaf RDCS 06/07/2021, 10:40 AM  Patient began experiencing chest pain during echo, notified RN and stat EKG was performed. Notified Dr. Servando Salina to read echo stat at 10:40

## 2021-06-07 NOTE — Progress Notes (Signed)
Patient began complaining of chest pain around 1020 during echo, PA Kilroy paged per Amion and secure chatted MD Cristal Deer.EKG showed NSR and patient verbalized chest pain had diminished and she thinks was more related to swallowing her medications. MD Cristal Deer aware and PRN Nitro ordered if needed. Will continue to monitor.

## 2021-06-07 NOTE — Progress Notes (Signed)
Date and time results received: 06/07/21 -0055   Test: Lactic acid Critical Value: 2.1  Name of Provider Notified: Dr. Julianne Handler  Orders Received? Or Actions Taken?:  No new orders received

## 2021-06-07 NOTE — Progress Notes (Addendum)
Progress Note  Patient Name: Shelby Trevino Date of Encounter: 06/07/2021  [facility] Cardiologist: None Prior Dr. Dominga Ferry  Subjective   Admitted this AM by cardiology fellow. 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 Hasn't been eating well recently, use to pick up food from a restaurant after caretaking but recently has just been going home as she feels tired. Noted more shortness of breath, limited exertional ability, and LE edema.   We discussed how her heart may be involved in all of this, recommendations for testing and management. See below.  Inpatient Medications    Scheduled Meds:  aspirin EC  81 mg Oral Daily   atorvastatin  20 mg Oral QHS   feeding supplement  237 mL Oral BID BM   furosemide  80 mg Intravenous Q6H   insulin aspart  0-5 Units Subcutaneous QHS   insulin aspart  0-9 Units Subcutaneous TID WC   sodium chloride flush  3 mL Intravenous Q12H   Continuous Infusions:  sodium chloride     heparin 750 Units/hr (06/07/21 0400)   PRN Meds: sodium chloride, acetaminophen, ondansetron (ZOFRAN) IV, sodium chloride flush   Vital Signs    Vitals:   06/07/21 0100 06/07/21 0400 06/07/21 0622 06/07/21 0728  BP: 112/74 126/86  95/66  Pulse: 88 91  84  Resp: 17 10  20   Temp:  98.5 F (36.9 C)    TempSrc:  Oral    SpO2: 96% 94%  96%  Weight:   62.5 kg   Height:        Intake/Output Summary (Last 24 hours) at 06/07/2021 0946 Last data filed at 06/07/2021 0400 Gross per 24 hour  Intake 58.25 ml  Output 900 ml  Net -841.75 ml      06/07/2021    6:22 AM 06/06/2021   10:38 PM 06/06/2021    5:25 PM  Last 3 Weights  Weight (lbs) 137 lb 12.6 oz 140 lb 6.9 oz 140 lb  Weight (kg) 62.5 kg 63.7 kg 63.504 kg      Telemetry    NSR, brief SVT this AM - Personally Reviewed  ECG    Sinus tach, PRWP - Personally Reviewed  Physical Exam   GEN: No acute distress.    Neck: JVD elevated at 45 degrees Cardiac: RRR, no murmurs, rubs, or gallops.  Respiratory: Clear to auscultation bilaterally but diminished at bilateral bases GI: Soft, nontender, non-distended  MS: lukewarm LE, 2+ pitting LE bilateral edema; No deformity. Neuro:  Nonfocal  Psych: Normal affect   Labs    High Sensitivity Troponin:   Recent Labs  Lab 06/06/21 1757 06/06/21 1943  TROPONINIHS 546* 584*     Chemistry Recent Labs  Lab 06/06/21 1754 06/07/21 0258  NA 137 136  K 5.0 3.7  CL 105 103  CO2 22 22  GLUCOSE 201* 226*  BUN 29* 28*  CREATININE 1.01* 1.06*  CALCIUM 9.2 9.0  GFRNONAA 59* 56*  ANIONGAP 10 11    Lipids No results for input(s): CHOL, TRIG, HDL, LABVLDL, LDLCALC, CHOLHDL in the last 168 hours.  Hematology Recent Labs  Lab 06/06/21 1754 06/07/21 0258  WBC 6.2 6.6  RBC 3.99 3.79*  HGB 11.4* 10.9*  HCT 34.8* 32.0*  MCV 87.2 84.4  MCH 28.6 28.8  MCHC 32.8 34.1  RDW 14.6 14.6  PLT 365 309   Thyroid  Recent Labs  Lab 06/07/21 0258  TSH 1.572    BNP Recent Labs  Lab 06/06/21 1757  BNP 2,924.0*    DDimer No results for input(s): DDIMER in the last 168 hours.   Radiology    DG Chest 2 View  Result Date: 06/06/2021 CLINICAL DATA:  Shortness of breath EXAM: CHEST - 2 VIEW COMPARISON:  12/28/2020 FINDINGS: Transverse diameter of heart is increased. Central pulmonary vessels are prominent. There are no signs of alveolar pulmonary edema. There is interval appearance of small bilateral pleural effusions. There are linear densities in the both lower lung fields. There is no pneumothorax. IMPRESSION: Cardiomegaly. Central pulmonary vessels are more prominent suggesting mild CHF. Small bilateral pleural effusions. There are linear densities in both lower lung fields suggesting atelectasis/pneumonia. Electronically Signed   By: Ernie Avena M.D.   On: 06/06/2021 18:03   CT Angio Chest PE W and/or Wo Contrast  Result Date: 06/06/2021 CLINICAL  DATA:  Short of breath, bilateral lower extremity edema for 1 week, history of tobacco abuse EXAM: CT ANGIOGRAPHY CHEST WITH CONTRAST TECHNIQUE: Multidetector CT imaging of the chest was performed using the standard protocol during bolus administration of intravenous contrast. Multiplanar CT image reconstructions and MIPs were obtained to evaluate the vascular anatomy. RADIATION DOSE REDUCTION: This exam was performed according to the departmental dose-optimization program which includes automated exposure control, adjustment of the mA and/or kV according to patient size and/or use of iterative reconstruction technique. CONTRAST:  37mL OMNIPAQUE IOHEXOL 350 MG/ML SOLN COMPARISON:  06/06/2021 FINDINGS: Cardiovascular: This is a technically adequate evaluation of the pulmonary vasculature. No filling defects or pulmonary emboli. The heart is enlarged without pericardial effusion. Normal caliber of the thoracic aorta. Atherosclerosis of the aortic arch and coronary vasculature, greatest in the LAD distribution. Mediastinum/Nodes: No enlarged mediastinal, hilar, or axillary lymph nodes. Thyroid gland, trachea, and esophagus demonstrate no significant findings. Lungs/Pleura: There are small bilateral pleural effusions. Bilateral lower lobe consolidation most consistent with atelectasis. No airspace disease or pneumothorax. Central airways are patent. Upper Abdomen: No acute abnormality. Musculoskeletal: No acute or destructive bony lesions. Reconstructed images demonstrate no additional findings. Review of the MIP images confirms the above findings. IMPRESSION: 1. No evidence of pulmonary embolus. 2. Cardiomegaly. Extensive coronary artery atherosclerosis greatest in the LAD distribution. 3. Small bilateral pleural effusions, with scattered bibasilar atelectasis. 4.  Aortic Atherosclerosis (ICD10-I70.0). Electronically Signed   By: Sharlet Salina M.D.   On: 06/06/2021 20:46    Cardiac Studies   Echo  pending  Patient Profile     71 y.o. female with PMH SVT s/p ablation, small vessel CAD, hypertension, hyperlipidemia, who presented with new heart failure and elevated troponin.  Assessment & Plan    Acute heart failure, unclear if systolic or diastolic -BNP elevated, imaging with small bilateral pleural effusions -agree with holding beta blocker with acute presentation -blood pressure soft, no room to add arb today, discontinued -she is perfusing LE but lukewarm -continue diuresis for decongestion, monitor renal function  Chest pain, elevated troponin -agree with heparin, aspirin, statin -needs to be able to comfortably lie flat, suspect cath may need to wait until she can tolerate. No cath today, will reassess to see if she can tolerate tomorrow -has coronary calcification on CTPE, likely has some degree of CAD  Risks and benefits of cardiac catheterization have been discussed with the patient.  These include bleeding, infection, kidney damage, stroke, heart attack, death.  The patient understands these risks and is willing to proceed.  For questions or updates, please contact [facility] Please consult [email or web address] for contact info under        Signed, Jodelle Red, MD  06/07/2021, 9:46 AM

## 2021-06-07 NOTE — H&P (Signed)
Cardiology Admission History and Physical:   Patient ID: Shelby Trevino MRN: [id number]; DOB: 12-31-1950   Admission date: 06/06/2021  PCP:  [facility], [place] Providers Cardiologist:  None        Chief Complaint:  shortness of breath, chest pain, swelling  Patient Profile:   Shelby Trevino is a 71 y.o. female with history of AvNR s/p ablation, CAD small vessel 2004, HTN, HLD, T2DM who is being seen 06/07/2021 for the evaluation of index heart failure exacerbation.  History of Present Illness:   Shelby Trevino is a 71 y.o. female with history of AvNR s/p ablation, CAD small vessel 2004, HTN, HLD, T2DM who is being seen 06/07/2021 for the evaluation of index heart failure exacerbation.  She reports that she has been having chest pain and worsening shortness of breath, orthopnea, PND, and fatigue for the last week.  This has been associated with poor appetite and exertional chest pain that subsides with rest.  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  The symptoms were progressive and her daughter convinced her to come to the emergency room this morning.  She cannot recall a severe initiating episode of chest pain.  Last seen by cardiology in 2018 with atypical chest pain having undergone vasodilator MPI with prior catheterization with diagonal disease in 2008 and an angiogram in 2015 with moderate LAD stenosis.  She does have history of SVT with prior AVNRT ablation in 2004 and moderate premature ventricular contractions.   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 Donnel Saxon in 2004   CAD (coronary artery disease)    80% stenosis in moderate size diagonal on cath in 2004. with last adenosine Cardiolite study in 2007 showing no abnormalities   Depression     Diabetes mellitus    Dyslipidemia    GERD (gastroesophageal reflux disease)    Hyperlipidemia    Hypertension    Mitral valve prolapse    Osteoarthritis    Peripheral neuropathy    Renal insufficiency     Past Surgical History:  Procedure Laterality Date   CARDIAC CATHETERIZATION  01/21/2002   80% stenosis of moderate sized diagonal branch   CHOLECYSTECTOMY  1978   LEFT BREAST BIOPSY     By Dr. Anthony Sar showed fibrocystic changes   LEFT HEART CATHETERIZATION WITH CORONARY ANGIOGRAM N/A 12/28/2013   Procedure: LEFT HEART CATHETERIZATION WITH CORONARY ANGIOGRAM;  Surgeon: Jettie Booze, MD;  Location: [facility] CATH LAB;  Service: Cardiovascular;  Laterality: N/A;     Medications Prior to Admission: Prior to Admission medications   Medication Sig Start Date End Date Taking? Authorizing Provider  ascorbic acid (VITAMIN C) 500 MG tablet Take 1 tablet by mouth daily.   Yes [provider]  aspirin EC 81 MG tablet Take 81 mg by mouth daily.     Yes [provider]  atorvastatin (LIPITOR) 20 MG tablet Take 20 mg by mouth at bedtime.   Yes [provider]  Cholecalciferol (VITAMIN D3) 5000 UNITS CAPS Take 2,000 Units by mouth daily.   Yes [provider]  clotrimazole-betamethasone (LOTRISONE) cream Apply 1 application. topically 2 (two) times daily. 04/25/21 04/25/22 Yes [provider]  estradiol (ESTRACE) 0.1 MG/GM vaginal cream Place vaginally. 03/22/21 03/22/22 Yes [provider]  metFORMIN (  GLUCOPHAGE-XR) 500 MG 24 hr tablet Take 1 tablet (500 mg total) by mouth 2 (two) times daily. 12/30/13  Yes Jettie Booze, MD  Methenamine-Sodium Salicylate (AZO URINARY TRACT DEFENSE PO) Take 2 capsules by mouth daily.   Yes [provider]  Multiple Vitamins-Minerals (CENTRUM SILVER PO) Take 1 tablet by mouth daily.     Yes [provider]  vitamin E 200 UNIT capsule Take by mouth.   Yes [provider]      Allergies:    Allergies  Allergen Reactions   Latex     Itching and rash    Social History:   Social History   Socioeconomic History   Marital status: Divorced    Spouse name: Not on file   Number of children: Not on file   Years of education: Not on file   Highest education level: Not on file  Occupational History   Not on file  Tobacco Use   Smoking status: Former    Packs/day: 0.25    Years: 21.00    Pack years: 5.25    Types: Cigarettes    Start date: 06/22/1966    Quit date: 07/23/1987    Years since quitting: 33.8   Smokeless tobacco: Never  Substance and Sexual Activity   Alcohol use: No    Alcohol/week: 0.0 standard drinks   Drug use: No   Sexual activity: Not on file  Other Topics Concern   Not on file  Social History Narrative   Has one son and one daughter.   Has five grandchildren.   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 3 brothers died with cancer.   Social Determinants of Health   Financial Resource Strain: Not on file  Food Insecurity: Not on file  Transportation Needs: Not on file  Physical Activity: Not on file  Stress: Not on file  Social Connections: Not on file  Intimate Partner Violence: Not on file    Family History:   The patient's family history includes Cancer in her brother and father; Pneumonia in her mother.    ROS:  Please see the history of present illness.  All other ROS reviewed and negative.     Physical Exam/Data:   Vitals:   06/06/21 2238 06/07/21 0000 06/07/21 0100 06/07/21 0400  BP: 112/85 120/82 112/74 126/86  Pulse: (!) 106 88 88 91  Resp: 18 18 17 10   Temp: 97.9 F (36.6 C)   98.5 F (36.9 C)  TempSrc: Oral   Oral  SpO2: 98% 100% 96% 94%  Weight: 63.7 kg     Height: 5\' 7"  (1.702 m)       Intake/Output Summary (Last 24 hours) at 06/07/2021 0521 Last data filed at 06/07/2021 0400 Gross per 24 hour  Intake 58.25 ml  Output 900 ml  Net -841.75 ml      06/06/2021   10:38 PM  06/06/2021    5:25 PM 02/28/2021    9:44 AM  Last 3 Weights  Weight (lbs) 140 lb 6.9 oz 140 lb 139 lb  Weight (kg) 63.7 kg 63.504 kg 63.05 kg     Body mass index is 21.99 kg/m.  General:  Well nourished, well developed, in no acute distress HEENT: normal Neck: JVP 15  cm H20 Vascular: No carotid bruits; Distal pulses 2+ bilaterally   Cardiac:  normal S1, S2; RRR; Diastolic murmur present LUSB. Lungs:  clear to auscultation bilaterally, no wheezing, rhonchi or rales  Abd:  soft, nontender, no hepatomegaly  Ext: 2+ edema, cool extremities Musculoskeletal:  No deformities, BUE and BLE strength normal and equal Skin: dry no skin breakdown Neuro:  CNs 2-12 intact, no focal abnormalities noted Psych:  Normal affect    EKG:  NSR with diffuse infarct pattern, NSST changes  Relevant CV Studies: Vasodilator MPI 2018 no ischemia LHC 2015, moderate nonobstructive LAD disease AVNRT ablation 2004  Laboratory Data:  High Sensitivity Troponin:   Recent Labs  Lab 06/06/21 1757 06/06/21 1943  TROPONINIHS 546* 584*      Chemistry Recent Labs  Lab 06/06/21 1754 06/07/21 0258  NA 137 136  K 5.0 3.7  CL 105 103  CO2 22 22  GLUCOSE 201* 226*  BUN 29* 28*  CREATININE 1.01* 1.06*  CALCIUM 9.2 9.0  GFRNONAA 59* 56*  ANIONGAP 10 11    No results for input(s): PROT, ALBUMIN, AST, ALT, ALKPHOS, BILITOT in the last 168 hours. Lipids No results for input(s): CHOL, TRIG, HDL, LABVLDL, LDLCALC, CHOLHDL in the last 168 hours. Hematology Recent Labs  Lab 06/06/21 1754  WBC 6.2  RBC 3.99  HGB 11.4*  HCT 34.8*  MCV 87.2  MCH 28.6  MCHC 32.8  RDW 14.6  PLT 365   Thyroid  Recent Labs  Lab 06/07/21 0258  TSH 1.572   BNP Recent Labs  Lab 06/06/21 1757  BNP 2,924.0*    DDimer No results for input(s): DDIMER in the last 168 hours.   Radiology/Studies:  DG Chest 2 View  Result Date: 06/06/2021 CLINICAL DATA:  Shortness of breath EXAM: CHEST - 2 VIEW COMPARISON:  12/28/2020  FINDINGS: Transverse diameter of heart is increased. Central pulmonary vessels are prominent. There are no signs of alveolar pulmonary edema. There is interval appearance of small bilateral pleural effusions. There are linear densities in the both lower lung fields. There is no pneumothorax. IMPRESSION: Cardiomegaly. Central pulmonary vessels are more prominent suggesting mild CHF. Small bilateral pleural effusions. There are linear densities in both lower lung fields suggesting atelectasis/pneumonia. Electronically Signed   By: Elmer Picker M.D.   On: 06/06/2021 18:03   CT Angio Chest PE W and/or Wo Contrast  Result Date: 06/06/2021 CLINICAL DATA:  Short of breath, bilateral lower extremity edema for 1 week, history of tobacco abuse EXAM: CT ANGIOGRAPHY CHEST WITH CONTRAST TECHNIQUE: Multidetector CT imaging of the chest was performed using the standard protocol during bolus administration of intravenous contrast. Multiplanar CT image reconstructions and MIPs were obtained to evaluate the vascular anatomy. RADIATION DOSE REDUCTION: This exam was performed according to the departmental dose-optimization program which includes automated exposure control, adjustment of the mA and/or kV according to patient size and/or use of iterative reconstruction technique. CONTRAST:  15mL OMNIPAQUE IOHEXOL 350 MG/ML SOLN COMPARISON:  06/06/2021 FINDINGS: Cardiovascular: This is a technically adequate evaluation of the pulmonary vasculature. No filling defects or pulmonary emboli. The heart is enlarged without pericardial effusion. Normal caliber of the thoracic aorta. Atherosclerosis of the aortic arch and coronary vasculature, greatest in the LAD distribution. Mediastinum/Nodes: No enlarged mediastinal, hilar, or axillary lymph nodes. Thyroid gland, trachea, and esophagus demonstrate no significant findings. Lungs/Pleura: There are small bilateral pleural effusions. Bilateral lower lobe consolidation most consistent  with atelectasis. No airspace disease or pneumothorax. Central airways are patent. Upper Abdomen: No acute abnormality. Musculoskeletal: No acute or destructive bony lesions. Reconstructed images demonstrate no additional findings. Review of the MIP images confirms the above findings. IMPRESSION: 1. No evidence of pulmonary embolus. 2. Cardiomegaly. Extensive coronary  artery atherosclerosis greatest in the LAD distribution. 3. Small bilateral pleural effusions, with scattered bibasilar atelectasis. 4.  Aortic Atherosclerosis (ICD10-I70.0). Electronically Signed   By: Randa Ngo M.D.   On: 06/06/2021 20:46     Assessment and Plan:   71 year old female with history of AVNRT ablation, nonobstructive CAD, atypical chest pain, diabetes, hypertension, hyperlipidemia presenting with acute heart failure syndrome.  She is markedly volume up with cool extremities.  Lactic acid is borderline (2.1) with mild acute kidney injury.  Unclear precipitant but she endorses exertional chest pain and ECG looks infarcted though she has had an abnormal EKG since at least 2015.  Infiltrative or nonishcemic cardiomyopathy remains on the differential.  I suspect demand ischemia with moderately elevated troponin in the setting of decompensated heart failure but, given recent angina, will treat for underlying NSTEMI/ACS.  She warrants invasive angiography this admission.  Borderline low output with cool extremities, borderline lactic acidosis, mild AKI, tachycardia.  Pulse index remains okay (125/85).  Favor decongestion with diuretics and introduce afterload reduction.  Once decongested can pursue angiography.  She is urinating in response to diuretics which is reassuring.  #Acute heart failure syndrome - Hold BB given borderline perfusion - Start losartan 25 mg daily; titrate as tolerated; run into entresto - MRA if she tolerates above - Lasix 60 mg IV q6h x 4 doses ordered; K 5 presentation - NPO if R/LHC  #Chest  pain #Elevated troponin - ASA, heparin infusion, statin  #Hx HTN - Hold home antihypertensives  #T2DM not on insulin therapy - SSI - SGLT2i favored at discharge.   Risk Assessment/Risk Scores:    TIMI Risk Score for Unstable Angina or Non-ST Elevation MI:   The patient's TIMI risk score is  , which indicates a  % risk of all cause mortality, new or recurrent myocardial infarction or need for urgent revascularization in the next 14 days.  New York Heart Association (NYHA) Functional Class NYHA Class IV     Severity of Illness: The appropriate patient status for this patient is INPATIENT. Inpatient status is judged to be reasonable and necessary in order to provide the required intensity of service to ensure the patient's safety. The patient's presenting symptoms, physical exam findings, and initial radiographic and laboratory data in the context of their chronic comorbidities is felt to place them at high risk for further clinical deterioration. Furthermore, it is not anticipated that the patient will be medically stable for discharge from the hospital within 2 midnights of admission.   * I certify that at the point of admission it is my clinical judgment that the patient will require inpatient hospital care spanning beyond 2 midnights from the point of admission due to high intensity of service, high risk for further deterioration and high frequency of surveillance required.*   For questions or updates, please contact [place] Please consult [email or web address] for contact info under     Signed, Delight Hoh, MD  06/07/2021 5:21 AM

## 2021-06-07 NOTE — Progress Notes (Signed)
Initial Nutrition Assessment  DOCUMENTATION CODES:   Non-severe (moderate) malnutrition in context of chronic illness  INTERVENTION:   - Increase Ensure Enlive po to TID, each supplement provides 350 kcal and 20 grams of protein  - MVI with minerals daily  - Liberalize diet to 2 gram sodium  - Encourage PO intake  NUTRITION DIAGNOSIS:   Moderate Malnutrition related to chronic illness as evidenced by mild fat depletion, moderate muscle depletion.  GOAL:   Patient will meet greater than or equal to 90% of their needs  MONITOR:   PO intake, Supplement acceptance, Labs, Weight trends, I & O's  REASON FOR ASSESSMENT:   Malnutrition Screening Tool    ASSESSMENT:   71 year old female who presented to the ED on 5/31 with SOB. PMH of CAD, HTN, HLD, T2DM, GERD, depression. Pt admitted with acute heart failure syndrome.  Spoke with pt at bedside. Pt reports poor appetite and poor PO intake due to nausea and stress. Pt states that her car broke down recently which has caused her a significant amount of stress around transportation and has prevented her from going out and purchasing lunch and dinner at restaurants like she usually does. She reports that she has been eating lunch if someone brings it to her at work but hasn't been eating dinner due to lack of transportation.  When things are going well for pt, she typically eats 2-3 meals daily. Breakfast is hit or miss and may include oatmeal. Lunch and dinner are usually takeout or at a restaurant. Pt reports she typically has a meat, starch, and vegetables.  Pt endorses recent weight loss. She reports that she usually weighs in the 150's. She knows that she has lost weight because her clothes are not fitting her properly. Reviewed weight history in chart which is very limited. The last available weight PTA is from 02/28/21 and is consistent with current weight. Prior to that, the next available weight is from 03/01/16. Pt currently with  edema to BLE so unsure of pt's true dry weight.  Pt meets criteria for malnutrition based on NFPE. Pt willing to consume oral nutrition supplements to aid in meeting kcal and protein needs. Will increase Ensure Enlive from BID to TID. Will also add daily MVI and liberalize diet to 2 gram sodium to provide more food choices.  Admit weight: 63.7 kg Current weight: 62.5 kg  Medications reviewed and include: Ensure Enlive BID, IV lasix, SSI, heparin drip  Labs reviewed: BUN 28, creatinine 1.06 CBG's: 146-174  UOP: 900 ml x 12 hours I/O's: -841 ml since admit  NUTRITION - FOCUSED PHYSICAL EXAM:  Flowsheet Row Most Recent Value  Orbital Region Moderate depletion  Upper Arm Region Mild depletion  Thoracic and Lumbar Region Mild depletion  Buccal Region Mild depletion  Temple Region Moderate depletion  Clavicle Bone Region Moderate depletion  Clavicle and Acromion Bone Region Moderate depletion  Scapular Bone Region Moderate depletion  Dorsal Hand Mild depletion  Patellar Region Mild depletion  Anterior Thigh Region Moderate depletion  Posterior Calf Region Moderate depletion  Edema (RD Assessment) Moderate  [BLE]  Hair Reviewed  [pt wearing wig]  Eyes Reviewed  Mouth Reviewed  Skin Reviewed  Nails Reviewed       Diet Order:   Diet Order             Diet 2 gram sodium Room service appropriate? Yes; Fluid consistency: Thin  Diet effective now  EDUCATION NEEDS:   Education needs have been addressed  Skin:  Skin Assessment: Reviewed RN Assessment  Last BM:  06/05/21  Height:   Ht Readings from Last 1 Encounters:  06/06/21 5\' 7"  (1.702 m)    Weight:   Wt Readings from Last 1 Encounters:  06/07/21 62.5 kg    BMI:  Body mass index is 21.58 kg/m.  Estimated Nutritional Needs:   Kcal:  1700-1900  Protein:  80-95 grams  Fluid:  1.7-1.9 L    08/07/21, MS, RD, LDN Inpatient Clinical Dietitian Please see AMiON for contact  information.

## 2021-06-08 ENCOUNTER — Encounter (HOSPITAL_COMMUNITY)
Admission: EM | Disposition: A | Discharge status: Skilled Nursing Facility | Payer: Self-pay | Source: Home / Self Care | Attending: Cardiology

## 2021-06-08 DIAGNOSIS — I255 Ischemic cardiomyopathy: Secondary | ICD-10-CM

## 2021-06-08 DIAGNOSIS — I249 Acute ischemic heart disease, unspecified: Secondary | ICD-10-CM

## 2021-06-08 DIAGNOSIS — I2510 Atherosclerotic heart disease of native coronary artery without angina pectoris: Secondary | ICD-10-CM | POA: Diagnosis not present

## 2021-06-08 DIAGNOSIS — I5041 Acute combined systolic (congestive) and diastolic (congestive) heart failure: Secondary | ICD-10-CM

## 2021-06-08 DIAGNOSIS — I5023 Acute on chronic systolic (congestive) heart failure: Secondary | ICD-10-CM | POA: Diagnosis not present

## 2021-06-08 DIAGNOSIS — I509 Heart failure, unspecified: Secondary | ICD-10-CM | POA: Diagnosis not present

## 2021-06-08 DIAGNOSIS — I214 Non-ST elevation (NSTEMI) myocardial infarction: Secondary | ICD-10-CM | POA: Diagnosis not present

## 2021-06-08 DIAGNOSIS — I471 Supraventricular tachycardia: Secondary | ICD-10-CM | POA: Diagnosis not present

## 2021-06-08 HISTORY — PX: RIGHT/LEFT HEART CATH AND CORONARY ANGIOGRAPHY: CATH118266

## 2021-06-08 LAB — URINE DRUGS OF ABUSE SCREEN W ALC, ROUTINE (REF LAB)
Amphetamines, Urine: NEGATIVE ng/mL
Barbiturate, Ur: NEGATIVE ng/mL
Benzodiazepine Quant, Ur: NEGATIVE ng/mL
Cannabinoid Quant, Ur: NEGATIVE ng/mL
Cocaine (Metab.): NEGATIVE ng/mL
Ethanol U, Quan: NEGATIVE %
Methadone Screen, Urine: NEGATIVE ng/mL
Opiate Quant, Ur: NEGATIVE ng/mL
Phencyclidine, Ur: NEGATIVE ng/mL
Propoxyphene, Urine: NEGATIVE ng/mL

## 2021-06-08 LAB — POCT I-STAT EG7
Acid-Base Excess: 1 mmol/L (ref 0.0–2.0)
Bicarbonate: 25.5 mmol/L (ref 20.0–28.0)
Calcium, Ion: 1.16 mmol/L (ref 1.15–1.40)
HCT: 34 % — ABNORMAL LOW (ref 36.0–46.0)
Hemoglobin: 11.6 g/dL — ABNORMAL LOW (ref 12.0–15.0)
O2 Saturation: 55 %
Potassium: 4.1 mmol/L (ref 3.5–5.1)
Sodium: 137 mmol/L (ref 135–145)
TCO2: 27 mmol/L (ref 22–32)
pCO2, Ven: 40.5 mmHg — ABNORMAL LOW (ref 44–60)
pH, Ven: 7.407 (ref 7.25–7.43)
pO2, Ven: 29 mmHg — CL (ref 32–45)

## 2021-06-08 LAB — HEMOGLOBIN A1C
Hgb A1c MFr Bld: 7.6 % — ABNORMAL HIGH (ref 4.8–5.6)
Mean Plasma Glucose: 171.42 mg/dL

## 2021-06-08 LAB — CBC
HCT: 35.4 % — ABNORMAL LOW (ref 36.0–46.0)
Hemoglobin: 11.6 g/dL — ABNORMAL LOW (ref 12.0–15.0)
MCH: 28 pg (ref 26.0–34.0)
MCHC: 32.8 g/dL (ref 30.0–36.0)
MCV: 85.3 fL (ref 80.0–100.0)
Platelets: 343 10*3/uL (ref 150–400)
RBC: 4.15 MIL/uL (ref 3.87–5.11)
RDW: 14.6 % (ref 11.5–15.5)
WBC: 7 10*3/uL (ref 4.0–10.5)
nRBC: 0 % (ref 0.0–0.2)

## 2021-06-08 LAB — BASIC METABOLIC PANEL
Anion gap: 12 (ref 5–15)
BUN: 28 mg/dL — ABNORMAL HIGH (ref 8–23)
CO2: 24 mmol/L (ref 22–32)
Calcium: 8.8 mg/dL — ABNORMAL LOW (ref 8.9–10.3)
Chloride: 100 mmol/L (ref 98–111)
Creatinine, Ser: 1.17 mg/dL — ABNORMAL HIGH (ref 0.44–1.00)
GFR, Estimated: 50 mL/min — ABNORMAL LOW (ref >60)
Glucose, Bld: 138 mg/dL — ABNORMAL HIGH (ref 70–99)
Potassium: 4.9 mmol/L (ref 3.5–5.1)
Sodium: 136 mmol/L (ref 135–145)

## 2021-06-08 LAB — GLUCOSE, CAPILLARY
Glucose-Capillary: 110 mg/dL — ABNORMAL HIGH (ref 70–99)
Glucose-Capillary: 113 mg/dL — ABNORMAL HIGH (ref 70–99)
Glucose-Capillary: 154 mg/dL — ABNORMAL HIGH (ref 70–99)
Glucose-Capillary: 233 mg/dL — ABNORMAL HIGH (ref 70–99)

## 2021-06-08 LAB — HCV INTERPRETATION

## 2021-06-08 LAB — MAGNESIUM: Magnesium: 1.9 mg/dL (ref 1.7–2.4)

## 2021-06-08 LAB — HEPARIN LEVEL (UNFRACTIONATED)
Heparin Unfractionated: 0.43 IU/mL (ref 0.30–0.70)
Heparin Unfractionated: 0.46 IU/mL (ref 0.30–0.70)

## 2021-06-08 LAB — HCV AB W REFLEX TO QUANT PCR: HCV Ab: NONREACTIVE

## 2021-06-08 SURGERY — RIGHT/LEFT HEART CATH AND CORONARY ANGIOGRAPHY
Anesthesia: LOCAL

## 2021-06-08 MED ORDER — DIGOXIN 125 MCG PO TABS
0.1250 mg | ORAL_TABLET | Freq: Every day | ORAL | Status: DC
  Administered 2021-06-08 – 2021-06-15 (×8): 0.125 mg via ORAL
  Filled 2021-06-08 (×8): qty 1

## 2021-06-08 MED ORDER — ONDANSETRON HCL 4 MG/2ML IJ SOLN
4.0000 mg | Freq: Four times a day (QID) | INTRAMUSCULAR | Status: DC | PRN
  Administered 2021-06-09 – 2021-06-14 (×4): 4 mg via INTRAVENOUS
  Filled 2021-06-08 (×4): qty 2

## 2021-06-08 MED ORDER — SODIUM CHLORIDE 0.9 % IV SOLN: 250.0000 mL | INTRAVENOUS | Status: DC | PRN

## 2021-06-08 MED ORDER — LIDOCAINE HCL (PF) 1 % IJ SOLN
INTRAMUSCULAR | Status: AC
  Filled 2021-06-08: qty 30

## 2021-06-08 MED ORDER — VERAPAMIL HCL 2.5 MG/ML IV SOLN
INTRAVENOUS | Status: DC | PRN
  Administered 2021-06-08: 10 mL via INTRA_ARTERIAL

## 2021-06-08 MED ORDER — VERAPAMIL HCL 2.5 MG/ML IV SOLN
INTRAVENOUS | Status: AC
  Filled 2021-06-08: qty 2

## 2021-06-08 MED ORDER — LABETALOL HCL 5 MG/ML IV SOLN: 10.0000 mg | INTRAVENOUS | Status: DC | PRN

## 2021-06-08 MED ORDER — ACETAMINOPHEN 325 MG PO TABS: 650.0000 mg | ORAL_TABLET | ORAL | Status: DC | PRN

## 2021-06-08 MED ORDER — SPIRONOLACTONE 12.5 MG HALF TABLET
12.5000 mg | ORAL_TABLET | Freq: Every day | ORAL | Status: DC
  Administered 2021-06-08: 12.5 mg via ORAL
  Filled 2021-06-08 (×2): qty 1

## 2021-06-08 MED ORDER — HEPARIN (PORCINE) 25000 UT/250ML-% IV SOLN: 700.0000 [IU]/h | INTRAVENOUS | Status: DC

## 2021-06-08 MED ORDER — IOHEXOL 350 MG/ML SOLN
INTRAVENOUS | Status: DC | PRN
  Administered 2021-06-08: 55 mL

## 2021-06-08 MED ORDER — HEPARIN (PORCINE) 25000 UT/250ML-% IV SOLN
700.0000 [IU]/h | INTRAVENOUS | Status: DC
  Administered 2021-06-08: 700 [IU]/h via INTRAVENOUS
  Filled 2021-06-08: qty 250

## 2021-06-08 MED ORDER — LIDOCAINE HCL (PF) 1 % IJ SOLN
INTRAMUSCULAR | Status: DC | PRN
  Administered 2021-06-08: 5 mL

## 2021-06-08 MED ORDER — HEPARIN SODIUM (PORCINE) 1000 UNIT/ML IJ SOLN
INTRAMUSCULAR | Status: DC | PRN
  Administered 2021-06-08: 3000 [IU] via INTRAVENOUS

## 2021-06-08 MED ORDER — MIDAZOLAM HCL 2 MG/2ML IJ SOLN
INTRAMUSCULAR | Status: DC | PRN
  Administered 2021-06-08: .5 mg via INTRAVENOUS

## 2021-06-08 MED ORDER — SODIUM CHLORIDE 0.9% FLUSH: 3.0000 mL | INTRAVENOUS | Status: DC | PRN

## 2021-06-08 MED ORDER — MIDAZOLAM HCL 2 MG/2ML IJ SOLN
INTRAMUSCULAR | Status: AC
  Filled 2021-06-08: qty 2

## 2021-06-08 MED ORDER — HYDRALAZINE HCL 20 MG/ML IJ SOLN: 10.0000 mg | INTRAMUSCULAR | Status: AC | PRN

## 2021-06-08 MED ORDER — ENOXAPARIN SODIUM 40 MG/0.4ML IJ SOSY
40.0000 mg | PREFILLED_SYRINGE | INTRAMUSCULAR | Status: DC
  Administered 2021-06-08: 40 mg via SUBCUTANEOUS
  Filled 2021-06-08: qty 0.4

## 2021-06-08 MED ORDER — ASPIRIN 81 MG PO CHEW
81.0000 mg | CHEWABLE_TABLET | Freq: Every day | ORAL | Status: DC
  Administered 2021-06-09 – 2021-06-15 (×6): 81 mg via ORAL
  Filled 2021-06-08 (×7): qty 1

## 2021-06-08 MED ORDER — ATORVASTATIN CALCIUM 80 MG PO TABS
80.0000 mg | ORAL_TABLET | Freq: Every day | ORAL | Status: DC
  Administered 2021-06-08 – 2021-06-14 (×7): 80 mg via ORAL
  Filled 2021-06-08 (×7): qty 1

## 2021-06-08 MED ORDER — HEPARIN (PORCINE) IN NACL 1000-0.9 UT/500ML-% IV SOLN
INTRAVENOUS | Status: DC | PRN
  Administered 2021-06-08 (×2): 500 mL

## 2021-06-08 MED ORDER — SODIUM CHLORIDE 0.9 % IV SOLN: INTRAVENOUS | Status: AC

## 2021-06-08 MED ORDER — HEPARIN SODIUM (PORCINE) 1000 UNIT/ML IJ SOLN
INTRAMUSCULAR | Status: AC
Start: 2021-06-08 — End: ?
  Filled 2021-06-08: qty 10

## 2021-06-08 MED ORDER — HEPARIN (PORCINE) IN NACL 1000-0.9 UT/500ML-% IV SOLN
INTRAVENOUS | Status: AC
  Filled 2021-06-08: qty 1000

## 2021-06-08 MED ORDER — SODIUM CHLORIDE 0.9% FLUSH
3.0000 mL | Freq: Two times a day (BID) | INTRAVENOUS | Status: DC
  Administered 2021-06-08 – 2021-06-15 (×11): 3 mL via INTRAVENOUS

## 2021-06-08 MED ORDER — FENTANYL CITRATE (PF) 100 MCG/2ML IJ SOLN
INTRAMUSCULAR | Status: DC | PRN
  Administered 2021-06-08: 25 ug via INTRAVENOUS

## 2021-06-08 MED ORDER — FUROSEMIDE 10 MG/ML IJ SOLN
80.0000 mg | Freq: Two times a day (BID) | INTRAMUSCULAR | Status: DC
  Administered 2021-06-08: 80 mg via INTRAVENOUS
  Filled 2021-06-08 (×2): qty 8

## 2021-06-08 MED ORDER — FENTANYL CITRATE (PF) 100 MCG/2ML IJ SOLN
INTRAMUSCULAR | Status: AC
  Filled 2021-06-08: qty 2

## 2021-06-08 SURGICAL SUPPLY — 12 items
BAND ZEPHYR COMPRESS 30 LONG (HEMOSTASIS) ×1 IMPLANT
CATH 5FR JL3.5 JR4 ANG PIG MP (CATHETERS) ×1 IMPLANT
CATH BALLN WEDGE 5F 110CM (CATHETERS) ×1 IMPLANT
GLIDESHEATH SLEND A-KIT 6F 22G (SHEATH) ×1 IMPLANT
GUIDEWIRE .025 260CM (WIRE) ×1 IMPLANT
GUIDEWIRE INQWIRE 1.5J.035X260 (WIRE) IMPLANT
INQWIRE 1.5J .035X260CM (WIRE) ×2
KIT HEART LEFT (KITS) ×2 IMPLANT
PACK CARDIAC CATHETERIZATION (CUSTOM PROCEDURE TRAY) ×2 IMPLANT
SHEATH GLIDE SLENDER 4/5FR (SHEATH) ×1 IMPLANT
TRANSDUCER W/STOPCOCK (MISCELLANEOUS) ×2 IMPLANT
TUBING CIL FLEX 10 FLL-RA (TUBING) ×2 IMPLANT

## 2021-06-08 NOTE — Progress Notes (Signed)
ANTICOAGULATION CONSULT NOTE  Pharmacy Consult for Heparin Indication: chest pain/ACS  Allergies  Allergen Reactions   Latex     Itching and rash    Patient Measurements: Height: 5\' 7"  (170.2 cm) Weight: 60.1 kg (132 lb 7.9 oz) IBW/kg (Calculated) : 61.6  Heparin Dosing Weight: 64 kg  Vital Signs: Temp: 97.8 F (36.6 C) (06/02 0735) Temp Source: Oral (06/02 0735) BP: 113/77 (06/02 0735) Pulse Rate: 79 (06/02 0735)  Labs: Recent Labs    06/06/21 1754 06/06/21 1754 06/06/21 1757 06/06/21 1943 06/07/21 0258 06/07/21 1250 06/07/21 2126 06/08/21 0232  HGB 11.4*  --   --   --  10.9*  --   --  11.6*  HCT 34.8*  --   --   --  32.0*  --   --  35.4*  PLT 365  --   --   --  309  --   --  343  APTT  --   --   --   --  86*  --   --   --   LABPROT 14.4  --   --   --   --   --   --   --   INR 1.1  --   --   --   --   --   --   --   HEPARINUNFRC  --    < >  --   --  0.63 0.72* 0.29* 0.43  CREATININE 1.01*  --   --   --  1.06*  --   --  1.17*  TROPONINIHS  --   --  546* 584*  --   --   --   --    < > = values in this interval not displayed.    Estimated Creatinine Clearance: 41.2 mL/min (A) (by C-G formula based on SCr of 1.17 mg/dL (H)).    Assessment: 71 yo female who presented with shortness of breath, leg swelling, and chest pain now being started on heparin for ACS/CP. No anticoagulation PTA. Pharmacy consulted to manage heparin.  Previous heparin level therapeutic 0.63 on 750 units/hr. Now heparin level supratherapeutic 0.72. CBC stable, no issues with heparin infusion or signs of bleeding.   Goal of Therapy:  Heparin level 0.3-0.7 units/ml Monitor platelets by anticoagulation protocol: Yes   Plan:  Continue heparin infusion at 700 units/hr Check heparin level in 8 hours and daily while on heparin Continue to monitor H&H and platelets    Thank you for allowing pharmacy to be a part of this patient's care.  61, PharmD Clinical  Pharmacist

## 2021-06-08 NOTE — Progress Notes (Signed)
Heart Failure Stewardship Pharmacist Progress Note   PCP: [facility], [facility] PCP-Cardiologist: None   HPI:  71 y.o. female with history of SVT s/p AvNT s/p ablation 2004, moderate PVCs, prior catheterization with diagonal disease in 2008 and an angiogram in 2015 with moderate LAD stenosis, HTN, HLD, T2DM who presented with exertional chest pain and worsening shortness of breath, orthopnea, PND, poor appetite and fatigue for the last week. She had brushed off the symptoms as related to her many stressors including a recently broken down car and responsibilities related to caregiving.   Newly diagnosed HFrEF (EF 20-25%, RV mod reduced, mod MR, mod TR 06/07/21). 06/07/21 CT angio to r/o PE indicated extensive CAD in the LAD and small bilateral pleural effusions. Chest X-ray with cardiomegaly and suggestive of atelectasis/PNA. Plan for R/L HC today.   06/07/21 EKG with low voltage QRS, concerning for potential infiltrative cardiomyopathy. Lukewarm on exam and nauseated, concern for low-output.    Current HF Medications: Diuretic: furosemide 80 IV q6h x 4 doses Beta Blocker: none ACE/ARB/ARNI: none Aldosterone Antagonist: none SGLT2i: none  Prior to admission HF Medications: None  Pertinent Lab Values: Serum creatinine 1.17, BUN 28, Potassium 4.9, Sodium 136, BNP 2924, A1c 7.6% (06/2021), Lactic acid 2.1>1.6   Vital Signs: Weight: 132 lbs (admission weight: 140 lbs) Blood pressure: 110/65-80  Heart rate: 78  I/O: 2.35L UOP/24h, net -1.36L   Medication Assistance / Insurance Benefits Check: Does the patient have prescription insurance?  No, SW to see Type of insurance plan: N/A  Does the patient qualify for medication assistance through manufacturers or grants?   Yes Eligible grants and/or patient assistance programs: pending, family size 1, income pending Medication assistance applications in progress: none  Medication assistance applications approved: none Approved medication  assistance renewals will be completed by: pending  Outpatient Pharmacy:  Prior to admission outpatient pharmacy: [facility]  Is the patient willing to use [facility] TOC pharmacy at discharge? Yes Is the patient willing to transition their outpatient pharmacy to utilize a [facility] outpatient pharmacy?   Pending   Assessment: 1. Acute systolic CHF (LVEF 20-25%), due to unclear etiology. NYHA class III symptoms. - Needs R/L HC to better assess hemodynamics and etiology. -Consider LifeVest post-cath given reduced EF if ischemia identified -Consider sarcoid work-up if cath unrevealing  - Concern for low-output continue to hold beta-blockers for now.  - BP labile and concern for AKI (Scr ~0.75 in 2018). Continue to hold ACE/ARB/ARNI.  -K 5 on admission, dropped to 3.7 with IV diuresis. K 4.9 today after 2 doses Kcl with furosemide 80 mg IV x2. Hold MRA until post cath.  -A1c elevated at 7.6%, recommend Jardiance 25 mg daily prior to discharge, patient still has Purewick in place.   Plan: 1) Medication changes recommended at this time: - Diuresis per hemodynamics after cath.  -Hold MRA until K drops back down  2) Patient assistance: -Pending  3)  Education  - To be completed prior to discharge  Drake Leach, PharmD, [facility] PGY2 Cardiology Pharmacy Resident

## 2021-06-08 NOTE — Progress Notes (Signed)
Mobility Specialist Progress Note    06/08/21 1228  Mobility  Activity Transferred from bed to chair  Level of Assistance Standby assist, set-up cues, supervision of patient - no hands on  Distance Ambulated (ft) 4 ft  Activity Response Tolerated well  $Mobility charge 1 Mobility   No complaints. Left with call bell in reach.   [place] Mobility Specialist

## 2021-06-08 NOTE — H&P (View-Only) (Signed)
Progress Note  Patient Name: Shelby Trevino Date of Encounter: 06/08/2021  [facility] Cardiologist: None Prior Dr. Zandra Trevino  Subjective   Continues to have intermittent indigestion, better with maalox. We reviewed plans for cath today--she asked me to speak to her son Shelby Trevino, which I did. He is up to date on the findings and agrees with the plan for cath.  Her breathing is improved but not baseline. She still has some orthopnea, but she should be able to tolerate cath with a wedge. She would prefer to move forward with cath today rather than try to diurese over the weekend and cath on Monday.  Inpatient Medications    Scheduled Meds:  aspirin EC  81 mg Oral Daily   atorvastatin  20 mg Oral QHS   feeding supplement  237 mL Oral TID BM   insulin aspart  0-5 Units Subcutaneous QHS   insulin aspart  0-9 Units Subcutaneous TID WC   multivitamin with minerals  1 tablet Oral Daily   pantoprazole  40 mg Oral Daily   sodium chloride flush  3 mL Intravenous Q12H   sodium chloride flush  3 mL Intravenous Q12H   Continuous Infusions:  sodium chloride     sodium chloride     sodium chloride 10 mL/hr at 06/08/21 0557   heparin 700 Units/hr (06/07/21 2230)   PRN Meds: sodium chloride, sodium chloride, acetaminophen, alum & mag hydroxide-simeth, nitroGLYCERIN, ondansetron (ZOFRAN) IV, sodium chloride flush, sodium chloride flush   Vital Signs    Vitals:   06/07/21 2343 06/08/21 0400 06/08/21 0534 06/08/21 0735  BP: 102/80 102/69 118/86 113/77  Pulse: 77 74 79 79  Resp: 18 19 20 17   Temp: 97.7 F (36.5 C)  97.9 F (36.6 C) 97.8 F (36.6 C)  TempSrc: Axillary  Oral Oral  SpO2: 96% 94% 99% 98%  Weight:   60.1 kg   Height:        Intake/Output Summary (Last 24 hours) at 06/08/2021 1007 Last data filed at 06/08/2021 0800 Gross per 24 hour  Intake 928.38 ml  Output 1450 ml  Net -521.62 ml      06/08/2021    5:34 AM 06/07/2021    6:22 AM 06/06/2021   10:38 PM  Last 3 Weights   Weight (lbs) 132 lb 7.9 oz 137 lb 12.6 oz 140 lb 6.9 oz  Weight (kg) 60.1 kg 62.5 kg 63.7 kg      Telemetry    NSR, brief SVT x2 this AM, one appears to have aberrancy - Personally Reviewed  ECG    Sinus rhythm, possible inferior and anterolateral infarct (not STEMI) - Personally Reviewed  Physical Exam   GEN: Well nourished, well developed in no acute distress NECK: JVD low neck at 45 degrees CARDIAC: regular rhythm, normal S1 and S2, no rubs or gallops. No murmur. VASCULAR: Radial pulses 2+ bilaterally.  RESPIRATORY:  Clear to auscultation without rales, wheezing or rhonchi, mildly diminished bilateral bases ABDOMEN: Soft, non-tender, non-distended MUSCULOSKELETAL:  Moves all 4 limbs independently SKIN: lukewarm distal extremities, 1+ bilateral LE edema NEUROLOGIC:  No focal neuro deficits noted. PSYCHIATRIC:  Normal affect    Labs    High Sensitivity Troponin:   Recent Labs  Lab 06/06/21 1757 06/06/21 1943  TROPONINIHS 546* 584*     Chemistry Recent Labs  Lab 06/06/21 1754 06/07/21 0258 06/08/21 0232  NA 137 136 136  K 5.0 3.7 4.9  CL 105 103 100  CO2 22 22 24  GLUCOSE 201* 226* 138*  BUN 29* 28* 28*  CREATININE 1.01* 1.06* 1.17*  CALCIUM 9.2 9.0 8.8*  GFRNONAA 59* 56* 50*  ANIONGAP 10 11 12     Lipids No results for input(s): CHOL, TRIG, HDL, LABVLDL, LDLCALC, CHOLHDL in the last 168 hours.  Hematology Recent Labs  Lab 06/06/21 1754 06/07/21 0258 06/08/21 0232  WBC 6.2 6.6 7.0  RBC 3.99 3.79* 4.15  HGB 11.4* 10.9* 11.6*  HCT 34.8* 32.0* 35.4*  MCV 87.2 84.4 85.3  MCH 28.6 28.8 28.0  MCHC 32.8 34.1 32.8  RDW 14.6 14.6 14.6  PLT 365 309 343   Thyroid  Recent Labs  Lab 06/07/21 0258  TSH 1.572    BNP Recent Labs  Lab 06/06/21 1757  BNP 2,924.0*    DDimer No results for input(s): DDIMER in the last 168 hours.   Radiology    DG Chest 2 View  Result Date: 06/06/2021 CLINICAL DATA:  Shortness of breath EXAM: CHEST - 2 VIEW  COMPARISON:  12/28/2020 FINDINGS: Transverse diameter of heart is increased. Central pulmonary vessels are prominent. There are no signs of alveolar pulmonary edema. There is interval appearance of small bilateral pleural effusions. There are linear densities in the both lower lung fields. There is no pneumothorax. IMPRESSION: Cardiomegaly. Central pulmonary vessels are more prominent suggesting mild CHF. Small bilateral pleural effusions. There are linear densities in both lower lung fields suggesting atelectasis/pneumonia. Electronically Signed   By: Elmer Picker M.D.   On: 06/06/2021 18:03   CT Angio Chest PE W and/or Wo Contrast  Result Date: 06/06/2021 CLINICAL DATA:  Short of breath, bilateral lower extremity edema for 1 week, history of tobacco abuse EXAM: CT ANGIOGRAPHY CHEST WITH CONTRAST TECHNIQUE: Multidetector CT imaging of the chest was performed using the standard protocol during bolus administration of intravenous contrast. Multiplanar CT image reconstructions and MIPs were obtained to evaluate the vascular anatomy. RADIATION DOSE REDUCTION: This exam was performed according to the departmental dose-optimization program which includes automated exposure control, adjustment of the mA and/or kV according to patient size and/or use of iterative reconstruction technique. CONTRAST:  27mL OMNIPAQUE IOHEXOL 350 MG/ML SOLN COMPARISON:  06/06/2021 FINDINGS: Cardiovascular: This is a technically adequate evaluation of the pulmonary vasculature. No filling defects or pulmonary emboli. The heart is enlarged without pericardial effusion. Normal caliber of the thoracic aorta. Atherosclerosis of the aortic arch and coronary vasculature, greatest in the LAD distribution. Mediastinum/Nodes: No enlarged mediastinal, hilar, or axillary lymph nodes. Thyroid gland, trachea, and esophagus demonstrate no significant findings. Lungs/Pleura: There are small bilateral pleural effusions. Bilateral lower lobe  consolidation most consistent with atelectasis. No airspace disease or pneumothorax. Central airways are patent. Upper Abdomen: No acute abnormality. Musculoskeletal: No acute or destructive bony lesions. Reconstructed images demonstrate no additional findings. Review of the MIP images confirms the above findings. IMPRESSION: 1. No evidence of pulmonary embolus. 2. Cardiomegaly. Extensive coronary artery atherosclerosis greatest in the LAD distribution. 3. Small bilateral pleural effusions, with scattered bibasilar atelectasis. 4.  Aortic Atherosclerosis (ICD10-I70.0). Electronically Signed   By: Randa Ngo M.D.   On: 06/06/2021 20:46   ECHOCARDIOGRAM COMPLETE  Result Date: 06/07/2021    ECHOCARDIOGRAM REPORT   Patient Name:   SARAGRACE SPROLES Date of Exam: 06/07/2021 Medical Rec #:  [id number]        Height:       67.0 in Accession #:    [id number]       Weight:       137.8 lb Date  of Birth:  1950-08-14        BSA:          1.726 m Patient Age:    71 years         BP:           107/79 mmHg Patient Gender: F                HR:           89 bpm. Exam Location:  Inpatient Procedure: 2D Echo, Color Doppler, Cardiac Doppler and Intracardiac            Opacification Agent Indications:    I42.9 Cardiomyopathy (unspecified)  History:        Patient has prior history of Echocardiogram examinations, most                 recent 12/14/2010. CAD; Risk Factors:Hypertension, Diabetes and                 Dyslipidemia.  Sonographer:    Raquel Sarna Senior RDCS Referring Phys: [id number] [place]  1. Left ventricular ejection fraction, by estimation, is 20 to 25%. The left ventricle has severely decreased function. The left ventricle demonstrates regional wall motion abnormalities (see scoring diagram/findings for description). Left ventricular diastolic parameters are consistent with Grade III diastolic dysfunction (restrictive). Elevated left ventricular end-diastolic pressure. The mid to apical antero-lateral, entire  anterior septum, apex and basal anterior walls are akinetic with findings consistent with mid LAD occlusion, with hypokinesis in the remaining myocardial wall segments.  2. Right ventricular systolic function is moderately reduced. The right ventricular size is mildly enlarged. There is normal pulmonary artery systolic pressure.  3. Left atrial size was mildly dilated.  4. Right atrial size was mildly dilated.  5. The mitral valve is normal in structure. Moderate mitral valve regurgitation. No evidence of mitral stenosis.  6. Tricuspid valve regurgitation is mild to moderate.  7. The aortic valve is tricuspid. Aortic valve regurgitation is not visualized. Aortic valve sclerosis/calcification is present, without any evidence of aortic stenosis.  8. The inferior vena cava is dilated in size with <50% respiratory variability, suggesting right atrial pressure of 15 mmHg. FINDINGS  Left Ventricle: Left ventricular ejection fraction, by estimation, is 20 to 25%. The left ventricle has severely decreased function. The left ventricle demonstrates regional wall motion abnormalities. Definity contrast agent was given IV to delineate the left ventricular endocardial borders. The left ventricular internal cavity size was normal in size. There is no left ventricular hypertrophy. Left ventricular diastolic parameters are consistent with Grade III diastolic dysfunction (restrictive). Elevated left ventricular end-diastolic pressure.  LV Wall Scoring: The antero-lateral wall, entire anterior septum, entire apex, and basal anterior segment are akinetic. The inferior wall, posterior wall, mid inferoseptal segment, mid anterior segment, and basal inferoseptal segment are hypokinetic. The mid to apical antero-lateral, entire anterior septum, apex and basal anterior walls are akinetic with findings consistent with mid LAD occlusion, with hypokinesis in the remaining myocardial wall segments. Right Ventricle: The right ventricular size  is mildly enlarged. No increase in right ventricular wall thickness. Right ventricular systolic function is moderately reduced. There is normal pulmonary artery systolic pressure. The tricuspid regurgitant velocity is 2.44 m/s, and with an assumed right atrial pressure of 8 mmHg, the estimated right ventricular systolic pressure is [id number] mmHg. Left Atrium: Left atrial size was mildly dilated. Right Atrium: Right atrial size was mildly dilated. Pericardium: There is no evidence of pericardial effusion. Mitral  Valve: The mitral valve is normal in structure. Moderate mitral valve regurgitation. No evidence of mitral valve stenosis. Tricuspid Valve: The tricuspid valve is normal in structure. Tricuspid valve regurgitation is mild to moderate. No evidence of tricuspid stenosis. Aortic Valve: The aortic valve is tricuspid. Aortic valve regurgitation is not visualized. Aortic valve sclerosis/calcification is present, without any evidence of aortic stenosis. Pulmonic Valve: The pulmonic valve was not well visualized. Pulmonic valve regurgitation is not visualized. No evidence of pulmonic stenosis. Aorta: The aortic root is normal in size and structure. Venous: The inferior vena cava is dilated in size with less than 50% respiratory variability, suggesting right atrial pressure of 15 mmHg. IAS/Shunts: No atrial level shunt detected by color flow Doppler. Additional Comments: There is a small pleural effusion in the left lateral region.  LEFT VENTRICLE PLAX 2D LVIDd:         4.80 cm   Diastology LVIDs:         4.30 cm   LV e' medial:    3.70 cm/s LV PW:         1.00 cm   LV E/e' medial:  28.6 LV IVS:        1.00 cm   LV e' lateral:   4.90 cm/s LVOT diam:     1.80 cm   LV E/e' lateral: 21.6 LV SV:         14 LV SV Index:   8 LVOT Area:     2.54 cm  RIGHT VENTRICLE RV S prime:     8.16 cm/s TAPSE (M-mode): 1.3 cm LEFT ATRIUM             Index        RIGHT ATRIUM           Index LA diam:        3.70 cm 2.14 cm/m   RA Area:      22.60 cm LA Vol (A2C):   61.5 ml 35.63 ml/m  RA Volume:   69.00 ml  39.97 ml/m LA Vol (A4C):   70.8 ml 41.02 ml/m LA Biplane Vol: 66.2 ml 38.35 ml/m  AORTIC VALVE LVOT Vmax:   45.80 cm/s LVOT Vmean:  30.800 cm/s LVOT VTI:    0.054 m  AORTA Ao Root diam: 2.50 cm Ao Asc diam:  3.00 cm MITRAL VALVE                  TRICUSPID VALVE MV Area (PHT): 4.83 cm       TR Peak grad:   23.8 mmHg MV Decel Time: 157 msec       TR Vmax:        244.00 cm/s MR Peak grad:    66.3 mmHg MR Mean grad:    46.0 mmHg    SHUNTS MR Vmax:         407.00 cm/s  Systemic VTI:  0.05 m MR Vmean:        321.0 cm/s   Systemic Diam: 1.80 cm MR PISA:         1.01 cm MR PISA Eff ROA: 8 mm MR PISA Radius:  0.40 cm MV E velocity: 106.00 cm/s MV A velocity: 44.90 cm/s MV E/A ratio:  2.36 Kardie Tobb DO Electronically signed by Berniece Salines DO Signature Date/Time: 06/07/2021/11:05:43 AM    Final     Cardiac Studies   Echo 06/07/21 1. Left ventricular ejection fraction, by estimation, is 20 to 25%. The  left ventricle has severely decreased  function. The left ventricle  demonstrates regional wall motion abnormalities (see scoring  diagram/findings for description). Left ventricular  diastolic parameters are consistent with Grade III diastolic dysfunction  (restrictive). Elevated left ventricular end-diastolic pressure. The mid  to apical antero-lateral, entire anterior septum, apex and basal anterior  walls are akinetic with findings  consistent with mid LAD occlusion, with hypokinesis in the remaining  myocardial wall segments.   2. Right ventricular systolic function is moderately reduced. The right  ventricular size is mildly enlarged. There is normal pulmonary artery  systolic pressure.   3. Left atrial size was mildly dilated.   4. Right atrial size was mildly dilated.   5. The mitral valve is normal in structure. Moderate mitral valve  regurgitation. No evidence of mitral stenosis.   6. Tricuspid valve regurgitation is mild to  moderate.   7. The aortic valve is tricuspid. Aortic valve regurgitation is not  visualized. Aortic valve sclerosis/calcification is present, without any  evidence of aortic stenosis.   8. The inferior vena cava is dilated in size with <50% respiratory  variability, suggesting right atrial pressure of 15 mmHg.   R/LHC pending today   Patient Profile     71 y.o. female with PMH SVT s/p ablation, small vessel CAD, hypertension, hyperlipidemia, who presented with new heart failure and elevated troponin. 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 Charted net negative 1.3 L but incompletely captured I/O -EF 20-25% with focal wall motion abnormalities -ideally would like more diuresis prior to [facility], but given upcoming weekend will perform today. Suspect she will still be significantly volume up but assessment of coronaries will be helpful to plan next steps. 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 See discussion yesterday as well. I did mention that we will not know what can be fixed (if needed) until the cath, and if there is severe disease that it may be amenable to PCI or it may require discussion with CT surgery for CABG. 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  Risks and benefits of cardiac catheterization have  been discussed with the patient.  These include bleeding, infection, kidney damage, stroke, heart attack, death.  The patient understands these risks and is willing to proceed.   Time Spent with Patient: I have spent a total of 55 minutes in patient care, including reviewing hospital notes, telemetry, EKGs, labs; discussing with the team; examining the patient; and establishing an assessment and plan that was discussed with the patient.  > 50% of time was spent in direct patient care.    For questions or updates, please contact [place] Please consult [email or web address] for contact info under     Signed, Buford Dresser, MD  06/08/2021, 10:07 AM

## 2021-06-08 NOTE — Interval H&P Note (Signed)
Cath Lab Visit (complete for each Cath Lab visit)  Clinical Evaluation Leading to the Procedure:   ACS: Yes.    Non-ACS:    Anginal Classification: No Symptoms  Anti-ischemic medical therapy: Minimal Therapy (1 class of medications)  Non-Invasive Test Results: No non-invasive testing performed  Prior CABG: No previous CABG      History and Physical Interval Note:  06/08/2021 2:01 PM  Shelby Trevino  has presented today for surgery, with the diagnosis of chf Nstemi.  The various methods of treatment have been discussed with the patient and family. After consideration of risks, benefits and other options for treatment, the patient has consented to  Procedure(s): RIGHT/LEFT HEART CATH AND CORONARY ANGIOGRAPHY (N/A) as a surgical intervention.  The patient's history has been reviewed, patient examined, no change in status, stable for surgery.  I have reviewed the patient's chart and labs.  Questions were answered to the patient's satisfaction.     Lyn Records III

## 2021-06-08 NOTE — Progress Notes (Signed)
Heart Failure Navigator Progress Note  Assessed for Heart & Vascular TOC clinic readiness.  Patient does not meet criteria due to Advanced heart Failure Team consulted. Rhae Hammock, BSN, RN Heart Failure Teacher, adult education Only

## 2021-06-08 NOTE — Progress Notes (Addendum)
Heart Failure Navigator Progress Note  Following this hospitalization to assess for HV TOC readiness.   New EF 20 - 25 %  R/L heart cath 06/20/21 @ 2pm.  Rhae Hammock, BSN, RN Heart Failure Teacher, adult education Only

## 2021-06-08 NOTE — Progress Notes (Signed)
1945: 3 CC removed from TR band (site clean dry no hematoma no bleeding)  2010: 3 CC removed from TR band (site clean dry no hematoma no bleeding)  2020: 1 CC removed from TR band (site clean dry no hematoma no bleeding)  2030:TR removed no bleeding gauze and tegaderm applied, site clean dry no hematoma no bleeding)

## 2021-06-08 NOTE — Progress Notes (Addendum)
ANTICOAGULATION CONSULT NOTE  Pharmacy Consult for Heparin Indication: chest pain/ACS  Allergies  Allergen Reactions   Latex     Itching and rash    Patient Measurements: Height: 5\' 7"  (170.2 cm) Weight: 60.1 kg (132 lb 7.9 oz) IBW/kg (Calculated) : 61.6  Heparin Dosing Weight: 64 kg  Vital Signs: Temp: 97.8 F (36.6 C) (06/02 1119) Temp Source: Oral (06/02 1119) BP: 112/78 (06/02 1449) Pulse Rate: 75 (06/02 1449)  Labs: Recent Labs    06/06/21 1754 06/06/21 1757 06/06/21 1943 06/07/21 0258 06/07/21 1250 06/07/21 2126 06/08/21 0232 06/08/21 1243 06/08/21 1436  HGB 11.4*  --   --  10.9*  --   --  11.6*  --  11.6*  HCT 34.8*  --   --  32.0*  --   --  35.4*  --  34.0*  PLT 365  --   --  309  --   --  343  --   --   APTT  --   --   --  86*  --   --   --   --   --   LABPROT 14.4  --   --   --   --   --   --   --   --   INR 1.1  --   --   --   --   --   --   --   --   HEPARINUNFRC  --   --   --  0.63   < > 0.29* 0.43 0.46  --   CREATININE 1.01*  --   --  1.06*  --   --  1.17*  --   --   TROPONINIHS  --  546* 584*  --   --   --   --   --   --    < > = values in this interval not displayed.     Estimated Creatinine Clearance: 41.2 mL/min (A) (by C-G formula based on SCr of 1.17 mg/dL (H)).    Assessment: 71 yo female who presented with shortness of breath, leg swelling, and chest pain now being started on heparin for ACS/CP. No anticoagulation PTA. Pharmacy consulted to manage heparin. Now s/p cath 6/2 showing ischemic cardiomyopathy with acute on chronic systolic HF, considering PCI on LAD and consulting advanced HF team. Pharmacy consulted to resume heparin 2 hrs post-TR band removal (sheath removed at 1454, TR/R-band remains in place per RN). CBC stable, no issues with bleeding per discussion with RN. Previously therapeutic at 0.46 on heparin at 700 units/hr.   Goal of Therapy:  Heparin level 0.3-0.7 units/ml Monitor platelets by anticoagulation protocol: Yes    Plan:  Will resume heparin infusion at previous therapeutic rate 700 units/hr 2hrs post-TR band/R-band removal - RN to confirm timing when removed Check heparin level 8hrs post-resumption Monitor daily CBC, s/sx bleeding    Arturo Morton, PharmD, BCPS Please check AMION for all [place] contact numbers Clinical Pharmacist 06/08/2021 3:10 PM  ADDENDUM - TR/R-band removed at 2030 per RN with no bleed issues. Heparin to be resumed at previous therapeutic rate with no bolus 2 hrs post-removal per consult at 2230. Of note, appears Lovenox prophylaxis was later ordered post-cath with 1 dose given at 1823. This has now been d/c'd with previous plans per Cards MD to resume heparin infusion.   Arturo Morton, PharmD, BCPS Please check AMION for all [place] contact numbers Clinical Pharmacist 06/08/2021 7:14 PM

## 2021-06-08 NOTE — Plan of Care (Signed)
  Problem: Education: Goal: Knowledge of General Education information will improve Description: Including pain rating scale, medication(s)/side effects and non-pharmacologic comfort measures Outcome: Progressing   Problem: Health Behavior/Discharge Planning: Goal: Ability to manage health-related needs will improve Outcome: Progressing   Problem: Clinical Measurements: Goal: Ability to maintain clinical measurements within normal limits will improve Outcome: Progressing Goal: Will remain free from infection Outcome: Progressing Goal: Diagnostic test results will improve Outcome: Progressing Goal: Respiratory complications will improve Outcome: Progressing Goal: Cardiovascular complication will be avoided Outcome: Progressing   Problem: Activity: Goal: Risk for activity intolerance will decrease Outcome: Progressing   Problem: Nutrition: Goal: Adequate nutrition will be maintained Outcome: Progressing   Problem: Coping: Goal: Level of anxiety will decrease Outcome: Progressing   Problem: Elimination: Goal: Will not experience complications related to bowel motility Outcome: Progressing Goal: Will not experience complications related to urinary retention Outcome: Progressing   Problem: Pain Managment: Goal: General experience of comfort will improve Outcome: Progressing   Problem: Safety: Goal: Ability to remain free from injury will improve Outcome: Progressing   Problem: Skin Integrity: Goal: Risk for impaired skin integrity will decrease Outcome: Progressing   Problem: Education: Goal: Ability to describe self-care measures that may prevent or decrease complications (Diabetes Survival Skills Education) will improve Outcome: Progressing Goal: Individualized Educational Video(s) Outcome: Progressing   Problem: Coping: Goal: Ability to adjust to condition or change in health will improve Outcome: Progressing   Problem: Fluid Volume: Goal: Ability to  maintain a balanced intake and output will improve Outcome: Progressing   Problem: Health Behavior/Discharge Planning: Goal: Ability to identify and utilize available resources and services will improve Outcome: Progressing Goal: Ability to manage health-related needs will improve Outcome: Progressing   Problem: Metabolic: Goal: Ability to maintain appropriate glucose levels will improve Outcome: Progressing   Problem: Nutritional: Goal: Maintenance of adequate nutrition will improve Outcome: Progressing Goal: Progress toward achieving an optimal weight will improve Outcome: Progressing   Problem: Skin Integrity: Goal: Risk for impaired skin integrity will decrease Outcome: Progressing   Problem: Tissue Perfusion: Goal: Adequacy of tissue perfusion will improve Outcome: Progressing   Problem: Education: Goal: Ability to demonstrate management of disease process will improve Outcome: Progressing Goal: Ability to verbalize understanding of medication therapies will improve Outcome: Progressing Goal: Individualized Educational Video(s) Outcome: Progressing   Problem: Activity: Goal: Capacity to carry out activities will improve Outcome: Progressing   Problem: Cardiac: Goal: Ability to achieve and maintain adequate cardiopulmonary perfusion will improve Outcome: Progressing   Problem: Education: Goal: Understanding of CV disease, CV risk reduction, and recovery process will improve Outcome: Progressing Goal: Individualized Educational Video(s) Outcome: Progressing   Problem: Activity: Goal: Ability to return to baseline activity level will improve Outcome: Progressing   Problem: Cardiovascular: Goal: Ability to achieve and maintain adequate cardiovascular perfusion will improve Outcome: Progressing Goal: Vascular access site(s) Level 0-1 will be maintained Outcome: Progressing   Problem: Health Behavior/Discharge Planning: Goal: Ability to safely manage  health-related needs after discharge will improve Outcome: Progressing   

## 2021-06-08 NOTE — Progress Notes (Signed)
Progress Note  Patient Name: Shelby Trevino Date of Encounter: 06/08/2021  [facility] Cardiologist: None Prior Dr. Zandra Abts  Subjective   Continues to have intermittent indigestion, better with maalox. We reviewed plans for cath today--she asked me to speak to her son Lanny Hurst, which I did. He is up to date on the findings and agrees with the plan for cath.  Her breathing is improved but not baseline. She still has some orthopnea, but she should be able to tolerate cath with a wedge. She would prefer to move forward with cath today rather than try to diurese over the weekend and cath on Monday.  Inpatient Medications    Scheduled Meds:  aspirin EC  81 mg Oral Daily   atorvastatin  20 mg Oral QHS   feeding supplement  237 mL Oral TID BM   insulin aspart  0-5 Units Subcutaneous QHS   insulin aspart  0-9 Units Subcutaneous TID WC   multivitamin with minerals  1 tablet Oral Daily   pantoprazole  40 mg Oral Daily   sodium chloride flush  3 mL Intravenous Q12H   sodium chloride flush  3 mL Intravenous Q12H   Continuous Infusions:  sodium chloride     sodium chloride     sodium chloride 10 mL/hr at 06/08/21 0557   heparin 700 Units/hr (06/07/21 2230)   PRN Meds: sodium chloride, sodium chloride, acetaminophen, alum & mag hydroxide-simeth, nitroGLYCERIN, ondansetron (ZOFRAN) IV, sodium chloride flush, sodium chloride flush   Vital Signs    Vitals:   06/07/21 2343 06/08/21 0400 06/08/21 0534 06/08/21 0735  BP: 102/80 102/69 118/86 113/77  Pulse: 77 74 79 79  Resp: 18 19 20 17   Temp: 97.7 F (36.5 C)  97.9 F (36.6 C) 97.8 F (36.6 C)  TempSrc: Axillary  Oral Oral  SpO2: 96% 94% 99% 98%  Weight:   60.1 kg   Height:        Intake/Output Summary (Last 24 hours) at 06/08/2021 1007 Last data filed at 06/08/2021 0800 Gross per 24 hour  Intake 928.38 ml  Output 1450 ml  Net -521.62 ml      06/08/2021    5:34 AM 06/07/2021    6:22 AM 06/06/2021   10:38 PM  Last 3 Weights   Weight (lbs) 132 lb 7.9 oz 137 lb 12.6 oz 140 lb 6.9 oz  Weight (kg) 60.1 kg 62.5 kg 63.7 kg      Telemetry    NSR, brief SVT x2 this AM, one appears to have aberrancy - Personally Reviewed  ECG    Sinus rhythm, possible inferior and anterolateral infarct (not STEMI) - Personally Reviewed  Physical Exam   GEN: Well nourished, well developed in no acute distress NECK: JVD low neck at 45 degrees CARDIAC: regular rhythm, normal S1 and S2, no rubs or gallops. No murmur. VASCULAR: Radial pulses 2+ bilaterally.  RESPIRATORY:  Clear to auscultation without rales, wheezing or rhonchi, mildly diminished bilateral bases ABDOMEN: Soft, non-tender, non-distended MUSCULOSKELETAL:  Moves all 4 limbs independently SKIN: lukewarm distal extremities, 1+ bilateral LE edema NEUROLOGIC:  No focal neuro deficits noted. PSYCHIATRIC:  Normal affect    Labs    High Sensitivity Troponin:   Recent Labs  Lab 06/06/21 1757 06/06/21 1943  TROPONINIHS 546* 584*     Chemistry Recent Labs  Lab 06/06/21 1754 06/07/21 0258 06/08/21 0232  NA 137 136 136  K 5.0 3.7 4.9  CL 105 103 100  CO2 22 22 24  GLUCOSE 201* 226* 138*  BUN 29* 28* 28*  CREATININE 1.01* 1.06* 1.17*  CALCIUM 9.2 9.0 8.8*  GFRNONAA 59* 56* 50*  ANIONGAP 10 11 12     Lipids No results for input(s): CHOL, TRIG, HDL, LABVLDL, LDLCALC, CHOLHDL in the last 168 hours.  Hematology Recent Labs  Lab 06/06/21 1754 06/07/21 0258 06/08/21 0232  WBC 6.2 6.6 7.0  RBC 3.99 3.79* 4.15  HGB 11.4* 10.9* 11.6*  HCT 34.8* 32.0* 35.4*  MCV 87.2 84.4 85.3  MCH 28.6 28.8 28.0  MCHC 32.8 34.1 32.8  RDW 14.6 14.6 14.6  PLT 365 309 343   Thyroid  Recent Labs  Lab 06/07/21 0258  TSH 1.572    BNP Recent Labs  Lab 06/06/21 1757  BNP 2,924.0*    DDimer No results for input(s): DDIMER in the last 168 hours.   Radiology    DG Chest 2 View  Result Date: 06/06/2021 CLINICAL DATA:  Shortness of breath EXAM: CHEST - 2 VIEW  COMPARISON:  12/28/2020 FINDINGS: Transverse diameter of heart is increased. Central pulmonary vessels are prominent. There are no signs of alveolar pulmonary edema. There is interval appearance of small bilateral pleural effusions. There are linear densities in the both lower lung fields. There is no pneumothorax. IMPRESSION: Cardiomegaly. Central pulmonary vessels are more prominent suggesting mild CHF. Small bilateral pleural effusions. There are linear densities in both lower lung fields suggesting atelectasis/pneumonia. Electronically Signed   By: Elmer Picker M.D.   On: 06/06/2021 18:03   CT Angio Chest PE W and/or Wo Contrast  Result Date: 06/06/2021 CLINICAL DATA:  Short of breath, bilateral lower extremity edema for 1 week, history of tobacco abuse EXAM: CT ANGIOGRAPHY CHEST WITH CONTRAST TECHNIQUE: Multidetector CT imaging of the chest was performed using the standard protocol during bolus administration of intravenous contrast. Multiplanar CT image reconstructions and MIPs were obtained to evaluate the vascular anatomy. RADIATION DOSE REDUCTION: This exam was performed according to the departmental dose-optimization program which includes automated exposure control, adjustment of the mA and/or kV according to patient size and/or use of iterative reconstruction technique. CONTRAST:  8mL OMNIPAQUE IOHEXOL 350 MG/ML SOLN COMPARISON:  06/06/2021 FINDINGS: Cardiovascular: This is a technically adequate evaluation of the pulmonary vasculature. No filling defects or pulmonary emboli. The heart is enlarged without pericardial effusion. Normal caliber of the thoracic aorta. Atherosclerosis of the aortic arch and coronary vasculature, greatest in the LAD distribution. Mediastinum/Nodes: No enlarged mediastinal, hilar, or axillary lymph nodes. Thyroid gland, trachea, and esophagus demonstrate no significant findings. Lungs/Pleura: There are small bilateral pleural effusions. Bilateral lower lobe  consolidation most consistent with atelectasis. No airspace disease or pneumothorax. Central airways are patent. Upper Abdomen: No acute abnormality. Musculoskeletal: No acute or destructive bony lesions. Reconstructed images demonstrate no additional findings. Review of the MIP images confirms the above findings. IMPRESSION: 1. No evidence of pulmonary embolus. 2. Cardiomegaly. Extensive coronary artery atherosclerosis greatest in the LAD distribution. 3. Small bilateral pleural effusions, with scattered bibasilar atelectasis. 4.  Aortic Atherosclerosis (ICD10-I70.0). Electronically Signed   By: Randa Ngo M.D.   On: 06/06/2021 20:46   ECHOCARDIOGRAM COMPLETE  Result Date: 06/07/2021    ECHOCARDIOGRAM REPORT   Patient Name:   KYLANI CONYER Date of Exam: 06/07/2021 Medical Rec #:  [id number]        Height:       67.0 in Accession #:    [id number]       Weight:       137.8 lb Date  of Birth:  [birth year]        BSA:          1.726 m Patient Age:    56 years         BP:           107/79 mmHg Patient Gender: F                HR:           89 bpm. Exam Location:  Inpatient Procedure: 2D Echo, Color Doppler, Cardiac Doppler and Intracardiac            Opacification Agent Indications:    I42.9 Cardiomyopathy (unspecified)  History:        Patient has prior history of Echocardiogram examinations, most                 recent 12/14/2010. CAD; Risk Factors:Hypertension, Diabetes and                 Dyslipidemia.  Sonographer:    Raquel Sarna Senior RDCS Referring Phys: [id number] [place]  1. Left ventricular ejection fraction, by estimation, is 20 to 25%. The left ventricle has severely decreased function. The left ventricle demonstrates regional wall motion abnormalities (see scoring diagram/findings for description). Left ventricular diastolic parameters are consistent with Grade III diastolic dysfunction (restrictive). Elevated left ventricular end-diastolic pressure. The mid to apical antero-lateral, entire  anterior septum, apex and basal anterior walls are akinetic with findings consistent with mid LAD occlusion, with hypokinesis in the remaining myocardial wall segments.  2. Right ventricular systolic function is moderately reduced. The right ventricular size is mildly enlarged. There is normal pulmonary artery systolic pressure.  3. Left atrial size was mildly dilated.  4. Right atrial size was mildly dilated.  5. The mitral valve is normal in structure. Moderate mitral valve regurgitation. No evidence of mitral stenosis.  6. Tricuspid valve regurgitation is mild to moderate.  7. The aortic valve is tricuspid. Aortic valve regurgitation is not visualized. Aortic valve sclerosis/calcification is present, without any evidence of aortic stenosis.  8. The inferior vena cava is dilated in size with <50% respiratory variability, suggesting right atrial pressure of 15 mmHg. FINDINGS  Left Ventricle: Left ventricular ejection fraction, by estimation, is 20 to 25%. The left ventricle has severely decreased function. The left ventricle demonstrates regional wall motion abnormalities. Definity contrast agent was given IV to delineate the left ventricular endocardial borders. The left ventricular internal cavity size was normal in size. There is no left ventricular hypertrophy. Left ventricular diastolic parameters are consistent with Grade III diastolic dysfunction (restrictive). Elevated left ventricular end-diastolic pressure.  LV Wall Scoring: The antero-lateral wall, entire anterior septum, entire apex, and basal anterior segment are akinetic. The inferior wall, posterior wall, mid inferoseptal segment, mid anterior segment, and basal inferoseptal segment are hypokinetic. The mid to apical antero-lateral, entire anterior septum, apex and basal anterior walls are akinetic with findings consistent with mid LAD occlusion, with hypokinesis in the remaining myocardial wall segments. Right Ventricle: The right ventricular size  is mildly enlarged. No increase in right ventricular wall thickness. Right ventricular systolic function is moderately reduced. There is normal pulmonary artery systolic pressure. The tricuspid regurgitant velocity is 2.44 m/s, and with an assumed right atrial pressure of 8 mmHg, the estimated right ventricular systolic pressure is [id number] mmHg. Left Atrium: Left atrial size was mildly dilated. Right Atrium: Right atrial size was mildly dilated. Pericardium: There is no evidence of pericardial effusion. Mitral  Valve: The mitral valve is normal in structure. Moderate mitral valve regurgitation. No evidence of mitral valve stenosis. Tricuspid Valve: The tricuspid valve is normal in structure. Tricuspid valve regurgitation is mild to moderate. No evidence of tricuspid stenosis. Aortic Valve: The aortic valve is tricuspid. Aortic valve regurgitation is not visualized. Aortic valve sclerosis/calcification is present, without any evidence of aortic stenosis. Pulmonic Valve: The pulmonic valve was not well visualized. Pulmonic valve regurgitation is not visualized. No evidence of pulmonic stenosis. Aorta: The aortic root is normal in size and structure. Venous: The inferior vena cava is dilated in size with less than 50% respiratory variability, suggesting right atrial pressure of 15 mmHg. IAS/Shunts: No atrial level shunt detected by color flow Doppler. Additional Comments: There is a small pleural effusion in the left lateral region.  LEFT VENTRICLE PLAX 2D LVIDd:         4.80 cm   Diastology LVIDs:         4.30 cm   LV e' medial:    3.70 cm/s LV PW:         1.00 cm   LV E/e' medial:  28.6 LV IVS:        1.00 cm   LV e' lateral:   4.90 cm/s LVOT diam:     1.80 cm   LV E/e' lateral: 21.6 LV SV:         14 LV SV Index:   8 LVOT Area:     2.54 cm  RIGHT VENTRICLE RV S prime:     8.16 cm/s TAPSE (M-mode): 1.3 cm LEFT ATRIUM             Index        RIGHT ATRIUM           Index LA diam:        3.70 cm 2.14 cm/m   RA Area:      22.60 cm LA Vol (A2C):   61.5 ml 35.63 ml/m  RA Volume:   69.00 ml  39.97 ml/m LA Vol (A4C):   70.8 ml 41.02 ml/m LA Biplane Vol: 66.2 ml 38.35 ml/m  AORTIC VALVE LVOT Vmax:   45.80 cm/s LVOT Vmean:  30.800 cm/s LVOT VTI:    0.054 m  AORTA Ao Root diam: 2.50 cm Ao Asc diam:  3.00 cm MITRAL VALVE                  TRICUSPID VALVE MV Area (PHT): 4.83 cm       TR Peak grad:   23.8 mmHg MV Decel Time: 157 msec       TR Vmax:        244.00 cm/s MR Peak grad:    66.3 mmHg MR Mean grad:    46.0 mmHg    SHUNTS MR Vmax:         407.00 cm/s  Systemic VTI:  0.05 m MR Vmean:        321.0 cm/s   Systemic Diam: 1.80 cm MR PISA:         1.01 cm MR PISA Eff ROA: 8 mm MR PISA Radius:  0.40 cm MV E velocity: 106.00 cm/s MV A velocity: 44.90 cm/s MV E/A ratio:  2.36 Kardie Tobb DO Electronically signed by Berniece Salines DO Signature Date/Time: 06/07/2021/11:05:43 AM    Final     Cardiac Studies   Echo 06/07/21 1. Left ventricular ejection fraction, by estimation, is 20 to 25%. The  left ventricle has severely decreased  function. The left ventricle  demonstrates regional wall motion abnormalities (see scoring  diagram/findings for description). Left ventricular  diastolic parameters are consistent with Grade III diastolic dysfunction  (restrictive). Elevated left ventricular end-diastolic pressure. The mid  to apical antero-lateral, entire anterior septum, apex and basal anterior  walls are akinetic with findings  consistent with mid LAD occlusion, with hypokinesis in the remaining  myocardial wall segments.   2. Right ventricular systolic function is moderately reduced. The right  ventricular size is mildly enlarged. There is normal pulmonary artery  systolic pressure.   3. Left atrial size was mildly dilated.   4. Right atrial size was mildly dilated.   5. The mitral valve is normal in structure. Moderate mitral valve  regurgitation. No evidence of mitral stenosis.   6. Tricuspid valve regurgitation is mild to  moderate.   7. The aortic valve is tricuspid. Aortic valve regurgitation is not  visualized. Aortic valve sclerosis/calcification is present, without any  evidence of aortic stenosis.   8. The inferior vena cava is dilated in size with <50% respiratory  variability, suggesting right atrial pressure of 15 mmHg.   R/LHC pending today   Patient Profile     71 y.o. female with PMH SVT s/p ablation, small vessel CAD, hypertension, hyperlipidemia, who presented with new heart failure and elevated troponin. 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 Charted net negative 1.3 L but incompletely captured I/O -EF 20-25% with focal wall motion abnormalities -ideally would like more diuresis prior to [facility], but given upcoming weekend will perform today. Suspect she will still be significantly volume up but assessment of coronaries will be helpful to plan next steps. 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 See discussion yesterday as well. I did mention that we will not know what can be fixed (if needed) until the cath, and if there is severe disease that it may be amenable to PCI or it may require discussion with CT surgery for CABG. 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  Risks and benefits of cardiac catheterization have  been discussed with the patient.  These include bleeding, infection, kidney damage, stroke, heart attack, death.  The patient understands these risks and is willing to proceed.   Time Spent with Patient: I have spent a total of 55 minutes in patient care, including reviewing hospital notes, telemetry, EKGs, labs; discussing with the team; examining the patient; and establishing an assessment and plan that was discussed with the patient.  > 50% of time was spent in direct patient care.    For questions or updates, please contact [place] Please consult [email or web address] for contact info under     Signed, Buford Dresser, MD  06/08/2021, 10:07 AM

## 2021-06-08 NOTE — Consult Note (Addendum)
Advanced Heart Failure Team Consult Note   Primary Physician: [facility] PCP-Cardiologist:  None  Reason for Consultation: Acute systolic CHF  HPI:    Shelby Trevino is seen today for evaluation of acute systolic CHF at the request of Dr. Cristal Deer. 71 y.o. female with history of AVNRT s/p ablation, CAD (mild to moderate disease in LAD on cath 12/2013) HTN, HLD, DM II.  Presented to ED 06/06/21 with complaints of CP, worsening dyspnea, orthopnea, PND and fatigue for about a week. She was admitted for acute systolic CHF. Lactic acid 2.1>1.6,  HS troponin 546>584, BNP 2,924, Scr 1.01, CO2 22, K 5.0, Na 137. ECG sinus tach 110, anterior and inferior Qs.  CTA chest negative for PE, small bilateral effusions, extensive coronary calcifications in LAD territory.  Has diuresed 8 lb with IV lasix. Beta blocker held d/t borderline low-output on exam. No BP room for GDMT.  Echo: EF 20-25%, RWMA in LAD territory, RV moderately reduced, moderate MR.  R/LHC today:  totally occluded p to m RCA which fills by collaterals from Cx and LAD, Cx okay, 95% p to m LAD, 95% D1, RA mean 14, PA 40/18 (28), PCWP mean 23, LVEDP 27, Fick CO/CI 3.84/2.22.   Reports worsening CP and dyspnea with exertion for about a week. Notes she was unable to walk to her mailbox without taking breaks. 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 Also with orthopnea, PND and LE edema. Has been under a lot of stress after her car broke down.   Dyspnea improved with diuresis but still having some orthopnea. Intermittent CP for which she has orders for SL nitroglycerin and maalox. Currently pain free.  She lives independently. Family reports she is sometimes forgetful. Reports taking medicines without difficulty but had trouble getting refills recently d/t transportation issues.  Staff reports forgetfulness.  Review of Systems: [y] = yes, [ ]  = no   General: Weight gain [ ] ; Weight loss [ ] ;  Anorexia [ ] ; Fatigue [Y]; Fever [ ] ; Chills [ ] ; Weakness [Y]  Cardiac: Chest pain/pressure [Y]; Resting SOB [ ] ; Exertional SOB [Y]; Orthopnea [Y]; Pedal Edema [Y]; Palpitations [ ] ; Syncope [ ] ; Presyncope [ ] ; Paroxysmal nocturnal dyspnea[ ]   Pulmonary: Cough [ ] ; Wheezing[ ] ; Hemoptysis[ ] ; Sputum [ ] ; Snoring [ ]   GI: Vomiting[ ] ; Dysphagia[ ] ; Melena[ ] ; Hematochezia [ ] ; Heartburn[ ] ; Abdominal pain [ ] ; Constipation [ ] ; Diarrhea [ ] ; BRBPR [ ]   GU: Hematuria[ ] ; Dysuria [ ] ; Nocturia[ ]   Vascular: Pain in legs with walking [ ] ; Pain in feet with lying flat [ ] ; Non-healing sores [ ] ; Stroke [ ] ; TIA [ ] ; Slurred speech [ ] ;  Neuro: Headaches[ ] ; Vertigo[ ] ; Seizures[ ] ; Paresthesias[ ] ;Blurred vision [ ] ; Diplopia [ ] ; Vision changes [ ]   Ortho/Skin: Arthritis [ ] ; Joint pain [ ] ; Muscle pain [ ] ; Joint swelling [ ] ; Back Pain [ ] ; Rash [ ]   Psych: Depression[ ] ; Anxiety[ ]   Heme: Bleeding problems [ ] ; Clotting disorders [ ] ; Anemia [ ]   Endocrine: Diabetes [Y]; Thyroid dysfunction[ ]   Home Medications Prior to Admission medications   Medication Sig Start Date End Date Taking? Authorizing Provider  ascorbic acid (VITAMIN C) 500 MG tablet Take 1 tablet by mouth daily.   Yes [provider]  aspirin EC 81 MG tablet Take 81 mg by mouth daily.     Yes [provider]  atorvastatin (LIPITOR) 20  MG tablet Take 20 mg by mouth at bedtime.   Yes [provider]  Cholecalciferol (VITAMIN D3) 5000 UNITS CAPS Take 2,000 Units by mouth daily.   Yes [provider]  clotrimazole-betamethasone (LOTRISONE) cream Apply 1 application. topically 2 (two) times daily. 04/25/21 04/25/22 Yes [provider]  estradiol (ESTRACE) 0.1 MG/GM vaginal cream Place vaginally. 03/22/21 03/22/22 Yes [provider]  metFORMIN (GLUCOPHAGE-XR) 500 MG 24 hr tablet Take 1 tablet (500 mg total) by mouth 2 (two) times daily. 12/30/13  Yes Jettie Booze, MD   Methenamine-Sodium Salicylate (AZO URINARY TRACT DEFENSE PO) Take 2 capsules by mouth daily.   Yes [provider]  Multiple Vitamins-Minerals (CENTRUM SILVER PO) Take 1 tablet by mouth daily.     Yes [provider]  vitamin E 200 UNIT capsule Take by mouth.   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 Donnel Saxon in 2004   CAD (coronary artery disease)    80% stenosis in moderate size diagonal on cath in 2004. with last adenosine Cardiolite study in 2007 showing no abnormalities   Depression    Diabetes mellitus    Dyslipidemia    GERD (gastroesophageal reflux disease)    Hyperlipidemia    Hypertension    Mitral valve prolapse    Osteoarthritis    Peripheral neuropathy    Renal insufficiency     Past Surgical History: Past Surgical History:  Procedure Laterality Date   CARDIAC CATHETERIZATION  01/21/2002   80% stenosis of moderate sized diagonal branch   CHOLECYSTECTOMY  1978   LEFT BREAST BIOPSY     By Dr. Anthony Sar showed fibrocystic changes   LEFT HEART CATHETERIZATION WITH CORONARY ANGIOGRAM N/A 12/28/2013   Procedure: LEFT HEART CATHETERIZATION WITH CORONARY ANGIOGRAM;  Surgeon: Jettie Booze, MD;  Location: [facility] CATH LAB;  Service: Cardiovascular;  Laterality: N/A;    Family History: Family History  Problem Relation Age of Onset   Cancer Father    Pneumonia Mother    Cancer Brother        3 brothers died from cancer    Social History: Social History   Socioeconomic History   Marital status: Divorced    Spouse name: Not on file   Number of children: Not on file   Years of education: Not on file   Highest education level: Not on file  Occupational History   Not on file  Tobacco Use   Smoking status: Former    Packs/day: 0.25    Years: 21.00    Pack years: 5.25    Types: Cigarettes    Start date: 06/22/1966    Quit  date: 07/23/1987    Years since quitting: 33.9   Smokeless tobacco: Never  Substance and Sexual Activity   Alcohol use: No    Alcohol/week: 0.0 standard drinks   Drug use: No   Sexual activity: Not on file  Other Topics Concern   Not on file  Social History Narrative   Has one son and one daughter.   Has five grandchildren.   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 3 brothers died with cancer.   Social Determinants of Health   Financial Resource Strain: Not on file  Food Insecurity: Not on file  Transportation Needs: Not on file  Physical Activity: Not on file  Stress: Not on  file  Social Connections: Not on file    Allergies:  Allergies  Allergen Reactions   Latex     Itching and rash    Objective:    Vital Signs:   Temp:  [97.7 F (36.5 C)-98 F (36.7 C)] 98 F (36.7 C) (06/02 1523) Pulse Rate:  [74-89] 75 (06/02 1540) Resp:  [11-22] 18 (06/02 1540) BP: (96-119)/(69-89) 108/75 (06/02 1540) SpO2:  [83 %-99 %] 90 % (06/02 1540) Weight:  [60.1 kg] 60.1 kg (06/02 0534) Last BM Date : 06/05/21  Weight change: Filed Weights   06/06/21 2238 06/07/21 0622 06/08/21 0534  Weight: 63.7 kg 62.5 kg 60.1 kg    Intake/Output:   Intake/Output Summary (Last 24 hours) at 06/08/2021 1601 Last data filed at 06/08/2021 1529 Gross per 24 hour  Intake 568.38 ml  Output 600 ml  Net -31.62 ml      Physical Exam    General:  No distress. Sitting up in bed. HEENT: normal Neck: supple. JVP to ear. Carotids 2+ bilat; no bruits.  Cor: PMI nondisplaced. Regular rate & rhythm. + S3. No rubs or murmurs. Lungs: clear Abdomen: soft, nontender, nondistended. No hepatosplenomegaly.  Extremities: no cyanosis, clubbing, rash, 1-2 + edema Neuro: alert & orientedx3, cranial nerves grossly intact. moves all 4 extremities w/o difficulty. Affect pleasant   Telemetry   SR 70s, 5 beat run NSVT  EKG    Sinus tach 110, inferior and anterior Qs  Labs    Basic Metabolic Panel: Recent Labs  Lab 06/06/21 1754 06/07/21 0258 06/08/21 0232 06/08/21 1436  NA 137 136 136 137  K 5.0 3.7 4.9 4.1  CL 105 103 100  --   CO2 22 22 24   --   GLUCOSE 201* 226* 138*  --   BUN 29* 28* 28*  --   CREATININE 1.01* 1.06* 1.17*  --   CALCIUM 9.2 9.0 8.8*  --   MG  --   --  1.9  --     Liver Function Tests: No results for input(s): AST, ALT, ALKPHOS, BILITOT, PROT, ALBUMIN in the last 168 hours. No results for input(s): LIPASE, AMYLASE in the last 168 hours. No results for input(s): AMMONIA in the last 168 hours.  CBC: Recent Labs  Lab 06/06/21 1754 06/07/21 0258 06/08/21 0232 06/08/21 1436  WBC 6.2 6.6 7.0  --   HGB 11.4* 10.9* 11.6* 11.6*  HCT 34.8* 32.0* 35.4* 34.0*  MCV 87.2 84.4 85.3  --   PLT 365 309 343  --     Cardiac Enzymes: No results for input(s): CKTOTAL, CKMB, CKMBINDEX, TROPONINI in the last 168 hours.  BNP: BNP (last 3 results) Recent Labs    06/06/21 1757  BNP 2,924.0*    ProBNP (last 3 results) No results for input(s): PROBNP in the last 8760 hours.   CBG: Recent Labs  Lab 06/07/21 1105 06/07/21 1605 06/07/21 2132 06/08/21 0639 06/08/21 1118  GLUCAP 171* 185* 128* 110* 113*    Coagulation Studies: Recent Labs    06/06/21 1754  LABPROT 14.4  INR 1.1     Imaging   CARDIAC CATHETERIZATION  Result Date: 06/08/2021 CONCLUSIONS: Ischemic cardiomyopathy with acute on chronic systolic heart failure, LVEDP 27 mmHg. CPO = 0.71. Widely patent left main Proximal to mid segmental calcified 95% LAD.  First diagonal of moderate size also 95% obstructed.  LAD and circumflex supply collaterals to the right coronary. Circumflex is widely patent with irregularities but no high-grade obstruction. Totally occluded proximal to mid  right coronary.  Fills by left to right collaterals. Mild pulmonary artery hypertension with mean PA pressure 28 mmHg.  Etiology likely WHO group 2 etiology.  Capillary wedge mean pressure  23 mmHg.  Pulmonary vascular resistance 1.3 Wood units Elevated right atrial pressure, mean pressure 14 mmHg. PAPI = 1.6 Cardiac output 3.84 L/min; cardiac index 2.22 L/min/m. CPO = 0.71 Recommendations: To right and left heart failure. Consider PCI on LAD with plaque modification by orbital atherectomy or shockwave if anterior wall viability. Advanced heart failure team consult.     Medications:     Current Medications:  [START ON 06/09/2021] aspirin  81 mg Oral Daily   atorvastatin  20 mg Oral QHS   insulin aspart  0-5 Units Subcutaneous QHS   insulin aspart  0-9 Units Subcutaneous TID WC   multivitamin with minerals  1 tablet Oral Daily   pantoprazole  40 mg Oral Daily   sodium chloride flush  3 mL Intravenous Q12H   sodium chloride flush  3 mL Intravenous Q12H    Infusions:  sodium chloride     sodium chloride 50 mL/hr at 06/08/21 1529   sodium chloride        Patient Profile   70 y.o. female with history of CAD (nonobstructive on cath 2015), DM II, HTN, HLD. Admitted with acute systolic CHF.  Assessment/Plan   Acute systolic CHF/iCM: -EF [id number] on stress MPI 01/2016 -Echo this admit: EF 20-25%, RWMA in LAD territory, RV moderately reduced, moderate MR. -R/LHC:  totally occluded p to m RCA which fills by collaterals from Cx and LAD, Cx okay, 95% p to m LAD, 95% D1, RA mean 14, PA 40/18 (28), PCWP mean 23, LVEDP 27, Fick CO/CI 3.84/2.22 -Likely ischemic cardiomyopathy. Plan cMRI on Monday to assess for viability. Consult TCTS for consideration of CABG. However, appears somewhat frail on exam and ? mild cognitive impairment. Otherwise considering PCI on LAD -Volume up on exam and by RHC -Good response to 80 mg lasix IV so far. Will give another 80 mg lasix IV X 2.  -Agree with stopping beta blocker -Add spiro 12.5 mg daily -Add digoxin 0.125 -BP likely too soft for Entresto -SGLT2i next  2. CAD: -Reports progressive exertional dyspnea and CP prior to admission -HS  troponin 546>584 -Q waves inferior and anterior leads on ECG -? Out of hospital MI -LHC today with occluded RCA, 95% LAD and 95% D1.  -cMRI next week to assess viability and considering CABG vs PCI as above.  -Aspirin 81 mg + atorvastatin 80  3. DM II: -A1c 7.6 -Taking metformin prior to admit -Consider SGLT2i prior to discharge  4. HTN: -BP soft this admit -Meds as above  5. HLD:  -Atorvastatin 80  Family reports she has Medicare but no Rx drug coverage. Seen by TOC CM/CSW. Signing up for Part D next enrollment period.  Length of Stay: 2  FINCH, LINDSAY N, PA-C  06/08/2021, 4:01 PM  Advanced Heart Failure Team Pager [phone] (M-F; 7a - 5p)  Please contact [facility] for night-coverage after hours (4p -7a ) and weekends on [email or web address]   Patient seen with PA, agree with the above note.   Patient works as a Programmer, applications full time.  She seems quite functional at baseline but may have some memory issues (daughter said that this is "not bad").  She has type 2 diabetes and HTN, had AV nodal ablation in the past.    Patient reports that 1-2 weeks prior to admission,  she developed episodes of exertional chest pressure as well as shortness of breath.  She was admitted with CHF-type symptoms.  ECG showed inferior and anterior Qs.  Echo showed EF 20-25% with moderate RV dysfunction and moderate MR, dilated IVC.  HS-TnI was mildly elevated with no trend (546 => 585).   Cath was done today showing severe proximal to mid LAD and proximal D1 disease as well as occluded RCA with collaterals.  RHC showed elevated filling pressures and relatively preserved CI at 2.22.   General: NAD Neck: JVP 14+ cm, no thyromegaly or thyroid nodule.  Lungs: Clear to auscultation bilaterally with normal respiratory effort. CV: Nondisplaced PMI.  Heart regular S1/S2, no S3/S4, no murmur.  1+ edema to knees.  Abdomen: Soft, nontender, no hepatosplenomegaly, no distention.  Skin: Intact without lesions or  rashes.  Neurologic: Alert and oriented x 3.  Psych: Normal affect. Extremities: No clubbing or cyanosis.  HEENT: Normal.   1. Acute on chronic systolic CHF: Ischemic cardiomyopathy.  Echo this admission with EF 20-25% with moderate RV dysfunction and moderate MR, dilated IVC.  With inferior and anterior Qs on ECG, she may have significant non-viable territory in her LV.  She is volume overloaded on exam.  RHC showed elevated filling pressures with CI 2.22.  - Lasix 80 mg IV bid.  - digoxin 0.125 daily.  - spironolactone 12.5 daily.  - Will need to decide on revascularization (see below).  Will order cardiac MRI for viability.  2. CAD: Admitted with CHF but had anginal-type symptoms as well.  HS-TnI at admission did not suggest ACS at the time of admission.  Inferior and anterior Qs suggested out of hospital MI.  Cath today showed severe proximal to mid LAD and proximal D1 disease as well as occluded RCA with collaterals.  She has potential CABG targets.  She is a diabetic with systolic CHF.  She is active at baseline (Works as Programmer, applications).  There is some question about her memory.   - Continue ASA  - atorvastatin 80 mg daily.  - I will arrange for cMRI for viability evaluation.  If she has significant viability in RCA and LAD territories, will need to consider whether she is a CABG candidate.  I do have some questions about her memory, and low EF puts her at higher risk.  She is a diabetic.  Will check prealbumin.  PCI to the LAD would also be an option if we decide no CABG.  3. Type 2 diabetes: Will start [place] soon.   Loralie Champagne 06/08/2021 5:42 PM

## 2021-06-08 NOTE — Progress Notes (Addendum)
Per PACU, TR Band was placed at 1454 with 11 cc---remvd 3 cc at 1645, no bleeding, no hematomo--remvd 3cc at 1735, remvd  3cc 1815, remvd 2cc 1845--no bleeding, no hematoma

## 2021-06-09 DIAGNOSIS — I5023 Acute on chronic systolic (congestive) heart failure: Secondary | ICD-10-CM | POA: Diagnosis not present

## 2021-06-09 LAB — CBC
HCT: 32.8 % — ABNORMAL LOW (ref 36.0–46.0)
Hemoglobin: 10.9 g/dL — ABNORMAL LOW (ref 12.0–15.0)
MCH: 28.7 pg (ref 26.0–34.0)
MCHC: 33.2 g/dL (ref 30.0–36.0)
MCV: 86.3 fL (ref 80.0–100.0)
Platelets: 335 10*3/uL (ref 150–400)
RBC: 3.8 MIL/uL — ABNORMAL LOW (ref 3.87–5.11)
RDW: 14.6 % (ref 11.5–15.5)
WBC: 6.1 10*3/uL (ref 4.0–10.5)
nRBC: 0 % (ref 0.0–0.2)

## 2021-06-09 LAB — COMPREHENSIVE METABOLIC PANEL
ALT: 83 U/L — ABNORMAL HIGH (ref 0–44)
AST: 49 U/L — ABNORMAL HIGH (ref 15–41)
Albumin: 3 g/dL — ABNORMAL LOW (ref 3.5–5.0)
Alkaline Phosphatase: 72 U/L (ref 38–126)
Anion gap: 10 (ref 5–15)
BUN: 24 mg/dL — ABNORMAL HIGH (ref 8–23)
CO2: 26 mmol/L (ref 22–32)
Calcium: 8.5 mg/dL — ABNORMAL LOW (ref 8.9–10.3)
Chloride: 100 mmol/L (ref 98–111)
Creatinine, Ser: 0.9 mg/dL (ref 0.44–1.00)
GFR, Estimated: >60 mL/min (ref >60)
Glucose, Bld: 157 mg/dL — ABNORMAL HIGH (ref 70–99)
Potassium: 4 mmol/L (ref 3.5–5.1)
Sodium: 136 mmol/L (ref 135–145)
Total Bilirubin: 0.8 mg/dL (ref 0.3–1.2)
Total Protein: 5.8 g/dL — ABNORMAL LOW (ref 6.5–8.1)

## 2021-06-09 LAB — GLUCOSE, CAPILLARY
Glucose-Capillary: 156 mg/dL — ABNORMAL HIGH (ref 70–99)
Glucose-Capillary: 269 mg/dL — ABNORMAL HIGH (ref 70–99)
Glucose-Capillary: 479 mg/dL — ABNORMAL HIGH (ref 70–99)
Glucose-Capillary: 119 mg/dL — ABNORMAL HIGH (ref 70–99)
Glucose-Capillary: 250 mg/dL — ABNORMAL HIGH (ref 70–99)

## 2021-06-09 LAB — HEPARIN LEVEL (UNFRACTIONATED): Heparin Unfractionated: 0.61 IU/mL (ref 0.30–0.70)

## 2021-06-09 LAB — PREALBUMIN: Prealbumin: 19.1 mg/dL (ref 18–38)

## 2021-06-09 MED ORDER — FUROSEMIDE 10 MG/ML IJ SOLN
80.0000 mg | Freq: Two times a day (BID) | INTRAMUSCULAR | Status: DC
  Administered 2021-06-09 – 2021-06-10 (×4): 80 mg via INTRAVENOUS
  Filled 2021-06-09 (×3): qty 8

## 2021-06-09 MED ORDER — ENOXAPARIN SODIUM 40 MG/0.4ML IJ SOSY
40.0000 mg | PREFILLED_SYRINGE | INTRAMUSCULAR | Status: DC
  Administered 2021-06-09 – 2021-06-15 (×6): 40 mg via SUBCUTANEOUS
  Filled 2021-06-09 (×6): qty 0.4

## 2021-06-09 MED ORDER — POTASSIUM CHLORIDE CRYS ER 20 MEQ PO TBCR
40.0000 meq | EXTENDED_RELEASE_TABLET | Freq: Once | ORAL | Status: AC
Start: 2021-06-09 — End: 2021-06-09
  Administered 2021-06-09: 40 meq via ORAL
  Filled 2021-06-09: qty 2

## 2021-06-09 MED ORDER — SPIRONOLACTONE 25 MG PO TABS
25.0000 mg | ORAL_TABLET | Freq: Every day | ORAL | Status: DC
  Administered 2021-06-09 – 2021-06-15 (×7): 25 mg via ORAL
  Filled 2021-06-09 (×7): qty 1

## 2021-06-09 MED ORDER — DAPAGLIFLOZIN PROPANEDIOL 10 MG PO TABS
10.0000 mg | ORAL_TABLET | Freq: Every day | ORAL | Status: DC
  Administered 2021-06-09 – 2021-06-15 (×7): 10 mg via ORAL
  Filled 2021-06-09 (×7): qty 1

## 2021-06-09 NOTE — Progress Notes (Signed)
Patient has been nauseated today, and has not agreed to walk due to feeling bad

## 2021-06-09 NOTE — Progress Notes (Signed)
ANTICOAGULATION CONSULT NOTE  Pharmacy Consult for Heparin Indication: chest pain/ACS  Allergies  Allergen Reactions   Latex     Itching and rash    Patient Measurements: Height: 5\' 7"  (170.2 cm) Weight: 59.3 kg (130 lb 11.7 oz) IBW/kg (Calculated) : 61.6  Heparin Dosing Weight: 64 kg  Vital Signs: Temp: 98.6 F (37 C) (06/03 0800) Temp Source: Oral (06/03 0800) BP: 102/72 (06/03 0800) Pulse Rate: 81 (06/03 0800)  Labs: Recent Labs    06/06/21 1754 06/06/21 1757 06/06/21 1943 06/07/21 0258 06/07/21 1250 06/08/21 0232 06/08/21 1243 06/08/21 1436 06/09/21 0544  HGB 11.4*  --   --  10.9*  --  11.6*  --  11.6* 10.9*  HCT 34.8*  --   --  32.0*  --  35.4*  --  34.0* 32.8*  PLT 365  --   --  309  --  343  --   --  335  APTT  --   --   --  86*  --   --   --   --   --   LABPROT 14.4  --   --   --   --   --   --   --   --   INR 1.1  --   --   --   --   --   --   --   --   HEPARINUNFRC  --   --   --  0.63   < > 0.43 0.46  --  0.61  CREATININE 1.01*  --   --  1.06*  --  1.17*  --   --  0.90  TROPONINIHS  --  546* 584*  --   --   --   --   --   --    < > = values in this interval not displayed.     Estimated Creatinine Clearance: 52.9 mL/min (by C-G formula based on SCr of 0.9 mg/dL).    Assessment: 71 yo female who presented with shortness of breath, leg swelling, and chest pain on heparin for ACS/CP. No anticoagulation PTA.   Now s/p cath 6/2 showing ischemic cardiomyopathy with acute on chronic systolic HF, considering PCI on LAD vs CABG -heparin level at goal on 700 units/hr  Goal of Therapy:  Heparin level 0.3-0.7 units/ml Monitor platelets by anticoagulation protocol: Yes   Plan:  Continue heparin 700 units/hr  Daily heparin level and CBC  8/2, PharmD Clinical Pharmacist **Pharmacist phone directory can now be found on [email or web address] ([email or web address]).  Listed under [facility].

## 2021-06-09 NOTE — Progress Notes (Addendum)
Patient ID: Shelby Trevino, female   DOB: [birth year], 71 y.o.   MRN: [id number]     Advanced Heart Failure Rounding Note  PCP-Cardiologist: None   Subjective:    Breathing better, no chest pain.  Legs still feel tight.   She diuresed overnight with Lasix, weight down.    Objective:   Weight Range: 59.3 kg Body mass index is 20.48 kg/m.   Vital Signs:   Temp:  [97.8 F (36.6 C)-98.6 F (37 C)] 98.6 F (37 C) (06/03 0800) Pulse Rate:  [75-84] 81 (06/03 0800) Resp:  [11-23] 19 (06/03 0800) BP: (96-119)/(68-93) 102/72 (06/03 0800) SpO2:  [83 %-100 %] 93 % (06/03 0800) Weight:  [59.3 kg] 59.3 kg (06/03 0447) Last BM Date : 06/05/21  Weight change: Filed Weights   06/07/21 0622 06/08/21 0534 06/09/21 0447  Weight: 62.5 kg 60.1 kg 59.3 kg    Intake/Output:   Intake/Output Summary (Last 24 hours) at 06/09/2021 0950 Last data filed at 06/09/2021 0800 Gross per 24 hour  Intake 722.71 ml  Output 1550 ml  Net -827.29 ml      Physical Exam    General:  Well appearing. No resp difficulty HEENT: Normal Neck: Supple. JVP 14 cm. Carotids 2+ bilat; no bruits. No lymphadenopathy or thyromegaly appreciated. Cor: PMI nondisplaced. Regular rate & rhythm. No rubs, gallops or murmurs. Lungs: Clear Abdomen: Soft, nontender, nondistended. No hepatosplenomegaly. No bruits or masses. Good bowel sounds. Extremities: No cyanosis, clubbing, rash. 1+ edema 1/2 to knees.  Neuro: Alert & orientedx3, cranial nerves grossly intact. moves all 4 extremities w/o difficulty. Affect pleasant   Telemetry   NSR 70s (personally reviewed)  Labs    CBC Recent Labs    06/08/21 0232 06/08/21 1436 06/09/21 0544  WBC 7.0  --  6.1  HGB 11.6* 11.6* 10.9*  HCT 35.4* 34.0* 32.8*  MCV 85.3  --  86.3  PLT 343  --  123456   Basic Metabolic Panel Recent Labs    06/08/21 0232 06/08/21 1436 06/09/21 0544  NA 136 137 136  K 4.9 4.1 4.0  CL 100  --  100  CO2 24  --  26  GLUCOSE 138*  --  157*   BUN 28*  --  24*  CREATININE 1.17*  --  0.90  CALCIUM 8.8*  --  8.5*  MG 1.9  --   --    Liver Function Tests Recent Labs    06/09/21 0544  AST 49*  ALT 83*  ALKPHOS 72  BILITOT 0.8  PROT 5.8*  ALBUMIN 3.0*   No results for input(s): LIPASE, AMYLASE in the last 72 hours. Cardiac Enzymes No results for input(s): CKTOTAL, CKMB, CKMBINDEX, TROPONINI in the last 72 hours.  BNP: BNP (last 3 results) Recent Labs    06/06/21 1757  BNP 2,924.0*    ProBNP (last 3 results) No results for input(s): PROBNP in the last 8760 hours.   D-Dimer No results for input(s): DDIMER in the last 72 hours. Hemoglobin A1C Recent Labs    06/08/21 0232  HGBA1C 7.6*   Fasting Lipid Panel No results for input(s): CHOL, HDL, LDLCALC, TRIG, CHOLHDL, LDLDIRECT in the last 72 hours. Thyroid Function Tests Recent Labs    06/07/21 0258  TSH 1.572    Other results:   Imaging    CARDIAC CATHETERIZATION  Result Date: 06/08/2021 CONCLUSIONS: Ischemic cardiomyopathy with acute on chronic systolic heart failure, LVEDP 27 mmHg. CPO = 0.71. Widely patent left main Proximal to mid segmental  calcified 95% LAD.  First diagonal of moderate size also 95% obstructed.  LAD and circumflex supply collaterals to the right coronary. Circumflex is widely patent with irregularities but no high-grade obstruction. Totally occluded proximal to mid right coronary.  Fills by left to right collaterals. Mild pulmonary artery hypertension with mean PA pressure 28 mmHg.  Etiology likely WHO group 2 etiology.  Capillary wedge mean pressure 23 mmHg.  Pulmonary vascular resistance 1.3 Wood units Elevated right atrial pressure, mean pressure 14 mmHg. PAPI = 1.6 Cardiac output 3.84 L/min; cardiac index 2.22 L/min/m. CPO = 0.71 Recommendations: To right and left heart failure. Consider PCI on LAD with plaque modification by orbital atherectomy or shockwave if anterior wall viability. Advanced heart failure team consult.      Medications:     Scheduled Medications:  aspirin  81 mg Oral Daily   atorvastatin  80 mg Oral QHS   dapagliflozin propanediol  10 mg Oral Daily   digoxin  0.125 mg Oral Daily   furosemide  80 mg Intravenous BID   insulin aspart  0-5 Units Subcutaneous QHS   insulin aspart  0-9 Units Subcutaneous TID WC   multivitamin with minerals  1 tablet Oral Daily   pantoprazole  40 mg Oral Daily   potassium chloride  40 mEq Oral Once   sodium chloride flush  3 mL Intravenous Q12H   sodium chloride flush  3 mL Intravenous Q12H   spironolactone  25 mg Oral Daily    Infusions:  sodium chloride     sodium chloride     heparin 700 Units/hr (06/08/21 2237)    PRN Medications: sodium chloride, sodium chloride, acetaminophen, alum & mag hydroxide-simeth, nitroGLYCERIN, ondansetron (ZOFRAN) IV, sodium chloride flush, sodium chloride flush   Assessment/Plan   1. Acute on chronic systolic CHF: Ischemic cardiomyopathy.  Echo this admission with EF 20-25% with moderate RV dysfunction and moderate MR, dilated IVC.  With inferior and anterior Qs on ECG, she may have significant non-viable territory in her LV.  She is volume overloaded on exam still.  RHC on 6/2 showed elevated filling pressures with CI 2.22.  - Lasix 80 mg IV bid today.  - digoxin 0.125 daily.  - Increase spironolactone to 25 daily.  - Add Farxiga 10 daily.  - Will need to decide on revascularization (see below).  Have ordered cardiac MRI for viability.  2. CAD: Admitted with CHF but had anginal-type symptoms as well.  HS-TnI at admission did not suggest ACS at the time of admission.  Inferior and anterior Qs suggested out of hospital MI.  Cath on 6/2 showed severe proximal to mid LAD and proximal D1 disease as well as occluded RCA with collaterals.  She has potential CABG targets.  She is a diabetic with systolic CHF.  She is active at baseline (Works as Programmer, applications).  There is some question about her memory.   - Continue ASA   - atorvastatin 80 mg daily.  - Stop IV heparin, start Lovenox prophylaxis.  - I will arrange for cMRI for viability evaluation (will not be done until Monday).  If she has significant viability in RCA and LAD territories, will need to consider whether she is a CABG candidate => TCTS consulted.  I do have some questions about her memory, and low EF puts her at higher risk.  She is a diabetic.  Prealbumin is normal.  PCI to the LAD would also be an option if we decide no CABG.  3. Type  2 diabetes: Added [place] today.   Mobilize.    Length of Stay: 3  Loralie Champagne, MD  06/09/2021, 9:50 AM  Advanced Heart Failure Team Pager [phone] (M-F; 7a - 5p)  Please contact [facility] for night-coverage after hours (5p -7a ) and weekends on [email or web address]

## 2021-06-09 NOTE — Plan of Care (Signed)
  Problem: Education: Goal: Knowledge of General Education information will improve Description: Including pain rating scale, medication(s)/side effects and non-pharmacologic comfort measures Outcome: Progressing   Problem: Health Behavior/Discharge Planning: Goal: Ability to manage health-related needs will improve Outcome: Progressing   Problem: Clinical Measurements: Goal: Ability to maintain clinical measurements within normal limits will improve Outcome: Progressing Goal: Will remain free from infection Outcome: Progressing Goal: Diagnostic test results will improve Outcome: Progressing Goal: Respiratory complications will improve Outcome: Progressing Goal: Cardiovascular complication will be avoided Outcome: Progressing   Problem: Activity: Goal: Risk for activity intolerance will decrease Outcome: Progressing   Problem: Nutrition: Goal: Adequate nutrition will be maintained Outcome: Progressing   Problem: Coping: Goal: Level of anxiety will decrease Outcome: Progressing   Problem: Elimination: Goal: Will not experience complications related to bowel motility Outcome: Progressing Goal: Will not experience complications related to urinary retention Outcome: Progressing   Problem: Pain Managment: Goal: General experience of comfort will improve Outcome: Progressing   Problem: Safety: Goal: Ability to remain free from injury will improve Outcome: Progressing   Problem: Skin Integrity: Goal: Risk for impaired skin integrity will decrease Outcome: Progressing   Problem: Education: Goal: Ability to describe self-care measures that may prevent or decrease complications (Diabetes Survival Skills Education) will improve Outcome: Progressing Goal: Individualized Educational Video(s) Outcome: Progressing   Problem: Coping: Goal: Ability to adjust to condition or change in health will improve Outcome: Progressing   Problem: Fluid Volume: Goal: Ability to  maintain a balanced intake and output will improve Outcome: Progressing   Problem: Health Behavior/Discharge Planning: Goal: Ability to identify and utilize available resources and services will improve Outcome: Progressing Goal: Ability to manage health-related needs will improve Outcome: Progressing   Problem: Metabolic: Goal: Ability to maintain appropriate glucose levels will improve Outcome: Progressing   Problem: Nutritional: Goal: Maintenance of adequate nutrition will improve Outcome: Progressing Goal: Progress toward achieving an optimal weight will improve Outcome: Progressing   Problem: Skin Integrity: Goal: Risk for impaired skin integrity will decrease Outcome: Progressing   Problem: Tissue Perfusion: Goal: Adequacy of tissue perfusion will improve Outcome: Progressing   Problem: Activity: Goal: Capacity to carry out activities will improve Outcome: Progressing   Problem: Education: Goal: Ability to demonstrate management of disease process will improve Outcome: Progressing Goal: Ability to verbalize understanding of medication therapies will improve Outcome: Progressing Goal: Individualized Educational Video(s) Outcome: Progressing   Problem: Cardiac: Goal: Ability to achieve and maintain adequate cardiopulmonary perfusion will improve Outcome: Progressing   Problem: Education: Goal: Understanding of CV disease, CV risk reduction, and recovery process will improve Outcome: Progressing Goal: Individualized Educational Video(s) Outcome: Progressing   Problem: Activity: Goal: Ability to return to baseline activity level will improve Outcome: Progressing   Problem: Cardiovascular: Goal: Ability to achieve and maintain adequate cardiovascular perfusion will improve Outcome: Progressing Goal: Vascular access site(s) Level 0-1 will be maintained Outcome: Progressing   Problem: Health Behavior/Discharge Planning: Goal: Ability to safely manage  health-related needs after discharge will improve Outcome: Progressing

## 2021-06-10 ENCOUNTER — Other Ambulatory Visit: Payer: Self-pay

## 2021-06-10 DIAGNOSIS — I5023 Acute on chronic systolic (congestive) heart failure: Secondary | ICD-10-CM | POA: Diagnosis not present

## 2021-06-10 DIAGNOSIS — I2510 Atherosclerotic heart disease of native coronary artery without angina pectoris: Secondary | ICD-10-CM | POA: Diagnosis not present

## 2021-06-10 DIAGNOSIS — I509 Heart failure, unspecified: Secondary | ICD-10-CM | POA: Diagnosis not present

## 2021-06-10 DIAGNOSIS — I249 Acute ischemic heart disease, unspecified: Secondary | ICD-10-CM | POA: Diagnosis not present

## 2021-06-10 LAB — CBC
HCT: 35.7 % — ABNORMAL LOW (ref 36.0–46.0)
Hemoglobin: 12.1 g/dL (ref 12.0–15.0)
MCH: 28.8 pg (ref 26.0–34.0)
MCHC: 33.9 g/dL (ref 30.0–36.0)
MCV: 85 fL (ref 80.0–100.0)
Platelets: 346 10*3/uL (ref 150–400)
RBC: 4.2 MIL/uL (ref 3.87–5.11)
RDW: 14.5 % (ref 11.5–15.5)
WBC: 8.5 10*3/uL (ref 4.0–10.5)
nRBC: 0 % (ref 0.0–0.2)

## 2021-06-10 LAB — BASIC METABOLIC PANEL
Anion gap: 9 (ref 5–15)
Anion gap: 14 (ref 5–15)
BUN: 19 mg/dL (ref 8–23)
BUN: 19 mg/dL (ref 8–23)
CO2: 29 mmol/L (ref 22–32)
CO2: 28 mmol/L (ref 22–32)
Calcium: 8.5 mg/dL — ABNORMAL LOW (ref 8.9–10.3)
Calcium: 9.3 mg/dL (ref 8.9–10.3)
Chloride: 99 mmol/L (ref 98–111)
Chloride: 94 mmol/L — ABNORMAL LOW (ref 98–111)
Creatinine, Ser: 0.95 mg/dL (ref 0.44–1.00)
Creatinine, Ser: 1.08 mg/dL — ABNORMAL HIGH (ref 0.44–1.00)
GFR, Estimated: >60 mL/min (ref >60)
GFR, Estimated: 55 mL/min — ABNORMAL LOW (ref >60)
Glucose, Bld: 131 mg/dL — ABNORMAL HIGH (ref 70–99)
Glucose, Bld: 178 mg/dL — ABNORMAL HIGH (ref 70–99)
Potassium: 4.4 mmol/L (ref 3.5–5.1)
Potassium: 4.3 mmol/L (ref 3.5–5.1)
Sodium: 137 mmol/L (ref 135–145)
Sodium: 136 mmol/L (ref 135–145)

## 2021-06-10 LAB — LACTIC ACID, PLASMA: Lactic Acid, Venous: 2 mmol/L (ref 0.5–1.9)

## 2021-06-10 LAB — MAGNESIUM: Magnesium: 2.5 mg/dL — ABNORMAL HIGH (ref 1.7–2.4)

## 2021-06-10 LAB — GLUCOSE, CAPILLARY
Glucose-Capillary: 144 mg/dL — ABNORMAL HIGH (ref 70–99)
Glucose-Capillary: 172 mg/dL — ABNORMAL HIGH (ref 70–99)
Glucose-Capillary: 154 mg/dL — ABNORMAL HIGH (ref 70–99)
Glucose-Capillary: 190 mg/dL — ABNORMAL HIGH (ref 70–99)

## 2021-06-10 MED ORDER — ADENOSINE 6 MG/2ML IV SOLN
INTRAVENOUS | Status: AC
  Administered 2021-06-10: 6 mg
  Filled 2021-06-10: qty 4

## 2021-06-10 MED ORDER — MAGNESIUM HYDROXIDE 400 MG/5ML PO SUSP
30.0000 mL | Freq: Every day | ORAL | Status: DC | PRN
  Administered 2021-06-14: 30 mL via ORAL
  Filled 2021-06-10: qty 30

## 2021-06-10 NOTE — Progress Notes (Signed)
Patient ID: Shelby Trevino, female   DOB: 1950/10/06, 71 y.o.   MRN: [id number]     Advanced Heart Failure Rounding Note  PCP-Cardiologist: None   Subjective:    Breathing better, no chest pain.    She diuresed overnight with Lasix, weight down again. SBP generally 100s.  No BMET yet.    Objective:   Weight Range: 55.8 kg Body mass index is 19.27 kg/m.   Vital Signs:   Temp:  [97.8 F (36.6 C)-98.8 F (37.1 C)] 98.5 F (36.9 C) (06/04 0800) Pulse Rate:  [78-90] 80 (06/04 0800) Resp:  [15-21] 17 (06/04 0800) BP: (96-117)/(66-85) 100/67 (06/04 0800) SpO2:  [93 %-99 %] 95 % (06/04 0800) Weight:  [55.8 kg] 55.8 kg (06/04 0414) Last BM Date : 06/05/21  Weight change: Filed Weights   06/08/21 0534 06/09/21 0447 06/10/21 0414  Weight: 60.1 kg 59.3 kg 55.8 kg    Intake/Output:   Intake/Output Summary (Last 24 hours) at 06/10/2021 0833 Last data filed at 06/10/2021 0800 Gross per 24 hour  Intake 600 ml  Output 2550 ml  Net -1950 ml      Physical Exam    General: NAD Neck: JVP 10-12 cm, no thyromegaly or thyroid nodule.  Lungs: Clear to auscultation bilaterally with normal respiratory effort. CV: Nondisplaced PMI.  Heart regular S1/S2, no S3/S4, no murmur.  1+ edema 1/2 to knees bilaterally.  Abdomen: Soft, nontender, no hepatosplenomegaly, no distention.  Skin: Intact without lesions or rashes.  Neurologic: Alert and oriented x 3.  Psych: Normal affect. Extremities: No clubbing or cyanosis.  HEENT: Normal.   Telemetry   NSR 70s (personally reviewed)  Labs    CBC Recent Labs    06/09/21 0544 06/10/21 0055  WBC 6.1 8.5  HGB 10.9* 12.1  HCT 32.8* 35.7*  MCV 86.3 85.0  PLT 335 346   Basic Metabolic Panel Recent Labs    58/52/77 0232 06/08/21 1436 06/09/21 0544  NA 136 137 136  K 4.9 4.1 4.0  CL 100  --  100  CO2 24  --  26  GLUCOSE 138*  --  157*  BUN 28*  --  24*  CREATININE 1.17*  --  0.90  CALCIUM 8.8*  --  8.5*  MG 1.9  --   --     Liver Function Tests Recent Labs    06/09/21 0544  AST 49*  ALT 83*  ALKPHOS 72  BILITOT 0.8  PROT 5.8*  ALBUMIN 3.0*   No results for input(s): LIPASE, AMYLASE in the last 72 hours. Cardiac Enzymes No results for input(s): CKTOTAL, CKMB, CKMBINDEX, TROPONINI in the last 72 hours.  BNP: BNP (last 3 results) Recent Labs    06/06/21 1757  BNP 2,924.0*    ProBNP (last 3 results) No results for input(s): PROBNP in the last 8760 hours.   D-Dimer No results for input(s): DDIMER in the last 72 hours. Hemoglobin A1C Recent Labs    06/08/21 0232  HGBA1C 7.6*   Fasting Lipid Panel No results for input(s): CHOL, HDL, LDLCALC, TRIG, CHOLHDL, LDLDIRECT in the last 72 hours. Thyroid Function Tests No results for input(s): TSH, T4TOTAL, T3FREE, THYROIDAB in the last 72 hours.  Invalid input(s): FREET3   Other results:   Imaging    No results found.   Medications:     Scheduled Medications:  aspirin  81 mg Oral Daily   atorvastatin  80 mg Oral QHS   dapagliflozin propanediol  10 mg Oral Daily  digoxin  0.125 mg Oral Daily   enoxaparin (LOVENOX) injection  40 mg Subcutaneous Q24H   furosemide  80 mg Intravenous BID   insulin aspart  0-5 Units Subcutaneous QHS   insulin aspart  0-9 Units Subcutaneous TID WC   multivitamin with minerals  1 tablet Oral Daily   pantoprazole  40 mg Oral Daily   sodium chloride flush  3 mL Intravenous Q12H   sodium chloride flush  3 mL Intravenous Q12H   spironolactone  25 mg Oral Daily    Infusions:  sodium chloride     sodium chloride      PRN Medications: sodium chloride, sodium chloride, acetaminophen, alum & mag hydroxide-simeth, magnesium hydroxide, nitroGLYCERIN, ondansetron (ZOFRAN) IV, sodium chloride flush, sodium chloride flush   Assessment/Plan   1. Acute on chronic systolic CHF: Ischemic cardiomyopathy.  Echo this admission with EF 20-25% with moderate RV dysfunction and moderate MR, dilated IVC.  With  inferior and anterior Qs on ECG, she may have significant non-viable territory in her LV.  RHC on 6/2 showed elevated filling pressures with CI 2.22. She diuresed well yesterday but still looks like she has some volume overload by exam.  - Lasix 80 mg IV bid again today as long as creatinine stable => needs stat BMET.  - digoxin 0.125 daily.  - Continue spironolactone 25 daily.  - Continue Farxiga 10 daily.  - With SBP around 100, will not add additional meds today.  - Will need to decide on revascularization (see below).  Have ordered cardiac MRI for viability.  2. CAD: Admitted with CHF but had anginal-type symptoms as well.  HS-TnI at admission did not suggest ACS at the time of admission.  Inferior and anterior Qs suggested out of hospital MI.  Cath on 6/2 showed severe proximal to mid LAD and proximal D1 disease as well as occluded RCA with collaterals.  She has potential CABG targets.  She is a diabetic with systolic CHF.  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  There is some question about her memory.   - Continue ASA  - atorvastatin 80 mg daily.  - I will arrange for cMRI for viability evaluation (will not be done until Monday).  If she has significant viability in RCA and LAD territories, will need to consider whether she is a CABG candidate => Dr. Laneta Simmers has seen.  I do have some questions about her memory, and low EF puts her at higher risk.  She is a diabetic.  Prealbumin is normal.  PCI to the LAD would also be an option if we decide no CABG.  3. Type 2 diabetes: Added [place].   Mobilize.    Length of Stay: 4  Marca Ancona, MD  06/10/2021, 8:33 AM  Advanced Heart Failure Team Pager [phone] (M-F; 7a - 5p)  Please contact [facility] for night-coverage after hours (5p -7a ) and weekends on [email or web address]

## 2021-06-10 NOTE — Consult Note (Signed)
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 Loralie Champagne  Shelby Trevino is an 71 y.o. female.  HPI:   The patient is a 71 year old woman with history of diabetes, dyslipidemia, hypertension, AVNRT status post ablation, and mild to moderate coronary disease by catheterization in 12/2013.  She presented to the emergency department on 06/06/2021 with complaints of right-sided chest discomfort, shortness of breath, fatigue, and orthopnea for about 1 week.  HS troponin was 546>584, BNP 2924.  ECG showed sinus tachycardia with anterior and inferior Q waves.  She had a CTA of the chest which was negative for pulmonary embolism but showed small bilateral pleural effusions and extensive coronary calcification in the LAD.  A 2D echo showed an ejection fraction of 20 to 25% with akinesis of the mid to apical anterior lateral, entire anterior septum, apex, and basal anterior walls.  There was hypokinesis in the remaining myocardial segments.  There is grade 3 diastolic dysfunction.  Right ventricular size is mildly enlarged with moderate systolic dysfunction.  There is moderate mitral regurgitation.  Cardiac catheterization on 06/08/2021 showed a widely patent left main.  There is 95% calcified proximal to mid LAD stenosis.  There is 95% small diagonal stenosis.  Left circumflex is widely patent with irregularity but no significant stenosis.  The right coronary artery is totally occluded in the proximal portion with filling of the distal vessel by left-to-right collaterals from the LAD.  PA pressure was 40/18 with a mean of 28.  Cardiac index was 2.2.  Mean right atrial pressure was 14.  She diuresed 8 pounds with IV Lasix and has had marked improvement in her shortness of breath.  She has had no chest  discomfort since admission.  She reports worsening chest discomfort and shortness of breath with exertion for about a week.  She works as an Data processing manager.  She reports being under a lot of stress since her car broke down prior to all this starting.  She said she was feeling well before that.  She lives alone and supports herself.  She has a son and a daughter who she said does not help her out much.  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 Donnel Saxon in 2004   CAD (coronary artery disease)    80% stenosis in moderate size diagonal on cath in 2004. with last adenosine Cardiolite study in 2007 showing no abnormalities   Depression    Diabetes mellitus    Dyslipidemia    GERD (gastroesophageal reflux disease)    Hyperlipidemia    Hypertension    Mitral valve prolapse    Osteoarthritis    Peripheral neuropathy    Renal insufficiency     Past Surgical History:  Procedure Laterality Date   CARDIAC CATHETERIZATION  01/21/2002   80% stenosis of moderate sized diagonal branch   CHOLECYSTECTOMY  1978   LEFT BREAST BIOPSY     By Dr. Anthony Sar showed fibrocystic changes   LEFT HEART CATHETERIZATION WITH CORONARY ANGIOGRAM N/A 12/28/2013   Procedure: LEFT HEART CATHETERIZATION WITH CORONARY ANGIOGRAM;  Surgeon: Jettie Booze, MD;  Location: [facility] CATH LAB;  Service: Cardiovascular;  Laterality: N/A;  Family History  Problem Relation Age of Onset   Cancer Father    Pneumonia Mother    Cancer Brother        3 brothers died from cancer    Social History:  reports that she quit smoking about 33 years ago. Her smoking use included cigarettes. She started smoking about 55 years ago. She has a 5.25 pack-year smoking history. She has never used smokeless tobacco. She reports that she does not drink alcohol and does not use drugs.  Allergies:  Allergies  Allergen Reactions   Latex     Itching and rash    Medications: I  have reviewed the patient's current medications. Prior to Admission:  Medications Prior to Admission  Medication Sig Dispense Refill Last Dose   ascorbic acid (VITAMIN C) 500 MG tablet Take 1 tablet by mouth daily.   06/06/2021   aspirin EC 81 MG tablet Take 81 mg by mouth daily.     06/06/2021   atorvastatin (LIPITOR) 20 MG tablet Take 20 mg by mouth at bedtime.   06/05/2021   Cholecalciferol (VITAMIN D3) 5000 UNITS CAPS Take 2,000 Units by mouth daily.   06/06/2021   clotrimazole-betamethasone (LOTRISONE) cream Apply 1 application. topically 2 (two) times daily.   unknown   estradiol (ESTRACE) 0.1 MG/GM vaginal cream Place vaginally.   unknown   metFORMIN (GLUCOPHAGE-XR) 500 MG 24 hr tablet Take 1 tablet (500 mg total) by mouth 2 (two) times daily.   06/05/2021   Methenamine-Sodium Salicylate (AZO URINARY TRACT DEFENSE PO) Take 2 capsules by mouth daily.   06/06/2021   Multiple Vitamins-Minerals (CENTRUM SILVER PO) Take 1 tablet by mouth daily.     06/06/2021   vitamin E 200 UNIT capsule Take by mouth.   06/06/2021   Scheduled:  aspirin  81 mg Oral Daily   atorvastatin  80 mg Oral QHS   dapagliflozin propanediol  10 mg Oral Daily   digoxin  0.125 mg Oral Daily   enoxaparin (LOVENOX) injection  40 mg Subcutaneous Q24H   furosemide  80 mg Intravenous BID   insulin aspart  0-5 Units Subcutaneous QHS   insulin aspart  0-9 Units Subcutaneous TID WC   multivitamin with minerals  1 tablet Oral Daily   pantoprazole  40 mg Oral Daily   sodium chloride flush  3 mL Intravenous Q12H   sodium chloride flush  3 mL Intravenous Q12H   spironolactone  25 mg Oral Daily   Continuous:  sodium chloride     sodium chloride     [id number] chloride, sodium chloride, acetaminophen, alum & mag hydroxide-simeth, magnesium hydroxide, nitroGLYCERIN, ondansetron (ZOFRAN) IV, sodium chloride flush, sodium chloride flush Anti-infectives (From admission, onward)    None       Results for orders placed or  performed during the hospital encounter of 06/06/21 (from the past 48 hour(s))  Glucose, capillary     Status: Abnormal   Collection Time: 06/08/21 11:18 AM  Result Value Ref Range   Glucose-Capillary 113 (H) 70 - 99 mg/dL    Comment: Glucose reference range applies only to samples taken after fasting for at least 8 hours.  Heparin level (unfractionated)     Status: None   Collection Time: 06/08/21 12:43 PM  Result Value Ref Range   Heparin Unfractionated 0.46 0.30 - 0.70 IU/mL    Comment: (NOTE) The clinical reportable range upper limit is being lowered to >1.10 to align with the FDA approved guidance for the current laboratory assay.  If  heparin results are below expected values, and patient dosage has  been confirmed, suggest follow up testing of antithrombin III levels. Performed at [facility], [address], [place], [place] [address]   POCT I-Stat EG7     Status: Abnormal   Collection Time: 06/08/21  2:36 PM  Result Value Ref Range   pH, Ven 7.407 7.25 - 7.43   pCO2, Ven 40.5 (L) 44 - 60 mmHg   pO2, Ven 29 (LL) 32 - 45 mmHg   Bicarbonate 25.5 20.0 - 28.0 mmol/L   TCO2 27 22 - 32 mmol/L   O2 Saturation 55 %   Acid-Base Excess 1.0 0.0 - 2.0 mmol/L   Sodium 137 135 - 145 mmol/L   Potassium 4.1 3.5 - 5.1 mmol/L   Calcium, Ion 1.16 1.15 - 1.40 mmol/L   HCT 34.0 (L) 36.0 - 46.0 %   Hemoglobin 11.6 (L) 12.0 - 15.0 g/dL   Sample type MIXED VENOUS SAMPLE   Glucose, capillary     Status: Abnormal   Collection Time: 06/08/21  4:43 PM  Result Value Ref Range   Glucose-Capillary 154 (H) 70 - 99 mg/dL    Comment: Glucose reference range applies only to samples taken after fasting for at least 8 hours.  Glucose, capillary     Status: Abnormal   Collection Time: 06/08/21  9:01 PM  Result Value Ref Range   Glucose-Capillary 233 (H) 70 - 99 mg/dL    Comment: Glucose reference range applies only to samples taken after fasting for at least 8 hours.  CBC     Status: Abnormal    Collection Time: 06/09/21  5:44 AM  Result Value Ref Range   WBC 6.1 4.0 - 10.5 K/uL   RBC 3.80 (L) 3.87 - 5.11 MIL/uL   Hemoglobin 10.9 (L) 12.0 - 15.0 g/dL   HCT 18.8 (L) 41.6 - 60.6 %   MCV 86.3 80.0 - 100.0 fL   MCH 28.7 26.0 - 34.0 pg   MCHC 33.2 30.0 - 36.0 g/dL   RDW 30.1 60.1 - 09.3 %   Platelets 335 150 - 400 K/uL   nRBC 0.0 0.0 - 0.2 %    Comment: Performed at [facility], [address] [place], [place] [address]  Prealbumin     Status: None   Collection Time: 06/09/21  5:44 AM  Result Value Ref Range   Prealbumin 19.1 18 - 38 mg/dL    Comment: Performed at [facility], [address] [place], [place] [address]  Comprehensive metabolic panel     Status: Abnormal   Collection Time: 06/09/21  5:44 AM  Result Value Ref Range   Sodium 136 135 - 145 mmol/L   Potassium 4.0 3.5 - 5.1 mmol/L   Chloride 100 98 - 111 mmol/L   CO2 26 22 - 32 mmol/L   Glucose, Bld 157 (H) 70 - 99 mg/dL    Comment: Glucose reference range applies only to samples taken after fasting for at least 8 hours.   BUN 24 (H) 8 - 23 mg/dL   Creatinine, Ser 5.42 0.44 - 1.00 mg/dL   Calcium 8.5 (L) 8.9 - 10.3 mg/dL   Total Protein 5.8 (L) 6.5 - 8.1 g/dL   Albumin 3.0 (L) 3.5 - 5.0 g/dL   AST 49 (H) 15 - 41 U/L   ALT 83 (H) 0 - 44 U/L   Alkaline Phosphatase 72 38 - 126 U/L   Total Bilirubin 0.8 0.3 -  1.2 mg/dL   GFR, Estimated >02 >11 mL/min    Comment: (NOTE) Calculated using the CKD-EPI Creatinine Equation (2021)    Anion gap 10 5 - 15    Comment: Performed at [facility], [address], [place], [place] [address]  Heparin level (unfractionated)     Status: None   Collection Time: 06/09/21  5:44 AM  Result Value Ref Range   Heparin Unfractionated 0.61 0.30 - 0.70 IU/mL    Comment: (NOTE) The clinical reportable range upper limit is being lowered to >1.10 to align with the FDA approved guidance for the current laboratory assay.  If heparin results are below expected  values, and patient dosage has  been confirmed, suggest follow up testing of antithrombin III levels. Performed at [facility], [address], [place], [place] [address]   Glucose, capillary     Status: Abnormal   Collection Time: 06/09/21  6:31 AM  Result Value Ref Range   Glucose-Capillary 156 (H) 70 - 99 mg/dL    Comment: Glucose reference range applies only to samples taken after fasting for at least 8 hours.  Glucose, capillary     Status: Abnormal   Collection Time: 06/09/21 11:16 AM  Result Value Ref Range   Glucose-Capillary 479 (H) 70 - 99 mg/dL    Comment: Glucose reference range applies only to samples taken after fasting for at least 8 hours.  Glucose, capillary     Status: Abnormal   Collection Time: 06/09/21 11:22 AM  Result Value Ref Range   Glucose-Capillary 269 (H) 70 - 99 mg/dL    Comment: Glucose reference range applies only to samples taken after fasting for at least 8 hours.  Glucose, capillary     Status: Abnormal   Collection Time: 06/09/21  4:30 PM  Result Value Ref Range   Glucose-Capillary 119 (H) 70 - 99 mg/dL    Comment: Glucose reference range applies only to samples taken after fasting for at least 8 hours.  Glucose, capillary     Status: Abnormal   Collection Time: 06/09/21  9:33 PM  Result Value Ref Range   Glucose-Capillary 250 (H) 70 - 99 mg/dL    Comment: Glucose reference range applies only to samples taken after fasting for at least 8 hours.  CBC     Status: Abnormal   Collection Time: 06/10/21 12:55 AM  Result Value Ref Range   WBC 8.5 4.0 - 10.5 K/uL   RBC 4.20 3.87 - 5.11 MIL/uL   Hemoglobin 12.1 12.0 - 15.0 g/dL   HCT 36.1 (L) 22.4 - 49.7 %   MCV 85.0 80.0 - 100.0 fL   MCH 28.8 26.0 - 34.0 pg   MCHC 33.9 30.0 - 36.0 g/dL   RDW 53.0 05.1 - 10.2 %   Platelets 346 150 - 400 K/uL   nRBC 0.0 0.0 - 0.2 %    Comment: Performed at [facility], [address], [place], [place] [address]  Glucose, capillary     Status: Abnormal    Collection Time: 06/10/21  6:13 AM  Result Value Ref Range   Glucose-Capillary 144 (H) 70 - 99 mg/dL    Comment: Glucose reference range applies only to samples taken after fasting for at least 8 hours.    CARDIAC CATHETERIZATION  Addendum Date: 06/09/2021   CONCLUSIONS: Ischemic cardiomyopathy with acute on chronic systolic heart failure, LVEDP 27 mmHg. CPO = 0.71. Widely patent left main Proximal to mid segmental calcified 95% LAD.  First diagonal of moderate size also 95% obstructed.  LAD and circumflex supply collaterals to the right coronary. Circumflex is widely patent with irregularities but no high-grade obstruction. Totally occluded proximal to mid right coronary.  Fills by left to right collaterals. Mild pulmonary artery hypertension with mean PA pressure 28 mmHg.  Etiology likely WHO group 2 etiology.  Capillary wedge mean pressure 23 mmHg.  Pulmonary vascular resistance 1.3 Wood units Elevated right atrial pressure, mean pressure 14 mmHg. PAPI = 1.6 Cardiac output 3.84 L/min; cardiac index 2.22 L/min/m. CPO = 0.71 Recommendations: Treat right and left heart failure. Consider PCI on LAD with plaque modification by orbital atherectomy or shockwave if anterior wall viability. Advanced heart failure team consult.   Result Date: 06/09/2021 CONCLUSIONS: Ischemic cardiomyopathy with acute on chronic systolic heart failure, LVEDP 27 mmHg. CPO = 0.71. Widely patent left main Proximal to mid segmental calcified 95% LAD.  First diagonal of moderate size also 95% obstructed.  LAD and circumflex supply collaterals to the right coronary. Circumflex is widely patent with irregularities but no high-grade obstruction. Totally occluded proximal to mid right coronary.  Fills by left to right collaterals. Mild pulmonary artery hypertension with mean PA pressure 28 mmHg.  Etiology likely WHO group 2 etiology.  Capillary wedge mean pressure 23 mmHg.  Pulmonary vascular resistance 1.3 Wood units Elevated right  atrial pressure, mean pressure 14 mmHg. PAPI = 1.6 Cardiac output 3.84 L/min; cardiac index 2.22 L/min/m. CPO = 0.71 Recommendations: To right and left heart failure. Consider PCI on LAD with plaque modification by orbital atherectomy or shockwave if anterior wall viability. Advanced heart failure team consult.    Review of Systems  Constitutional:  Positive for activity change and fatigue.  HENT: Negative.    Eyes: Negative.   Respiratory:  Positive for chest tightness and shortness of breath.   Cardiovascular:  Positive for chest pain and leg swelling.  Gastrointestinal: Negative.   Endocrine: Negative.   Genitourinary: Negative.   Musculoskeletal: Negative.   Skin: Negative.   Allergic/Immunologic: Negative.   Neurological:  Negative for dizziness and syncope.  Hematological: Negative.   Blood pressure 100/67, pulse 80, temperature 98.5 F (36.9 C), temperature source Oral, resp. rate 17, height 5\' 7"  (1.702 m), weight 55.8 kg, SpO2 95 %. Physical Exam Constitutional:      Appearance: She is well-developed and normal weight.  HENT:     Head: Normocephalic and atraumatic.  Eyes:     Extraocular Movements: Extraocular movements intact.     Pupils: Pupils are equal, round, and reactive to light.  Neck:     Vascular: No carotid bruit.  Cardiovascular:     Rate and Rhythm: Normal rate and regular rhythm.     Pulses: Normal pulses.     Heart sounds: Normal heart sounds. No murmur heard. Pulmonary:     Effort: Pulmonary effort is normal.     Breath sounds: Normal breath sounds.  Abdominal:     General: Abdomen is flat. Bowel sounds are normal.     Palpations: Abdomen is soft.     Tenderness: There is no abdominal tenderness.  Musculoskeletal:        General: No swelling. Normal range of motion.  Skin:    General: Skin is warm and dry.  Neurological:     General: No focal deficit present.     Mental Status: She is alert and oriented to person, place, and time.  Psychiatric:      Comments: She has difficulty focusing on  what I am telling her and keeps repeating the same statements about her car breaking down, her difficulty getting to her job, and her kids not helping her.     ECHOCARDIOGRAM REPORT         Patient Name:   Shelby Trevino Date of Exam: 06/07/2021  Medical Rec #:  [id number]        Height:       67.0 in  Accession #:    [id number]       Weight:       137.8 lb  Date of Birth:  29-Nov-1950        BSA:          1.726 m  Patient Age:    57 years         BP:           107/79 mmHg  Patient Gender: F                HR:           89 bpm.  Exam Location:  Inpatient   Procedure: 2D Echo, Color Doppler, Cardiac Doppler and Intracardiac             Opacification Agent   Indications:    I42.9 Cardiomyopathy (unspecified)     History:        Patient has prior history of Echocardiogram examinations,  most                  recent 12/14/2010. CAD; Risk Factors:Hypertension, Diabetes  and                  Dyslipidemia.     Sonographer:    Raquel Sarna Senior RDCS  Referring Phys: [id number] [place]     1. Left ventricular ejection fraction, by estimation, is 20 to 25%. The  left ventricle has severely decreased function. The left ventricle  demonstrates regional wall motion abnormalities (see scoring  diagram/findings for description). Left ventricular  diastolic parameters are consistent with Grade III diastolic dysfunction  (restrictive). Elevated left ventricular end-diastolic pressure. The mid  to apical antero-lateral, entire anterior septum, apex and basal anterior  walls are akinetic with findings  consistent with mid LAD occlusion, with hypokinesis in the remaining  myocardial wall segments.   2. Right ventricular systolic function is moderately reduced. The right  ventricular size is mildly enlarged. There is normal pulmonary artery  systolic pressure.   3. Left atrial size was mildly dilated.   4. Right atrial size was mildly  dilated.   5. The mitral valve is normal in structure. Moderate mitral valve  regurgitation. No evidence of mitral stenosis.   6. Tricuspid valve regurgitation is mild to moderate.   7. The aortic valve is tricuspid. Aortic valve regurgitation is not  visualized. Aortic valve sclerosis/calcification is present, without any  evidence of aortic stenosis.   8. The inferior vena cava is dilated in size with <50% respiratory  variability, suggesting right atrial pressure of 15 mmHg.   FINDINGS   Left Ventricle: Left ventricular ejection fraction, by estimation, is 20  to 25%. The left ventricle has severely decreased function. The left  ventricle demonstrates regional wall motion abnormalities. Definity  contrast agent was given IV to delineate  the left ventricular endocardial borders. The left ventricular internal  cavity size was normal in size. There is no left ventricular hypertrophy.  Left ventricular diastolic parameters are consistent with Grade III  diastolic dysfunction (restrictive).  Elevated left ventricular end-diastolic pressure.      LV Wall Scoring:  The antero-lateral wall, entire anterior septum, entire apex, and basal  anterior segment are akinetic. The inferior wall, posterior wall, mid  inferoseptal segment, mid anterior segment, and basal inferoseptal segment  are hypokinetic. The mid to apical antero-lateral, entire anterior septum,  apex and basal anterior walls are akinetic with findings consistent with  mid  LAD occlusion, with hypokinesis in the remaining myocardial wall segments.   Right Ventricle: The right ventricular size is mildly enlarged. No  increase in right ventricular wall thickness. Right ventricular systolic  function is moderately reduced. There is normal pulmonary artery systolic  pressure. The tricuspid regurgitant  velocity is 2.44 m/s, and with an assumed right atrial pressure of 8 mmHg,  the estimated right ventricular systolic pressure is  99991111 mmHg.   Left Atrium: Left atrial size was mildly dilated.   Right Atrium: Right atrial size was mildly dilated.   Pericardium: There is no evidence of pericardial effusion.   Mitral Valve: The mitral valve is normal in structure. Moderate mitral  valve regurgitation. No evidence of mitral valve stenosis.   Tricuspid Valve: The tricuspid valve is normal in structure. Tricuspid  valve regurgitation is mild to moderate. No evidence of tricuspid  stenosis.   Aortic Valve: The aortic valve is tricuspid. Aortic valve regurgitation is  not visualized. Aortic valve sclerosis/calcification is present, without  any evidence of aortic stenosis.   Pulmonic Valve: The pulmonic valve was not well visualized. Pulmonic valve  regurgitation is not visualized. No evidence of pulmonic stenosis.   Aorta: The aortic root is normal in size and structure.   Venous: The inferior vena cava is dilated in size with less than 50%  respiratory variability, suggesting right atrial pressure of 15 mmHg.   IAS/Shunts: No atrial level shunt detected by color flow Doppler.   Additional Comments: There is a small pleural effusion in the left lateral  region.      LEFT VENTRICLE  PLAX 2D  LVIDd:         4.80 cm   Diastology  LVIDs:         4.30 cm   LV e' medial:    3.70 cm/s  LV PW:         1.00 cm   LV E/e' medial:  28.6  LV IVS:        1.00 cm   LV e' lateral:   4.90 cm/s  LVOT diam:     1.80 cm   LV E/e' lateral: 21.6  LV SV:         14  LV SV Index:   8  LVOT Area:     2.54 cm      RIGHT VENTRICLE  RV S prime:     8.16 cm/s  TAPSE (M-mode): 1.3 cm   LEFT ATRIUM             Index        RIGHT ATRIUM           Index  LA diam:        3.70 cm 2.14 cm/m   RA Area:     22.60 cm  LA Vol (A2C):   61.5 ml 35.63 ml/m  RA Volume:   69.00 ml  39.97 ml/m  LA Vol (A4C):   70.8 ml 41.02 ml/m  LA Biplane Vol: 66.2 ml 38.35 ml/m   AORTIC VALVE  LVOT  Vmax:   45.80 cm/s  LVOT Vmean:  30.800 cm/s   LVOT VTI:    0.054 m     AORTA  Ao Root diam: 2.50 cm  Ao Asc diam:  3.00 cm   MITRAL VALVE                  TRICUSPID VALVE  MV Area (PHT): 4.83 cm       TR Peak grad:   23.8 mmHg  MV Decel Time: 157 msec       TR Vmax:        244.00 cm/s  MR Peak grad:    66.3 mmHg  MR Mean grad:    46.0 mmHg    SHUNTS  MR Vmax:         407.00 cm/s  Systemic VTI:  0.05 m  MR Vmean:        321.0 cm/s   Systemic Diam: 1.80 cm  MR PISA:         1.01 cm  MR PISA Eff ROA: 8 mm  MR PISA Radius:  0.40 cm  MV E velocity: 106.00 cm/s  MV A velocity: 44.90 cm/s  MV E/A ratio:  2.36   Kardie Tobb DO  Electronically signed by Berniece Salines DO  Signature Date/Time: 06/07/2021/11:05:43 AM         Final    Physicians  Panel Physicians Referring Physician Case Authorizing Physician  Belva Crome, MD (Primary)     Procedures  RIGHT/LEFT HEART CATH AND CORONARY ANGIOGRAPHY   Conclusion  CONCLUSIONS: Ischemic cardiomyopathy with acute on chronic systolic heart failure, LVEDP 27 mmHg. CPO = 0.71. Widely patent left main Proximal to mid segmental calcified 95% LAD.  First diagonal of moderate size also 95% obstructed.  LAD and circumflex supply collaterals to the right coronary. Circumflex is widely patent with irregularities but no high-grade obstruction. Totally occluded proximal to mid right coronary.  Fills by left to right collaterals. Mild pulmonary artery hypertension with mean PA pressure 28 mmHg.  Etiology likely WHO group 2 etiology.  Capillary wedge mean pressure 23 mmHg.  Pulmonary vascular resistance 1.3 Wood units Elevated right atrial pressure, mean pressure 14 mmHg. PAPI = 1.6 Cardiac output 3.84 L/min; cardiac index 2.22 L/min/m. CPO = 0.71   Recommendations: Treat right and left heart failure. Consider PCI on LAD with plaque modification by orbital atherectomy or shockwave if anterior wall viability. Advanced heart failure team consult.       Clinical  Presentation  CHF/Shock Congestive heart failure present. NYHA Class IV. No shock present.   Procedural Details  Technical Details The right radial area was sterilely prepped and draped. Intravenous sedation with Versed and fentanyl was administered. 1% Xylocaine was infiltrated to achieve local analgesia. Using real-time vascular ultrasound, a double wall stick with an angiocath was utilized to obtain intra-arterial access. A VUS image was saved for the permanent record.The modified Seldinger technique was used to place a 96F " Slender" sheath in the right radial artery. Weight based heparin was administered. Coronary angiography was done using 5 F catheters. Right coronary angiography was performed with a JR4. Left ventricular hemodymic recordings and angiography was done using the JR 4 catheter and hand injection. Left coronary angiography was performed with a JL 3.5 cm.  Right heart catheterization was performed by exchanging a previously placed antecubital IV angio-cath for a 5 French Slender sheath. 1% Xylocaine was used to locally nesthetize the area around the IV site. The IV catheter was  wired using an .018 guidewire. The modified Seldinger technique was used to place the 5 [place] sheath. Double glove technique was used to enhance sterility. After sheath insertion, right heart cath was performed using a 5 French balloon tipped catheter and fluoroscopic guidance. Pressures were recorded in each chamber and in the pulmonary capillary wedge position.. The main pulmonary artery O2 saturation was sampled.   Hemostasis was achieved using a pneumatic band.  During this procedure the patient is administered a total of Versed 0.5 mg and Fentanyl 25 mcg to achieve and maintain moderate conscious sedation.  The patient's heart rate, blood pressure, and oxygen saturation are monitored continuously during the procedure. The period of conscious sedation is 31 minutes, of which I was present face-to-face 100%  of this time.  Estimated blood loss <50 mL.   During this procedure medications were administered to achieve and maintain moderate conscious sedation while the patient's heart rate, blood pressure, and oxygen saturation were continuously monitored and I was present face-to-face 100% of this time.   Medications (Filter: Administrations occurring from 1402 to 1508 on 06/08/21)  important  Continuous medications are totaled by the amount administered until 06/08/21 1508.   Heparin (Porcine) in NaCl 1000-0.9 UT/500ML-% SOLN (mL) Total volume:  1,000 mL Date/Time Rate/Dose/Volume Action   06/08/21 1410 500 mL Given   1410 500 mL Given    fentaNYL (SUBLIMAZE) injection (mcg) Total dose:  25 mcg Date/Time Rate/Dose/Volume Action   06/08/21 1416 25 mcg Given    midazolam (VERSED) injection (mg) Total dose:  0.5 mg Date/Time Rate/Dose/Volume Action   06/08/21 1416 0.5 mg Given    lidocaine (PF) (XYLOCAINE) 1 % injection (mL) Total volume:  5 mL Date/Time Rate/Dose/Volume Action   06/08/21 1417 5 mL Given    Radial Cocktail/Verapamil only (mL) Total volume:  10 mL Date/Time Rate/Dose/Volume Action   06/08/21 1422 10 mL Given    heparin sodium (porcine) injection (Units) Total dose:  3,000 Units Date/Time Rate/Dose/Volume Action   06/08/21 1437 3,000 Units Given    iohexol (OMNIPAQUE) 350 MG/ML injection (mL) Total volume:  55 mL Date/Time Rate/Dose/Volume Action   06/08/21 1448 55 mL Given    0.9 %  sodium chloride infusion (mL) Total dose:  Cannot be calculated* Dosing weight:  63.7 *Administration dose not documented Date/Time Rate/Dose/Volume Action   06/08/21 1402 *Not included in total MAR Hold    alum & mag hydroxide-simeth (MAALOX/MYLANTA) 200-200-20 MG/5ML suspension 30 mL (mL) Total dose:  Cannot be calculated* Dosing weight:  62.5 *Administration dose not documented Date/Time Rate/Dose/Volume Action   06/08/21 1402 *Not included in total MAR Hold    insulin  aspart (novoLOG) injection 0-5 Units (Units) Total dose:  Cannot be calculated* Dosing weight:  63.7 *Administration dose not documented Date/Time Rate/Dose/Volume Action   06/08/21 1402 *Not included in total MAR Hold    insulin aspart (novoLOG) injection 0-9 Units (Units) Total dose:  Cannot be calculated* Dosing weight:  63.7 *Administration dose not documented Date/Time Rate/Dose/Volume Action   06/08/21 1402 *Not included in total MAR Hold    multivitamin with minerals tablet 1 tablet (tablet) Total dose:  Cannot be calculated* Dosing weight:  62.5 *Administration dose not documented Date/Time Rate/Dose/Volume Action   06/08/21 1402 *Not included in total MAR Hold    nitroGLYCERIN (NITROSTAT) SL tablet 0.4 mg (mg) Total dose:  Cannot be calculated* Dosing weight:  62.5 *Administration dose not documented Date/Time Rate/Dose/Volume Action   06/08/21 1402 *Not included in total MAR Hold  pantoprazole (PROTONIX) EC tablet 40 mg (mg) Total dose:  Cannot be calculated* Dosing weight:  62.5 *Administration dose not documented Date/Time Rate/Dose/Volume Action   06/08/21 1402 *Not included in total MAR Hold    sodium chloride flush (NS) 0.9 % injection 3 mL (mL) Total dose:  Cannot be calculated* Dosing weight:  63.7 *Administration dose not documented Date/Time Rate/Dose/Volume Action   06/08/21 1402 *Not included in total MAR Hold    sodium chloride flush (NS) 0.9 % injection 3 mL (mL) Total dose:  Cannot be calculated* Dosing weight:  63.7 *Administration dose not documented Date/Time Rate/Dose/Volume Action   06/08/21 1402 *Not included in total MAR Hold    acetaminophen (TYLENOL) tablet 650 mg (mg) Total dose:  Cannot be calculated* Dosing weight:  63.7 *Administration dose not documented Date/Time Rate/Dose/Volume Action   06/08/21 1402 *Not included in total MAR Hold    aspirin EC tablet 81 mg (mg) Total dose:  Cannot be calculated* *Administration dose not  documented Date/Time Rate/Dose/Volume Action   06/08/21 1402 *Not included in total MAR Hold    atorvastatin (LIPITOR) tablet 20 mg (mg) Total dose:  Cannot be calculated* Dosing weight:  63.7 *Administration dose not documented Date/Time Rate/Dose/Volume Action   06/08/21 1402 *Not included in total MAR Hold    feeding supplement (ENSURE ENLIVE / ENSURE PLUS) liquid 237 mL (mL) Total dose:  Cannot be calculated* Dosing weight:  62.5 *Administration dose not documented Date/Time Rate/Dose/Volume Action   06/08/21 1402 *Not included in total MAR Hold    ondansetron (ZOFRAN) injection 4 mg (mg) Total dose:  Cannot be calculated* Dosing weight:  63.7 *Administration dose not documented Date/Time Rate/Dose/Volume Action   06/08/21 1402 *Not included in total MAR Hold    Sedation Time  Sedation Time Physician-1: 30 minutes 39 seconds Contrast  Medication Name Total Dose  iohexol (OMNIPAQUE) 350 MG/ML injection 55 mL   Radiation/Fluoro  Fluoro time: 6.5 (min) DAP: 13702 (mGycm2) Cumulative Air Kerma: 123XX123 (mGy) Complications  Complications documented before study signed (06/09/2021 0000000 AM)   No complications were associated with this study.  Documented by Bard Herbert, RN - 06/08/2021  2:52 PM     Coronary Findings  Diagnostic Dominance: Right Left Anterior Descending  There is mild diffuse disease throughout the vessel.  Prox LAD to Mid LAD lesion is 95% stenosed.  Mid LAD lesion is 65% stenosed.    First Diagonal Branch  Vessel is small in size.  1st Diag lesion is 95% stenosed.    Left Circumflex  The vessel exhibits minimal luminal irregularities.    First Obtuse Marginal Branch  Vessel is large in size. The vessel exhibits minimal luminal irregularities.    Second Obtuse Marginal Branch  Vessel is small in size.    Third Obtuse Marginal Branch  Vessel is small in size.    Right Coronary Artery  Prox RCA lesion is 100% stenosed.    Right Posterior  Descending Artery  Collaterals  RPDA filled by collaterals from 1st Sept.      Intervention   No interventions have been documented.   Right Heart  Right Heart Pressures Hemodynamic findings consistent with mild pulmonary hypertension. Elevated LV EDP consistent with volume overload.   Left Heart  Left Ventricle LV end diastolic pressure is severely elevated.   Coronary Diagrams  Diagnostic Dominance: Right Intervention  Implants     No implant documentation for this case.   Syngo Images   Show images for CARDIAC CATHETERIZATION Images on Long  Term Storage   Show images for Tijah, Almas to Procedure Log  Procedure Log    Hemo Data  Flowsheet Row Most Recent Value  Fick Cardiac Output 3.84 L/min  Fick Cardiac Output Index 2.22 (L/min)/BSA  RA A Wave 17 mmHg  RA V Wave 16 mmHg  RA Mean 14 mmHg  RV Systolic Pressure 42 mmHg  RV Diastolic Pressure 7 mmHg  RV EDP 16 mmHg  PA Systolic Pressure 40 mmHg  PA Diastolic Pressure 18 mmHg  PA Mean 28 mmHg  PW A Wave 29 mmHg  PW V Wave 30 mmHg  PW Mean 23 mmHg  AO Systolic Pressure 123XX123 mmHg  AO Diastolic Pressure 63 mmHg  AO Mean 86 mmHg  LV Systolic Pressure 99 mmHg  LV Diastolic Pressure 7 mmHg  LV EDP 27 mmHg  AOp Systolic Pressure XX123456 mmHg  AOp Diastolic Pressure 62 mmHg  AOp Mean Pressure 83 mmHg  LVp Systolic Pressure 123XX123 mmHg  LVp Diastolic Pressure 11 mmHg  LVp EDP Pressure 29 mmHg  QP/QS 1  TPVR Index 12.62 HRUI  TSVR Index 38.76 HRUI  PVR SVR Ratio 0.07  TPVR/TSVR Ratio 0.33     Assessment/Plan:  This 71 year old woman has severe three-vessel coronary disease presenting with 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  She had elevated troponins in the 500s with Q waves inferiorly and anteriorly on her EKG suggesting prior MIs.  Her ejection fraction is 20 to 25% by echocardiogram  with akinesis of the myocardium supplied by the LAD and RCA.  She has moderate mitral regurgitation likely due to left ventricular dysfunction.  Her LAD and distal right coronary artery appears suitable for grafting but I think a cardiac MRI is indicated to assess viability.  I am somewhat concerned about her baseline mental status.  There is some discrepancy between what she is telling me and what her children have told the staff.  In my discussions with her she asked that she has some underlying dementia with lack of focus and repeating the same statements over and over.  I will review the results of her cardiac MRI and hopefully talk with her children to get a better idea about her level of functioning at home.  Gaye Pollack 06/10/2021

## 2021-06-10 NOTE — Progress Notes (Addendum)
Short RP tachycardia with ventricular rate 144 bpm. Started at 18:18 and developed CP ~30 minutes after onset, mild but present. Has severely reduced EF and reduced CI. ADO 6 mg through L 20 ga PIV terminated SVT. K (4.3) and Mg (2.5). Did not start on metop or other AVN agents with lactate mildly elevated (2.0) and borderline cardiogenic shock on RHC. Will follow up overnight and ensure she is not going into cardiogenic shock. Sx resolved following conversion to NSR. Currently HR 90-100 which likely is compensatory.

## 2021-06-10 NOTE — Significant Event (Signed)
Rapid Response Event Note   Reason for Call :  SVT  Initial Focused Assessment:   Pt sitting in bed no c/o pain or distress noted. HR 142, BP 104/80, RR 20, spO2 95% on 2L [place]    Interventions:  PTA: EKG- SVT rate 140.  Dr Cherly Beach at bedside. 6mg  Adenosine given IV. HR converted to SR 90s.   Plan of Care:  Continue to monitor pt HR. RN instructed to call with any changes or concerns   Event Summary:   MD Notified: 1843 Call Time:1840 Arrival Time: 1842 End Time:  [id number], RN

## 2021-06-10 NOTE — Progress Notes (Addendum)
At 1830, patient began having sustained SVT, HR 140-s---I have paged Dr Cristal Deer and Dr Cherly Beach, I have not heard back from either one, rapid nurse has been called to room, I have placed EKG on patient's chart, patient is asymptomatic, bp 104/80, RR20, 96% on 2L--dr carlisle came to room, called rapid nurse back to room, zoll pads attached and zoll turned on, rapid nurse has admin 6mg /78ml adenosine in 20G LAC, follwed by flush, patient's heart rate returned back to 98, bp 121/85 and is talking to 3m, alert/oriented x4--zoll pads have been left on patient in case of another occurrence overnight, if it happens again, call MD back to room

## 2021-06-10 NOTE — Progress Notes (Signed)
Mobility Specialist Progress Note:   06/10/21 1027  Mobility  Activity Ambulated with assistance in hallway  Level of Assistance Contact guard assist, steadying assist  Assistive Device None  Distance Ambulated (ft) 150 ft  Activity Response Tolerated well  $Mobility charge 1 Mobility   Pt received in bed willing to participate in mobility. No complaints of pain. Left in chair with call bell in reach and all needs met.   [facility] Samer Dutton Mobility Specialist

## 2021-06-11 ENCOUNTER — Inpatient Hospital Stay (HOSPITAL_COMMUNITY): Payer: Medicare Other

## 2021-06-11 ENCOUNTER — Encounter (HOSPITAL_COMMUNITY): Payer: Self-pay | Admitting: Interventional Cardiology

## 2021-06-11 DIAGNOSIS — I5023 Acute on chronic systolic (congestive) heart failure: Secondary | ICD-10-CM | POA: Diagnosis not present

## 2021-06-11 LAB — POCT I-STAT EG7
Acid-Base Excess: 0 mmol/L (ref 0.0–2.0)
Bicarbonate: 24.5 mmol/L (ref 20.0–28.0)
Calcium, Ion: 1.14 mmol/L — ABNORMAL LOW (ref 1.15–1.40)
HCT: 34 % — ABNORMAL LOW (ref 36.0–46.0)
Hemoglobin: 11.6 g/dL — ABNORMAL LOW (ref 12.0–15.0)
O2 Saturation: 53 %
Potassium: 4 mmol/L (ref 3.5–5.1)
Sodium: 137 mmol/L (ref 135–145)
TCO2: 26 mmol/L (ref 22–32)
pCO2, Ven: 39.8 mmHg — ABNORMAL LOW (ref 44–60)
pH, Ven: 7.398 (ref 7.25–7.43)
pO2, Ven: 28 mmHg — CL (ref 32–45)

## 2021-06-11 LAB — BASIC METABOLIC PANEL
Anion gap: 8 (ref 5–15)
BUN: 17 mg/dL (ref 8–23)
CO2: 28 mmol/L (ref 22–32)
Calcium: 8.7 mg/dL — ABNORMAL LOW (ref 8.9–10.3)
Chloride: 95 mmol/L — ABNORMAL LOW (ref 98–111)
Creatinine, Ser: 1.08 mg/dL — ABNORMAL HIGH (ref 0.44–1.00)
GFR, Estimated: 55 mL/min — ABNORMAL LOW (ref >60)
Glucose, Bld: 143 mg/dL — ABNORMAL HIGH (ref 70–99)
Potassium: 3.9 mmol/L (ref 3.5–5.1)
Sodium: 131 mmol/L — ABNORMAL LOW (ref 135–145)

## 2021-06-11 LAB — LIPOPROTEIN A (LPA): Lipoprotein (a): 347.1 nmol/L — ABNORMAL HIGH (ref <75.0)

## 2021-06-11 LAB — POCT I-STAT 7, (LYTES, BLD GAS, ICA,H+H)
Acid-base deficit: 3 mmol/L — ABNORMAL HIGH (ref 0.0–2.0)
Bicarbonate: 20.6 mmol/L (ref 20.0–28.0)
Calcium, Ion: 1.12 mmol/L — ABNORMAL LOW (ref 1.15–1.40)
HCT: 33 % — ABNORMAL LOW (ref 36.0–46.0)
Hemoglobin: 11.2 g/dL — ABNORMAL LOW (ref 12.0–15.0)
O2 Saturation: 92 %
Potassium: 3.8 mmol/L (ref 3.5–5.1)
Sodium: 129 mmol/L — ABNORMAL LOW (ref 135–145)
TCO2: 22 mmol/L (ref 22–32)
pCO2 arterial: 32.5 mmHg (ref 32–48)
pH, Arterial: 7.41 (ref 7.35–7.45)
pO2, Arterial: 63 mmHg — ABNORMAL LOW (ref 83–108)

## 2021-06-11 LAB — GLUCOSE, CAPILLARY
Glucose-Capillary: 132 mg/dL — ABNORMAL HIGH (ref 70–99)
Glucose-Capillary: 191 mg/dL — ABNORMAL HIGH (ref 70–99)
Glucose-Capillary: 148 mg/dL — ABNORMAL HIGH (ref 70–99)
Glucose-Capillary: 208 mg/dL — ABNORMAL HIGH (ref 70–99)

## 2021-06-11 LAB — CBC
HCT: 37.2 % (ref 36.0–46.0)
Hemoglobin: 12.6 g/dL (ref 12.0–15.0)
MCH: 28.6 pg (ref 26.0–34.0)
MCHC: 33.9 g/dL (ref 30.0–36.0)
MCV: 84.5 fL (ref 80.0–100.0)
Platelets: 332 10*3/uL (ref 150–400)
RBC: 4.4 MIL/uL (ref 3.87–5.11)
RDW: 14.3 % (ref 11.5–15.5)
WBC: 6.2 10*3/uL (ref 4.0–10.5)
nRBC: 0 % (ref 0.0–0.2)

## 2021-06-11 MED ORDER — POTASSIUM CHLORIDE CRYS ER 20 MEQ PO TBCR
20.0000 meq | EXTENDED_RELEASE_TABLET | Freq: Once | ORAL | Status: AC
  Administered 2021-06-11: 20 meq via ORAL
  Filled 2021-06-11: qty 1

## 2021-06-11 MED ORDER — FUROSEMIDE 40 MG PO TABS
40.0000 mg | ORAL_TABLET | Freq: Every day | ORAL | Status: DC
  Administered 2021-06-11 – 2021-06-15 (×5): 40 mg via ORAL
  Filled 2021-06-11 (×5): qty 1

## 2021-06-11 MED ORDER — GADOBUTROL 1 MMOL/ML IV SOLN
8.0000 mL | Freq: Once | INTRAVENOUS | Status: AC | PRN
  Administered 2021-06-11: 8 mL via INTRAVENOUS

## 2021-06-11 MED ORDER — LOSARTAN POTASSIUM 25 MG PO TABS
12.5000 mg | ORAL_TABLET | Freq: Every day | ORAL | Status: DC
  Administered 2021-06-11 – 2021-06-14 (×4): 12.5 mg via ORAL
  Filled 2021-06-11 (×4): qty 1

## 2021-06-11 NOTE — Progress Notes (Signed)
Mobility Specialist Progress Note    06/11/21 1111  Mobility  Activity Ambulated with assistance in hallway  Level of Assistance Contact guard assist, steadying assist  Assistive Device Other (Comment) (HHA)  Distance Ambulated (ft) 110 ft  Activity Response Tolerated fair  $Mobility charge 1 Mobility   Pre-Mobility: 79 HR, 97/67 BP, 95% SpO2 During Mobility: 96% SpO2 Post-Mobility: 79 HR, 96% SpO2  Pt received in bed and agreeable. C/o being tired. Stumbled a few times on walk but recovered with minA. Returned to bed with call bell in reach, RN notified.   Hildred Alamin Mobility Specialist

## 2021-06-11 NOTE — Progress Notes (Addendum)
Patient ID: Shelby Trevino, female   DOB: [birth year], 71 y.o.   MRN: [id number]     Advanced Heart Failure Rounding Note  PCP-Cardiologist: None   Subjective:    Developed SVT overnight, followed by CP. Cards fellow gave adenosine 6 mg x 1, w/ successful termination of rhythm and resolution of CP.   2.8L in UOP yesterday. Wt down an additional 6 lb.   SCr stable, 1.08   K 3.9 Mg 2.5   BPs remain soft, low 100s systolic.   Feeling better today. No active CP. Denies dyspnea. Sitting up eating breakfast. No current complaints.   Objective:   Weight Range: 53.1 kg Body mass index is 18.33 kg/m.   Vital Signs:   Temp:  [98 F (36.7 C)-98.7 F (37.1 C)] 98.7 F (37.1 C) (06/05 0319) Pulse Rate:  [76-144] 76 (06/05 0319) Resp:  [14-23] 17 (06/05 0319) BP: (94-121)/(60-85) 100/63 (06/05 0319) SpO2:  [92 %-100 %] 92 % (06/05 0319) Weight:  [53.1 kg] 53.1 kg (06/05 0319) Last BM Date : 06/05/21  Weight change: Filed Weights   06/09/21 0447 06/10/21 0414 06/11/21 0319  Weight: 59.3 kg 55.8 kg 53.1 kg    Intake/Output:   Intake/Output Summary (Last 24 hours) at 06/11/2021 0732 Last data filed at 06/11/2021 0319 Gross per 24 hour  Intake 600 ml  Output 2750 ml  Net -2150 ml      Physical Exam   General:  Well appearing, thin elderly female. No respiratory difficulty HEENT: normal Neck: supple. no JVD. Carotids 2+ bilat; no bruits. No lymphadenopathy or thyromegaly appreciated. Cor: PMI nondisplaced. Regular rate & rhythm. No rubs, gallops or murmurs. Lungs: clear Abdomen: soft, nontender, nondistended. No hepatosplenomegaly. No bruits or masses. Good bowel sounds. Extremities: no cyanosis, clubbing, rash, edema Neuro: alert & oriented x 3, cranial nerves grossly intact. moves all 4 extremities w/o difficulty. Affect pleasant.   Telemetry   NSR 90 (personally reviewed)  Labs    CBC Recent Labs    06/10/21 0055 06/11/21 0058  WBC 8.5 6.2  HGB 12.1 12.6   HCT 35.7* 37.2  MCV 85.0 84.5  PLT 346 332   Basic Metabolic Panel Recent Labs    31/51/76 1930 06/11/21 0058  NA 136 131*  K 4.3 3.9  CL 94* 95*  CO2 28 28  GLUCOSE 178* 143*  BUN 19 17  CREATININE 1.08* 1.08*  CALCIUM 9.3 8.7*  MG 2.5*  --    Liver Function Tests Recent Labs    06/09/21 0544  AST 49*  ALT 83*  ALKPHOS 72  BILITOT 0.8  PROT 5.8*  ALBUMIN 3.0*   No results for input(s): LIPASE, AMYLASE in the last 72 hours. Cardiac Enzymes No results for input(s): CKTOTAL, CKMB, CKMBINDEX, TROPONINI in the last 72 hours.  BNP: BNP (last 3 results) Recent Labs    06/06/21 1757  BNP 2,924.0*    ProBNP (last 3 results) No results for input(s): PROBNP in the last 8760 hours.   D-Dimer No results for input(s): DDIMER in the last 72 hours. Hemoglobin A1C No results for input(s): HGBA1C in the last 72 hours.  Fasting Lipid Panel No results for input(s): CHOL, HDL, LDLCALC, TRIG, CHOLHDL, LDLDIRECT in the last 72 hours. Thyroid Function Tests No results for input(s): TSH, T4TOTAL, T3FREE, THYROIDAB in the last 72 hours.  Invalid input(s): FREET3   Other results:   Imaging    No results found.   Medications:     Scheduled Medications:  aspirin  81 mg Oral Daily   atorvastatin  80 mg Oral QHS   dapagliflozin propanediol  10 mg Oral Daily   digoxin  0.125 mg Oral Daily   enoxaparin (LOVENOX) injection  40 mg Subcutaneous Q24H   furosemide  80 mg Intravenous BID   insulin aspart  0-5 Units Subcutaneous QHS   insulin aspart  0-9 Units Subcutaneous TID WC   multivitamin with minerals  1 tablet Oral Daily   pantoprazole  40 mg Oral Daily   sodium chloride flush  3 mL Intravenous Q12H   sodium chloride flush  3 mL Intravenous Q12H   spironolactone  25 mg Oral Daily    Infusions:  sodium chloride     sodium chloride      PRN Medications: sodium chloride, sodium chloride, acetaminophen, alum & mag hydroxide-simeth, magnesium hydroxide,  nitroGLYCERIN, ondansetron (ZOFRAN) IV, sodium chloride flush, sodium chloride flush   Assessment/Plan   1. Acute on chronic systolic CHF: Ischemic cardiomyopathy.  Echo this admission with EF 20-25% with moderate RV dysfunction and moderate MR, dilated IVC.  With inferior and anterior Qs on ECG, she may have significant non-viable territory in her LV.  RHC on 6/2 showed elevated filling pressures with CI 2.22. She diuresed well w/ IV Lasix. Appears euvolemic on exam - transition to PO Lasix 40 mg daily  - digoxin 0.125 daily.  - Continue spironolactone 25 daily.  - Continue Farxiga 10 daily.  - BP too soft for Entresto. Start Losartan 12.5 mg qhs  - Will need to decide on revascularization (see below).  Have ordered cardiac MRI for viability.  2. CAD: Admitted with CHF but had anginal-type symptoms as well.  HS-TnI at admission did not suggest ACS at the time of admission.  Inferior and anterior Qs suggested out of hospital MI.  Cath on 6/2 showed severe proximal to mid LAD and proximal D1 disease as well as occluded RCA with collaterals.  She has potential CABG targets.  She is a diabetic with systolic CHF.  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  There is some question about her memory.   - Continue ASA  - atorvastatin 80 mg daily.  - I will arrange for cMRI for viability evaluation.  If she has significant viability in RCA and LAD territories, will need to consider whether she is a CABG candidate => Dr. Laneta Simmers has seen.  I do have some questions about her memory, and low EF puts her at higher risk.  She is a diabetic.  Prealbumin is normal.  PCI to the LAD would also be an option if we decide no CABG.  3. Type 2 diabetes: Added [place].  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  terminated w/ adenosine x 1  - keep K > 4.0 and Mg > 2.0   Continue to mobile. CR consult placed.    Length of Stay: [address], PA-C  06/11/2021, 7:32 AM  Advanced  Heart Failure Team Pager [phone] (M-F; 7a - 5p)  Please contact [facility] for night-coverage after hours (5p -7a ) and weekends on [email or web address]   Agree with the above PA note.   Patient had episode of AVNRT last night terminated by adenosine x 1.  She has history of AVNRT with prior ablation.   Good diuresis yesterday, weight down.  SBP 100s.   General: NAD Neck: No JVD, no thyromegaly or thyroid nodule.  Lungs: Clear to auscultation bilaterally with normal respiratory effort.  CV: Nondisplaced PMI.  Heart regular S1/S2, no S3/S4, no murmur.  No peripheral edema.   Abdomen: Soft, nontender, no hepatosplenomegaly, no distention.  Skin: Intact without lesions or rashes.  Neurologic: Alert and oriented x 3.  Psych: Normal affect. Extremities: No clubbing or cyanosis.  HEENT: Normal.   Stop IV lasix, today, start Lasix 40 mg po daily.  Will also add losartan 12.5 mg qhs.    She is going for cMRI today, will assess for viability.  Will need to make decision on CABG versus LAD PCI.   Marca Ancona 06/11/2021 8:36 AM

## 2021-06-11 NOTE — Plan of Care (Signed)
  Problem: Elimination: Goal: Will not experience complications related to bowel motility Outcome: Progressing Goal: Will not experience complications related to urinary retention Outcome: Progressing   Problem: Pain Managment: Goal: General experience of comfort will improve Outcome: Progressing   Problem: Safety: Goal: Ability to remain free from injury will improve Outcome: Progressing   Problem: Skin Integrity: Goal: Risk for impaired skin integrity will decrease Outcome: Progressing   Problem: Coping: Goal: Ability to adjust to condition or change in health will improve Outcome: Progressing

## 2021-06-11 NOTE — Care Management Important Message (Signed)
Important Message  Patient Details  Name: Shelby Trevino MRN: [id number] Date of Birth: July 08, 1950   Medicare Important Message Given:  Yes     Dorena Bodo 06/11/2021, 3:13 PM

## 2021-06-11 NOTE — TOC Initial Note (Signed)
Transition of Care [facility]) - Initial/Assessment Note    Patient Details  Name: Shelby Trevino MRN: [id number] Date of Birth: 1950-09-11  Transition of Care [facility]) CM/SW Contact:    Elliot Cousin, RN Phone Number: [phone] 06/11/2021, 1:40 PM  Clinical Narrative:                 HF TOC CM spoke to pt and gave permission to speak to dtr, Marylene Land. [place] Pt was working full-time prior to hospital stay. Explained waiting PT/OT recommendations after procedure. Offered choice for [facility]. Dtr agreeable to [facility]. States both her and pt's son live in [place]. Pt will most likely stay with son post dc. Dtr states she will speak to her brother. Contacted [facility] rep, Amy with new referral for [facility]. Will continue to follow for dc needs.   Will request meds come up from [facility] pharmacy at dc.   Expected Discharge Plan: Home w Home Health Services Barriers to Discharge: Continued Medical Work up   Patient Goals and CMS Choice Patient states their goals for this hospitalization and ongoing recovery are:: Wants to get better CMS Medicare.gov Compare Post Acute Care list provided to:: Patient Represenative (must comment) (daughter-Angela) Choice offered to / list presented to : Adult Children  Expected Discharge Plan and Services Expected Discharge Plan: Home w Home Health Services In-house Referral: NA Discharge Planning Services: CM Consult Post Acute Care Choice: Home Health Living arrangements for the past 2 months: Single Family Home                 DME Arranged: N/A         HH Arranged: RN, PT HH Agency: [facility] Date HH Agency Contacted: 06/11/21 Time HH Agency Contacted: 1339 Representative spoke with at [facility] Agency: Mayra Reel  Prior Living Arrangements/Services Living arrangements for the past 2 months: Single Family Home Lives with:: Self Patient language and need for interpreter reviewed:: Yes Do you feel safe going  back to the place where you live?: Yes      Need for Family Participation in Patient Care: Yes (Comment) Care giver support system in place?: Yes (comment) Current home services:  (n/a) Criminal Activity/Legal Involvement Pertinent to Current Situation/Hospitalization: No - Comment as needed  Activities of Daily Living Home Assistive Devices/Equipment: Eyeglasses ADL Screening (condition at time of admission) Patient's cognitive ability adequate to safely complete daily activities?: Yes Is the patient deaf or have difficulty hearing?: No Does the patient have difficulty seeing, even when wearing glasses/contacts?: No Does the patient have difficulty concentrating, remembering, or making decisions?: No Patient able to express need for assistance with ADLs?: Yes Does the patient have difficulty dressing or bathing?: No Independently performs ADLs?: Yes (appropriate for developmental age) Does the patient have difficulty walking or climbing stairs?: Yes Weakness of Legs: Both Weakness of Arms/Hands: None  Permission Sought/Granted Permission sought to share information with : Family Supports, PCP, Case Manager Permission granted to share information with : Yes, Verbal Permission Granted  Share Information with NAME: Erla, Bacchi  Permission granted to share info w AGENCY: Home Health  Permission granted to share info w Relationship: daughter, son  Permission granted to share info w Contact Information: [phone], [phone]  Emotional Assessment Appearance:: Appears stated age Attitude/Demeanor/Rapport: Engaged Affect (typically observed): Accepting Orientation: : Oriented to Self, Oriented to Place, Oriented to  Time, Oriented to  Situation Alcohol / Substance Use: Not Applicable Psych Involvement: No (comment)  Admission diagnosis:  ACS (acute coronary syndrome) (HCC) [I24.9] Acute on chronic congestive heart failure, unspecified heart failure type [facility])  [I50.9] Patient Active Problem List   Diagnosis Date Noted   Acute on chronic congestive heart failure (HCC)    Ischemic cardiomyopathy    Malnutrition of moderate degree 06/07/2021   ACS (acute coronary syndrome) (HCC) 06/06/2021   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 [place] [address] Phone: [phone] Fax: [phone]  [facility] [address] [place] [address] Phone: [phone] Fax: [phone]     Social Determinants of Health (SDOH) Interventions    Readmission Risk Interventions    06/07/2021    2:36 PM  Readmission Risk Prevention Plan  Post Dischage Appt Complete  Transportation Screening Complete

## 2021-06-11 NOTE — Plan of Care (Signed)
  Problem: Clinical Measurements: Goal: Respiratory complications will improve Outcome: Progressing   Problem: Coping: Goal: Level of anxiety will decrease Outcome: Progressing   Problem: Pain Managment: Goal: General experience of comfort will improve Outcome: Progressing   Problem: Safety: Goal: Ability to remain free from injury will improve Outcome: Progressing   Problem: Skin Integrity: Goal: Risk for impaired skin integrity will decrease Outcome: Progressing   Problem: Tissue Perfusion: Goal: Adequacy of tissue perfusion will improve Outcome: Progressing

## 2021-06-12 ENCOUNTER — Other Ambulatory Visit (HOSPITAL_COMMUNITY): Payer: Self-pay

## 2021-06-12 DIAGNOSIS — I249 Acute ischemic heart disease, unspecified: Secondary | ICD-10-CM | POA: Diagnosis not present

## 2021-06-12 DIAGNOSIS — I5023 Acute on chronic systolic (congestive) heart failure: Secondary | ICD-10-CM | POA: Diagnosis not present

## 2021-06-12 LAB — BASIC METABOLIC PANEL
Anion gap: 9 (ref 5–15)
BUN: 17 mg/dL (ref 8–23)
CO2: 28 mmol/L (ref 22–32)
Calcium: 9 mg/dL (ref 8.9–10.3)
Chloride: 99 mmol/L (ref 98–111)
Creatinine, Ser: 0.91 mg/dL (ref 0.44–1.00)
GFR, Estimated: >60 mL/min (ref >60)
Glucose, Bld: 110 mg/dL — ABNORMAL HIGH (ref 70–99)
Potassium: 4.5 mmol/L (ref 3.5–5.1)
Sodium: 136 mmol/L (ref 135–145)

## 2021-06-12 LAB — GLUCOSE, CAPILLARY
Glucose-Capillary: 127 mg/dL — ABNORMAL HIGH (ref 70–99)
Glucose-Capillary: 191 mg/dL — ABNORMAL HIGH (ref 70–99)
Glucose-Capillary: 173 mg/dL — ABNORMAL HIGH (ref 70–99)

## 2021-06-12 MED ORDER — SODIUM CHLORIDE 0.9 % IV SOLN: INTRAVENOUS | Status: DC

## 2021-06-12 MED ORDER — CLOPIDOGREL BISULFATE 300 MG PO TABS
300.0000 mg | ORAL_TABLET | Freq: Once | ORAL | Status: AC
  Administered 2021-06-12: 300 mg via ORAL
  Filled 2021-06-12: qty 1

## 2021-06-12 MED ORDER — SODIUM CHLORIDE 0.9% FLUSH
3.0000 mL | Freq: Two times a day (BID) | INTRAVENOUS | Status: DC
  Administered 2021-06-12 – 2021-06-15 (×4): 3 mL via INTRAVENOUS

## 2021-06-12 MED ORDER — ASPIRIN 81 MG PO CHEW
81.0000 mg | CHEWABLE_TABLET | ORAL | Status: AC
  Administered 2021-06-13: 81 mg via ORAL
  Filled 2021-06-12: qty 1

## 2021-06-12 MED ORDER — SODIUM CHLORIDE 0.9 % IV SOLN: 250.0000 mL | INTRAVENOUS | Status: DC | PRN

## 2021-06-12 MED ORDER — CLOPIDOGREL BISULFATE 75 MG PO TABS
75.0000 mg | ORAL_TABLET | Freq: Every day | ORAL | Status: DC
  Administered 2021-06-13 – 2021-06-15 (×3): 75 mg via ORAL
  Filled 2021-06-12 (×4): qty 1

## 2021-06-12 MED ORDER — SODIUM CHLORIDE 0.9% FLUSH: 3.0000 mL | INTRAVENOUS | Status: DC | PRN

## 2021-06-12 NOTE — Evaluation (Addendum)
Physical Therapy Evaluation Patient Details Name: Shelby Trevino MRN: [id number] DOB: 1950-07-06 Today's Date: 06/12/2021  History of Present Illness  Patient is a 71 y/o female who presents on 5/31 with SOB, Rt abdominal pain, nausea and swelling. Admitted with newly diagnosed HF.  PCI of LAD scheduled 06/13/21. CXR- PNA. s/p cardiac cath 6/2. PMH includes CAd, depression, DM, HTN, MVP, peripheral neuropathy.  Clinical Impression  Patient presents with generalized weakness, impaired balance, cognitive deficits and impaired mobility s/p above. 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 Reports a fall on the stairs but not sure when this occurred. Today, pt tolerated transfers and gait training with supervision and Min A due to balance deficits. Pt reluctantly used RW to improve safety. Pt able to state 1/3 words during STM recall. At this time, concerned about pt returning home alone due to balance deficits, weakness and cognitive deficits. Recommend pt have family support at d/c. Would benefit from HHPT to maximize independence, strength, balance and overall mobility. Will follow acutely.      Recommendations for follow up therapy are one component of a multi-disciplinary discharge planning process, led by the attending physician.  Recommendations may be updated based on patient status, additional functional criteria and insurance authorization.  Follow Up Recommendations Home health PT    Assistance Recommended at Discharge Intermittent Supervision/Assistance  Patient can return home with the following  A little help with walking and/or transfers;Assistance with cooking/housework;Direct supervision/assist for medications management;Assist for transportation;Help with stairs or ramp for entrance;A little help with bathing/dressing/bathroom    Equipment Recommendations Rolling walker (2 wheels)  Recommendations for Other  Services       Functional Status Assessment Patient has had a recent decline in their functional status and demonstrates the ability to make significant improvements in function in a reasonable and predictable amount of time.     Precautions / Restrictions Precautions Precautions: Fall Restrictions Weight Bearing Restrictions: No      Mobility  Bed Mobility Overal bed mobility: Needs Assistance Bed Mobility: Supine to Sit     Supine to sit: Supervision, HOB elevated     General bed mobility comments: No assist needed but consistent cues to mobilize needed, decreased initiation.    Transfers Overall transfer level: Needs assistance Equipment used: Rolling walker (2 wheels) Transfers: Sit to/from Stand Sit to Stand: Min guard           General transfer comment: Min guard for safety. Stood from [employer], cues for hand placement with RW right in front of her, transferred to chair post ambulation.    Ambulation/Gait Ambulation/Gait assistance: Min assist Gait Distance (Feet): 150 Feet Assistive device: Rolling walker (2 wheels) Gait Pattern/deviations: Step-through pattern, Decreased stride length, Staggering right, Staggering left, Narrow base of support Gait velocity: decreased Gait velocity interpretation: <1.31 ft/sec, indicative of household ambulator   General Gait Details: Slow, mildy unsteady gait with a few instances of stumbling mostly backwards but able to catch self and needing Min A at times, cues for RW proximity/management.  Stairs            Wheelchair Mobility    Modified Rankin (Stroke Patients Only)       Balance Overall balance assessment: Needs assistance, History of Falls Sitting-balance support: Feet supported, No upper extremity supported Sitting balance-Leahy Scale: Good     Standing balance support: During functional activity Standing balance-Leahy Scale: Fair Standing balance comment: Able to stand statically  without UE  support, needs UE support for walking                             Pertinent Vitals/Pain Pain Assessment Pain Assessment: No/denies pain    Home Living Family/patient expects to be discharged to:: Private residence Living Arrangements: Alone Available Help at Discharge: Family;Available PRN/intermittently Type of Home: House Home Access: Level entry       Home Layout: One level;Laundry or work area in [employer]: None      Prior Function Prior Level of Function : Independent/Modified Independent             Mobility Comments: Drives, works as a Programmer, applications, 1 fall reported (not sure when) ADLs Comments: independent, per chart, hx of memory issues     Hand Dominance        Extremity/Trunk Assessment   Upper Extremity Assessment Upper Extremity Assessment: Defer to OT evaluation    Lower Extremity Assessment Lower Extremity Assessment: Generalized weakness    Cervical / Trunk Assessment Cervical / Trunk Assessment: Kyphotic  Communication   Communication: No difficulties  Cognition Arousal/Alertness: Awake/alert Behavior During Therapy: WFL for tasks assessed/performed Overall Cognitive Status: Impaired/Different from baseline Area of Impairment: Memory, Safety/judgement, Problem solving, Following commands, Orientation                 Orientation Level: Disoriented to, Situation   Memory: Decreased short-term memory Following Commands: Follows one step commands with increased time (repetition) Safety/Judgement: Decreased awareness of safety   Problem Solving: Slow processing, Decreased initiation, Requires verbal cues, Requires tactile cues General Comments: knows it is June but not the year, "is it 30?" Tangential at times. Slow processing. Able to recall 1/3 words within session. Able to get other words with contextual clues/hints. Repeating self during session.        General Comments General comments (skin  integrity, edema, etc.): VSS on RA, difficulty getting pleth reading for 02. No SOB noted.    Exercises     Assessment/Plan    PT Assessment Patient needs continued PT services  PT Problem List Decreased strength;Decreased mobility;Decreased safety awareness;Decreased balance;Decreased knowledge of use of DME;Decreased activity tolerance;Decreased cognition       PT Treatment Interventions Therapeutic activities;DME instruction;Gait training;Functional mobility training;Balance training;Therapeutic exercise;Patient/family education;Stair training;Cognitive remediation    PT Goals (Current goals can be found in the Care Plan section)  Acute Rehab PT Goals Patient Stated Goal: to go home PT Goal Formulation: With patient Time For Goal Achievement: 06/26/21 Potential to Achieve Goals: Fair    Frequency Min 3X/week     Co-evaluation               AM-PAC PT "6 Clicks" Mobility  Outcome Measure Help needed turning from your back to your side while in a flat bed without using bedrails?: None Help needed moving from lying on your back to sitting on the side of a flat bed without using bedrails?: None Help needed moving to and from a bed to a chair (including a wheelchair)?: A Little Help needed standing up from a chair using your arms (e.g., wheelchair or bedside chair)?: A Little Help needed to walk in hospital room?: A Little Help needed climbing 3-5 steps with a railing? : A Lot 6 Click Score: 19    End of Session Equipment Utilized During Treatment: Gait belt Activity Tolerance: Patient tolerated treatment well Patient left: in chair;with chair alarm set;with  call bell/phone within reach Nurse Communication: Mobility status PT Visit Diagnosis: Unsteadiness on feet (R26.81);Difficulty in walking, not elsewhere classified (R26.2);Muscle weakness (generalized) (M62.81)    Time: 0929-1000 PT Time Calculation (min) (ACUTE ONLY): 31 min   Charges:   PT Evaluation $PT  Eval Moderate Complexity: 1 Mod PT Treatments $Gait Training: 8-22 mins        Zettie Cooley, DPT Acute Rehabilitation Services Secure chat preferred Office [place] 06/12/2021, 12:43 PM

## 2021-06-12 NOTE — Plan of Care (Signed)
  Problem: Education: Goal: Knowledge of General Education information will improve Description: Including pain rating scale, medication(s)/side effects and non-pharmacologic comfort measures Outcome: Progressing   Problem: Health Behavior/Discharge Planning: Goal: Ability to manage health-related needs will improve Outcome: Progressing   Problem: Clinical Measurements: Goal: Ability to maintain clinical measurements within normal limits will improve Outcome: Progressing Goal: Will remain free from infection Outcome: Progressing Goal: Diagnostic test results will improve Outcome: Progressing Goal: Respiratory complications will improve Outcome: Progressing Goal: Cardiovascular complication will be avoided Outcome: Progressing   Problem: Activity: Goal: Risk for activity intolerance will decrease Outcome: Progressing   Problem: Nutrition: Goal: Adequate nutrition will be maintained Outcome: Progressing   Problem: Coping: Goal: Level of anxiety will decrease Outcome: Progressing   Problem: Elimination: Goal: Will not experience complications related to bowel motility Outcome: Progressing Goal: Will not experience complications related to urinary retention Outcome: Progressing   Problem: Pain Managment: Goal: General experience of comfort will improve Outcome: Progressing   Problem: Safety: Goal: Ability to remain free from injury will improve Outcome: Progressing   Problem: Skin Integrity: Goal: Risk for impaired skin integrity will decrease Outcome: Progressing   Problem: Education: Goal: Ability to describe self-care measures that may prevent or decrease complications (Diabetes Survival Skills Education) will improve Outcome: Progressing Goal: Individualized Educational Video(s) Outcome: Progressing   Problem: Coping: Goal: Ability to adjust to condition or change in health will improve Outcome: Progressing   Problem: Fluid Volume: Goal: Ability to  maintain a balanced intake and output will improve Outcome: Progressing   Problem: Health Behavior/Discharge Planning: Goal: Ability to identify and utilize available resources and services will improve Outcome: Progressing Goal: Ability to manage health-related needs will improve Outcome: Progressing   Problem: Metabolic: Goal: Ability to maintain appropriate glucose levels will improve Outcome: Progressing   Problem: Nutritional: Goal: Maintenance of adequate nutrition will improve Outcome: Progressing Goal: Progress toward achieving an optimal weight will improve Outcome: Progressing   Problem: Skin Integrity: Goal: Risk for impaired skin integrity will decrease Outcome: Progressing   Problem: Tissue Perfusion: Goal: Adequacy of tissue perfusion will improve Outcome: Progressing   Problem: Education: Goal: Ability to demonstrate management of disease process will improve Outcome: Progressing Goal: Ability to verbalize understanding of medication therapies will improve Outcome: Progressing Goal: Individualized Educational Video(s) Outcome: Progressing   Problem: Activity: Goal: Capacity to carry out activities will improve Outcome: Progressing   Problem: Cardiac: Goal: Ability to achieve and maintain adequate cardiopulmonary perfusion will improve Outcome: Progressing   Problem: Education: Goal: Understanding of CV disease, CV risk reduction, and recovery process will improve Outcome: Progressing Goal: Individualized Educational Video(s) Outcome: Progressing   Problem: Activity: Goal: Ability to return to baseline activity level will improve Outcome: Progressing   Problem: Cardiovascular: Goal: Ability to achieve and maintain adequate cardiovascular perfusion will improve Outcome: Progressing Goal: Vascular access site(s) Level 0-1 will be maintained Outcome: Progressing   Problem: Health Behavior/Discharge Planning: Goal: Ability to safely manage  health-related needs after discharge will improve Outcome: Progressing   

## 2021-06-12 NOTE — Plan of Care (Signed)
  Problem: Clinical Measurements: Goal: Will remain free from infection Outcome: Progressing Goal: Respiratory complications will improve Outcome: Progressing   Problem: Activity: Goal: Risk for activity intolerance will decrease Outcome: Progressing   Problem: Coping: Goal: Level of anxiety will decrease Outcome: Progressing   Problem: Elimination: Goal: Will not experience complications related to urinary retention Outcome: Progressing   Problem: Pain Managment: Goal: General experience of comfort will improve Outcome: Progressing   Problem: Coping: Goal: Ability to adjust to condition or change in health will improve Outcome: Progressing

## 2021-06-12 NOTE — Progress Notes (Addendum)
Patient ID: Shelby Trevino, female   DOB: 11-13-1950, 71 y.o.   MRN: [id number]     Advanced Heart Failure Rounding Note  PCP-Cardiologist: None   Subjective:    cMRI 6/5  LVEF 23%, RVEF 24%, mod MR  Delayed enhancement imaging suggested minimal viability in the basal to mid inferior and inferoseptal walls. The mid to apical anteroseptal and anterior walls were borderline in terms of expected viability with revascularization, with 50% wall thickness LGE.  Denies CP. No dyspnea. No further SVT   Transitioned to PO lasix yesterday. Wt down 3 lb. SCr stable, 0.91. K 4.5   SBPs upper 90s systolic.   Laying flat in bed. Appears comfortable. No complaints today.    Objective:   Weight Range: 52.1 kg Body mass index is 17.98 kg/m.   Vital Signs:   Temp:  [98.7 F (37.1 C)-99.1 F (37.3 C)] 98.9 F (37.2 C) (06/06 0715) Pulse Rate:  [74-97] 97 (06/06 0715) Resp:  [13-20] 13 (06/06 0715) BP: (97-108)/(62-77) 98/67 (06/06 0715) SpO2:  [94 %-100 %] 94 % (06/06 0715) Weight:  [52.1 kg] 52.1 kg (06/06 0446) Last BM Date : 06/11/21  Weight change: Filed Weights   06/10/21 0414 06/11/21 0319 06/12/21 0446  Weight: 55.8 kg 53.1 kg 52.1 kg    Intake/Output:   Intake/Output Summary (Last 24 hours) at 06/12/2021 0738 Last data filed at 06/11/2021 1500 Gross per 24 hour  Intake --  Output 700 ml  Net -700 ml      Physical Exam   General:  Well appearing, elderly No respiratory difficulty HEENT: normal Neck: supple. JVD 7 cm. Carotids 2+ bilat; no bruits. No lymphadenopathy or thyromegaly appreciated. Cor: PMI nondisplaced. Regular rate & rhythm. 2/6 MR murmur  Lungs: clear Abdomen: soft, nontender, nondistended. No hepatosplenomegaly. No bruits or masses. Good bowel sounds. Extremities: no cyanosis, clubbing, rash, edema Neuro: alert & oriented x 3, cranial nerves grossly intact. moves all 4 extremities w/o difficulty. Affect pleasant.   Telemetry   NSR 90  (personally reviewed)  Labs    CBC Recent Labs    06/10/21 0055 06/11/21 0058  WBC 8.5 6.2  HGB 12.1 12.6  HCT 35.7* 37.2  MCV 85.0 84.5  PLT 346 332   Basic Metabolic Panel Recent Labs    78/29/56 1930 06/11/21 0058 06/12/21 0134  NA 136 131* 136  K 4.3 3.9 4.5  CL 94* 95* 99  CO2 28 28 28   GLUCOSE 178* 143* 110*  BUN 19 17 17   CREATININE 1.08* 1.08* 0.91  CALCIUM 9.3 8.7* 9.0  MG 2.5*  --   --    Liver Function Tests No results for input(s): AST, ALT, ALKPHOS, BILITOT, PROT, ALBUMIN in the last 72 hours.  No results for input(s): LIPASE, AMYLASE in the last 72 hours. Cardiac Enzymes No results for input(s): CKTOTAL, CKMB, CKMBINDEX, TROPONINI in the last 72 hours.  BNP: BNP (last 3 results) Recent Labs    06/06/21 1757  BNP 2,924.0*    ProBNP (last 3 results) No results for input(s): PROBNP in the last 8760 hours.   D-Dimer No results for input(s): DDIMER in the last 72 hours. Hemoglobin A1C No results for input(s): HGBA1C in the last 72 hours.  Fasting Lipid Panel No results for input(s): CHOL, HDL, LDLCALC, TRIG, CHOLHDL, LDLDIRECT in the last 72 hours. Thyroid Function Tests No results for input(s): TSH, T4TOTAL, T3FREE, THYROIDAB in the last 72 hours.  Invalid input(s): FREET3   Other results:   Imaging  MR CARDIAC MORPHOLOGY W WO CONTRAST  Result Date: 06/11/2021 CLINICAL DATA:  Ischemic cardiomyopathy, evaluate viability. EXAM: CARDIAC MRI TECHNIQUE: The patient was scanned on a 1.5 Tesla GE magnet. A dedicated cardiac coil was used. Functional imaging was done using Fiesta sequences. 2,3, and 4 chamber views were done to assess for RWMA's. Modified Simpson's rule using a short axis stack was used to calculate an ejection fraction on a dedicated work Research officer, [employer]. The patient received 8 cc of Gadavist. After 10 minutes inversion recovery sequences were used to assess for infiltration and scar tissue. FINDINGS: There is a  moderate left pleural effusion. Moderate left ventricular dilation with normal wall thickness. Septal akinesis, inferolateral and inferior hypokinesis, mid to apical anterolateral and anterior akinesis, apical akinesis. Overall LV EF 23%. Mildly dilated RV with EF 24%. Moderate left atrial enlargement, normal right atrium. Moderate mitral regurgitation. Trileaflet aortic valve with no significant stenosis or regurgitation. Delayed enhancement imaging: >50% wall thickness late gadolinium enhancement (LGE) in the basal inferior wall, <50% wall thickness LGE in the mid inferior wall. >50% wall thickness LGE in the basal to mid inferoseptal wall. About 50% wall thickness LGE in the mid anteroseptal and mid anterior walls, also in the the apical septal and apical anterior walls. MEASUREMENTS: MEASUREMENTS LVEDV 214 mL LVEDVi 135 mL/m2 LVSV 50 mL LVEF 23% RVEDV 189 mL RVEDVi 119 mL/m2 RVSV 45 mL RVEF 24% IMPRESSION: 1. Moderately dilated LV with EF 23%, wall motion abnormalities as noted above. 2.  Mildly dilated RV with EF 24%. 3. Moderate mitral regurgitation noted, flow sequences to confirm were not done. 4. Delayed enhancement imaging suggested minimal viability in the basal to mid inferior and inferoseptal walls. The mid to apical anteroseptal and anterior walls were borderline in terms of expected viability with revascularization, with 50% wall thickness LGE. Rodger Giangregorio Electronically Signed   By: Marca Ancona M.D.   On: 06/11/2021 17:58     Medications:     Scheduled Medications:  aspirin  81 mg Oral Daily   atorvastatin  80 mg Oral QHS   dapagliflozin propanediol  10 mg Oral Daily   digoxin  0.125 mg Oral Daily   enoxaparin (LOVENOX) injection  40 mg Subcutaneous Q24H   furosemide  40 mg Oral Daily   insulin aspart  0-5 Units Subcutaneous QHS   insulin aspart  0-9 Units Subcutaneous TID WC   losartan  12.5 mg Oral QHS   multivitamin with minerals  1 tablet Oral Daily   pantoprazole  40 mg  Oral Daily   sodium chloride flush  3 mL Intravenous Q12H   sodium chloride flush  3 mL Intravenous Q12H   spironolactone  25 mg Oral Daily    Infusions:  sodium chloride     sodium chloride      PRN Medications: sodium chloride, sodium chloride, acetaminophen, alum & mag hydroxide-simeth, magnesium hydroxide, nitroGLYCERIN, ondansetron (ZOFRAN) IV, sodium chloride flush, sodium chloride flush   Assessment/Plan   1. Acute on chronic systolic CHF: Ischemic cardiomyopathy.  Echo this admission with EF 20-25% with moderate RV dysfunction and moderate MR, dilated IVC.  With inferior and anterior Qs on ECG, she may have significant non-viable territory in her LV.  RHC on 6/2 showed elevated filling pressures with CI 2.22. cMRI LVEF 23%, RVEF 24%, delayed enhancement imaging suggested minimal viability in the basal to mid inferior and inferoseptal walls. The mid to apical anteroseptal and anterior walls were borderline in terms of expected viability with  revascularization. She diuresed well w/ IV Lasix. Volume stable on exam  - Continue PO Lasix 40 mg daily  - Continue digoxin 0.125 daily.  - Continue spironolactone 25 daily.  - Continue Farxiga 10 daily.  - Continue Losartan 12.5 mg qhs. BP too soft for Entresto   - Plan LAD PCI tomorrow  2. CAD: Admitted with CHF but had anginal-type symptoms as well.  HS-TnI at admission did not suggest ACS at the time of admission.  Inferior and anterior Qs suggested out of hospital MI.  Cath on 6/2 showed severe proximal to mid LAD and proximal D1 disease as well as occluded RCA with collaterals.  She has potential CABG targets.  She is a diabetic with systolic CHF.  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  There is some question about her memory. Based on viability study, plan is for LAD PCI  - Continue ASA 81 mg daily  - atorvastatin 80 mg daily.  - PCI of LAD scheduled tomorrow AM w/ Dr. Herbie Baltimore  3. Type 2 diabetes: Added [place].  4.  SVT: Patient has history of AVNRT with prior ablation. Developed SVT on 6/5.  Terminated w/ adenosine x 1  - no further recurrence, monitor on tele  - keep K > 4.0 and Mg > 2.0  Ambulate w/ CR today    Length of Stay: [address], PA-C  06/12/2021, 7:38 AM  Advanced Heart Failure Team Pager [phone] (M-F; 7a - 5p)  Please contact [facility] for night-coverage after hours (5p -7a ) and weekends on [email or web address]   Patient seen with PA, agree with the above note.   She denies dyspnea or chest pain.  Overall feeling good.  SBP 90s-100s.   General: NAD Neck: No JVD, no thyromegaly or thyroid nodule.  Lungs: Clear to auscultation bilaterally with normal respiratory effort. CV: Nondisplaced PMI.  Heart regular S1/S2, no S3/S4, no murmur.  No peripheral edema.   Abdomen: Soft, nontender, no hepatosplenomegaly, no distention.  Skin: Intact without lesions or rashes.  Neurologic: Alert and oriented x 3.  Psych: Normal affect. Extremities: No clubbing or cyanosis.  HEENT: Normal.   Reviewed cMRI with Dr. Laneta Simmers yesterday, with severe RV dysfunction, borderline viability, and some question of early dementia, we decided that she would likely be best served by PCI rather than CABG.  Will plan complex PCI to LAD system tomorrow with Dr. Herbie Baltimore.  I will go ahead and start her on Plavix.  Discussed with Dr. Herbie Baltimore and with patient.    Continue current HF meds.  She looks euvolemic, no BP room for titration.   Needs to work with PT to determine venue for discharge (home versus rehab/SNF).    Marca Ancona 06/12/2021 8:28 AM

## 2021-06-12 NOTE — H&P (View-Only) (Signed)
Patient ID: Shelby Trevino, female   DOB: 11-13-1950, 71 y.o.   MRN: [id number]     Advanced Heart Failure Rounding Note  PCP-Cardiologist: None   Subjective:    cMRI 6/5  LVEF 23%, RVEF 24%, mod MR  Delayed enhancement imaging suggested minimal viability in the basal to mid inferior and inferoseptal walls. The mid to apical anteroseptal and anterior walls were borderline in terms of expected viability with revascularization, with 50% wall thickness LGE.  Denies CP. No dyspnea. No further SVT   Transitioned to PO lasix yesterday. Wt down 3 lb. SCr stable, 0.91. K 4.5   SBPs upper 90s systolic.   Laying flat in bed. Appears comfortable. No complaints today.    Objective:   Weight Range: 52.1 kg Body mass index is 17.98 kg/m.   Vital Signs:   Temp:  [98.7 F (37.1 C)-99.1 F (37.3 C)] 98.9 F (37.2 C) (06/06 0715) Pulse Rate:  [74-97] 97 (06/06 0715) Resp:  [13-20] 13 (06/06 0715) BP: (97-108)/(62-77) 98/67 (06/06 0715) SpO2:  [94 %-100 %] 94 % (06/06 0715) Weight:  [52.1 kg] 52.1 kg (06/06 0446) Last BM Date : 06/11/21  Weight change: Filed Weights   06/10/21 0414 06/11/21 0319 06/12/21 0446  Weight: 55.8 kg 53.1 kg 52.1 kg    Intake/Output:   Intake/Output Summary (Last 24 hours) at 06/12/2021 0738 Last data filed at 06/11/2021 1500 Gross per 24 hour  Intake --  Output 700 ml  Net -700 ml      Physical Exam   General:  Well appearing, elderly No respiratory difficulty HEENT: normal Neck: supple. JVD 7 cm. Carotids 2+ bilat; no bruits. No lymphadenopathy or thyromegaly appreciated. Cor: PMI nondisplaced. Regular rate & rhythm. 2/6 MR murmur  Lungs: clear Abdomen: soft, nontender, nondistended. No hepatosplenomegaly. No bruits or masses. Good bowel sounds. Extremities: no cyanosis, clubbing, rash, edema Neuro: alert & oriented x 3, cranial nerves grossly intact. moves all 4 extremities w/o difficulty. Affect pleasant.   Telemetry   NSR 90  (personally reviewed)  Labs    CBC Recent Labs    06/10/21 0055 06/11/21 0058  WBC 8.5 6.2  HGB 12.1 12.6  HCT 35.7* 37.2  MCV 85.0 84.5  PLT 346 332   Basic Metabolic Panel Recent Labs    78/29/56 1930 06/11/21 0058 06/12/21 0134  NA 136 131* 136  K 4.3 3.9 4.5  CL 94* 95* 99  CO2 28 28 28   GLUCOSE 178* 143* 110*  BUN 19 17 17   CREATININE 1.08* 1.08* 0.91  CALCIUM 9.3 8.7* 9.0  MG 2.5*  --   --    Liver Function Tests No results for input(s): AST, ALT, ALKPHOS, BILITOT, PROT, ALBUMIN in the last 72 hours.  No results for input(s): LIPASE, AMYLASE in the last 72 hours. Cardiac Enzymes No results for input(s): CKTOTAL, CKMB, CKMBINDEX, TROPONINI in the last 72 hours.  BNP: BNP (last 3 results) Recent Labs    06/06/21 1757  BNP 2,924.0*    ProBNP (last 3 results) No results for input(s): PROBNP in the last 8760 hours.   D-Dimer No results for input(s): DDIMER in the last 72 hours. Hemoglobin A1C No results for input(s): HGBA1C in the last 72 hours.  Fasting Lipid Panel No results for input(s): CHOL, HDL, LDLCALC, TRIG, CHOLHDL, LDLDIRECT in the last 72 hours. Thyroid Function Tests No results for input(s): TSH, T4TOTAL, T3FREE, THYROIDAB in the last 72 hours.  Invalid input(s): FREET3   Other results:   Imaging  MR CARDIAC MORPHOLOGY W WO CONTRAST  Result Date: 06/11/2021 CLINICAL DATA:  Ischemic cardiomyopathy, evaluate viability. EXAM: CARDIAC MRI TECHNIQUE: The patient was scanned on a 1.5 Tesla GE magnet. A dedicated cardiac coil was used. Functional imaging was done using Fiesta sequences. 2,3, and 4 chamber views were done to assess for RWMA's. Modified Simpson's rule using a short axis stack was used to calculate an ejection fraction on a dedicated work [employer]. The patient received 8 cc of Gadavist. After 10 minutes inversion recovery sequences were used to assess for infiltration and scar tissue. FINDINGS: There is a  moderate left pleural effusion. Moderate left ventricular dilation with normal wall thickness. Septal akinesis, inferolateral and inferior hypokinesis, mid to apical anterolateral and anterior akinesis, apical akinesis. Overall LV EF 23%. Mildly dilated RV with EF 24%. Moderate left atrial enlargement, normal right atrium. Moderate mitral regurgitation. Trileaflet aortic valve with no significant stenosis or regurgitation. Delayed enhancement imaging: >50% wall thickness late gadolinium enhancement (LGE) in the basal inferior wall, <50% wall thickness LGE in the mid inferior wall. >50% wall thickness LGE in the basal to mid inferoseptal wall. About 50% wall thickness LGE in the mid anteroseptal and mid anterior walls, also in the the apical septal and apical anterior walls. MEASUREMENTS: MEASUREMENTS LVEDV 214 mL LVEDVi 135 mL/m2 LVSV 50 mL LVEF 23% RVEDV 189 mL RVEDVi 119 mL/m2 RVSV 45 mL RVEF 24% IMPRESSION: 1. Moderately dilated LV with EF 23%, wall motion abnormalities as noted above. 2.  Mildly dilated RV with EF 24%. 3. Moderate mitral regurgitation noted, flow sequences to confirm were not done. 4. Delayed enhancement imaging suggested minimal viability in the basal to mid inferior and inferoseptal walls. The mid to apical anteroseptal and anterior walls were borderline in terms of expected viability with revascularization, with 50% wall thickness LGE. Ladarrious Kirksey Electronically Signed   By: Marca Ancona M.D.   On: 06/11/2021 17:58     Medications:     Scheduled Medications:  aspirin  81 mg Oral Daily   atorvastatin  80 mg Oral QHS   dapagliflozin propanediol  10 mg Oral Daily   digoxin  0.125 mg Oral Daily   enoxaparin (LOVENOX) injection  40 mg Subcutaneous Q24H   furosemide  40 mg Oral Daily   insulin aspart  0-5 Units Subcutaneous QHS   insulin aspart  0-9 Units Subcutaneous TID WC   losartan  12.5 mg Oral QHS   multivitamin with minerals  1 tablet Oral Daily   pantoprazole  40 mg  Oral Daily   sodium chloride flush  3 mL Intravenous Q12H   sodium chloride flush  3 mL Intravenous Q12H   spironolactone  25 mg Oral Daily    Infusions:  sodium chloride     sodium chloride      PRN Medications: sodium chloride, sodium chloride, acetaminophen, alum & mag hydroxide-simeth, magnesium hydroxide, nitroGLYCERIN, ondansetron (ZOFRAN) IV, sodium chloride flush, sodium chloride flush   Assessment/Plan   1. Acute on chronic systolic CHF: Ischemic cardiomyopathy.  Echo this admission with EF 20-25% with moderate RV dysfunction and moderate MR, dilated IVC.  With inferior and anterior Qs on ECG, she may have significant non-viable territory in her LV.  RHC on 6/2 showed elevated filling pressures with CI 2.22. cMRI LVEF 23%, RVEF 24%, delayed enhancement imaging suggested minimal viability in the basal to mid inferior and inferoseptal walls. The mid to apical anteroseptal and anterior walls were borderline in terms of expected viability with  revascularization. She diuresed well w/ IV Lasix. Volume stable on exam  - Continue PO Lasix 40 mg daily  - Continue digoxin 0.125 daily.  - Continue spironolactone 25 daily.  - Continue Farxiga 10 daily.  - Continue Losartan 12.5 mg qhs. BP too soft for Entresto   - Plan LAD PCI tomorrow  2. CAD: Admitted with CHF but had anginal-type symptoms as well.  HS-TnI at admission did not suggest ACS at the time of admission.  Inferior and anterior Qs suggested out of hospital MI.  Cath on 6/2 showed severe proximal to mid LAD and proximal D1 disease as well as occluded RCA with collaterals.  She has potential CABG targets.  She is a diabetic with systolic CHF.  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  There is some question about her memory. Based on viability study, plan is for LAD PCI  - Continue ASA 81 mg daily  - atorvastatin 80 mg daily.  - PCI of LAD scheduled tomorrow AM w/ Dr. Harding  3. Type 2 diabetes: Added Farxiga.  4.  SVT: Patient has history of AVNRT with prior ablation. Developed SVT on 6/5.  Terminated w/ adenosine x 1  - no further recurrence, monitor on tele  - keep K > 4.0 and Mg > 2.0  Ambulate w/ CR today    Length of Stay: 6  Brittainy Simmons, PA-C  06/12/2021, 7:38 AM  Advanced Heart Failure Team Pager [phone] (M-F; 7a - 5p)  Please contact [facility] for night-coverage after hours (5p -7a ) and weekends on [email or web address]   Patient seen with PA, agree with the above note.   She denies dyspnea or chest pain.  Overall feeling good.  SBP 90s-100s.   General: NAD Neck: No JVD, no thyromegaly or thyroid nodule.  Lungs: Clear to auscultation bilaterally with normal respiratory effort. CV: Nondisplaced PMI.  Heart regular S1/S2, no S3/S4, no murmur.  No peripheral edema.   Abdomen: Soft, nontender, no hepatosplenomegaly, no distention.  Skin: Intact without lesions or rashes.  Neurologic: Alert and oriented x 3.  Psych: Normal affect. Extremities: No clubbing or cyanosis.  HEENT: Normal.   Reviewed cMRI with Dr. Bartle yesterday, with severe RV dysfunction, borderline viability, and some question of early dementia, we decided that she would likely be best served by PCI rather than CABG.  Will plan complex PCI to LAD system tomorrow with Dr. Harding.  I will go ahead and start her on Plavix.  Discussed with Dr. Harding and with patient.    Continue current HF meds.  She looks euvolemic, no BP room for titration.   Needs to work with PT to determine venue for discharge (home versus rehab/SNF).    Rexann Lueras 06/12/2021 8:28 AM  

## 2021-06-12 NOTE — Evaluation (Signed)
Occupational Therapy Evaluation Patient Details Name: Shelby Trevino MRN: [id number] DOB: 11/09/1950 Today's Date: 06/12/2021   History of Present Illness Patient is a 71 y/o female who presents on 5/31 with SOB, Rt abdominal pain, nausea and swelling. Admitted with newly diagnosed HF.  PCI of LAD scheduled 06/13/21. CXR- PNA. s/p cardiac cath 6/2. PMH includes CAd, depression, DM, HTN, MVP, peripheral neuropathy.   Clinical Impression   PTA pt lives independently alone and works as a PCA for an elderly client. 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 Pt required S to set up with ADL and mobility @ RW level. Pt scored an 11 on the Short Blessed Cognitive screen, which places her in the impaired category, recommending further assessment of cognition. Errors were in estimate of time (actual time 12:15; pt reports "4:00") and delayed recall (pt able to recall 1/5 words from name and address). Flat affect throughout session and reports of poor appetite. Pt discussed situational "life problems" with "car trouble" that have been very stressful and not feeling like she has support from her children. Pt also expressed feelings of concern with recent medical issues. Unsure of baseline cognition or possibility that deficits are related to symptoms of depression. May be beneficial for pt to talk with psych to screen for depression. At this time, recommend pt follow up with HHOT and have initial frequent S after DC. Acute OT to follow.  Pastoral Counseling asked to visit pt.    Recommendations for follow up therapy are one component of a multi-disciplinary discharge planning process, led by the attending physician.  Recommendations may be updated based on patient status, additional functional criteria and insurance authorization.   Follow Up Recommendations  Home health OT    Assistance Recommended at Discharge Frequent or constant Supervision/Assistance (initial frequent S; refrain from driving;  follow up with primary MD for further assessment of cognition)  Patient can return home with the following Assistance with cooking/housework;A lot of help with bathing/dressing/bathroom;Direct supervision/assist for medications management;Direct supervision/assist for financial management;Assist for transportation    Functional Status Assessment  Patient has had a recent decline in their functional status and demonstrates the ability to make significant improvements in function in a reasonable and predictable amount of time.  Equipment Recommendations  BSC/3in1    Recommendations for Other Services Other (comment) (pastoral counseling)     Precautions / Restrictions Precautions Precautions: Fall Restrictions Weight Bearing Restrictions: No      Mobility Bed Mobility Overal bed mobility: Needs Assistance Bed Mobility: Supine to Sit     Supine to sit: Supervision, HOB elevated     General bed mobility comments: No assist needed but consistent cues to mobilize needed, decreased initiation.    Transfers Overall transfer level: Needs assistance Equipment used: Rolling walker (2 wheels) Transfers: Sit to/from Stand Sit to Stand: Supervision                  Balance Overall balance assessment: Needs assistance, History of Falls Sitting-balance support: Feet supported, No upper extremity supported Sitting balance-Leahy Scale: Good     Standing balance support: During functional activity Standing balance-Leahy Scale: Fair Standing balance comment: Able to stand statically without UE support, needs UE support for walking                           ADL either performed or assessed with clinical judgement   ADL Overall ADL's : Needs assistance/impaired  Grooming: Set up   Upper Body Bathing: Set up   Lower Body Bathing: Supervison/ safety   Upper Body Dressing : Set up   Lower Body Dressing: Supervision/safety   Toilet Transfer:  Supervision/safety;Rolling walker (2 wheels)   Toileting- Clothing Manipulation and Hygiene: Supervision/safety       Functional mobility during ADLs: Supervision/safety;Rolling walker (2 wheels)       Vision         Perception     Praxis      Pertinent Vitals/Pain Pain Assessment Pain Assessment: No/denies pain     Hand Dominance Right   Extremity/Trunk Assessment Upper Extremity Assessment Upper Extremity Assessment: Generalized weakness   Lower Extremity Assessment Lower Extremity Assessment: Defer to PT evaluation   Cervical / Trunk Assessment Cervical / Trunk Assessment: Kyphotic   Communication Communication Communication: No difficulties   Cognition Arousal/Alertness: Awake/alert Behavior During Therapy: WFL for tasks assessed/performed Overall Cognitive Status: No family/caregiver present to determine baseline cognitive functioning Area of Impairment: Memory, Safety/judgement, Problem solving, Orientation                     Memory: Decreased short-term memory Following Commands: Follows one step commands consistently (repetition) Safety/Judgement: Decreased awareness of safety, Decreased awareness of deficits   Problem Solving: Slow processing General Comments: Scored an 11 on the Short Blessed Test, placing her inthe impaired category. Was 3 hours under for estimated time, missted 4/5 words on recall task     General Comments  VSS on RA    Exercises     Shoulder Instructions      Home Living Family/patient expects to be discharged to:: Private residence Living Arrangements: Alone Available Help at Discharge: Family;Available PRN/intermittently Type of Home: House Home Access: Level entry     Home Layout: One level;Laundry or work area in basement (goes to [employer] instead of going down her steps since her fall)     Bathroom Shower/Tub: Tub/shower unit;Door   Teacher, early years/pre: Yes How  Accessible: Accessible via walker Home Equipment: None          Prior Functioning/Environment Prior Level of Function : Independent/Modified Independent             Mobility Comments: Drives, works as a Water engineer, 1 fall reported months ago when she missed the last step on her basement stairs ADLs Comments: independent, per chart, hx of memory issues        OT Problem List: Decreased cognition;Decreased safety awareness;Cardiopulmonary status limiting activity;Decreased activity tolerance      OT Treatment/Interventions: Self-care/ADL training;Energy conservation;DME and/or AE instruction;Therapeutic exercise;Therapeutic activities;Cognitive remediation/compensation;Patient/family education;Balance training    OT Goals(Current goals can be found in the care plan section) Acute Rehab OT Goals Patient Stated Goal: to be independent OT Goal Formulation: With patient Time For Goal Achievement: 06/26/21 Potential to Achieve Goals: Good  OT Frequency: Min 2X/week    Co-evaluation              AM-PAC OT "6 Clicks" Daily Activity     Outcome Measure Help from another person eating meals?: None Help from another person taking care of personal grooming?: A Little Help from another person toileting, which includes using toliet, bedpan, or urinal?: A Little Help from another person bathing (including washing, rinsing, drying)?: A Little Help from another person to put on and taking off regular upper body clothing?: A Little Help from another person to put on and taking off regular lower body clothing?: A  Little 6 Click Score: 19   End of Session Equipment Utilized During Treatment: Rolling walker (2 wheels) Nurse Communication: Other (comment) (recommend pastoral counseling)  Activity Tolerance: Patient tolerated treatment well Patient left: in chair;with call bell/phone within reach;with chair alarm set  OT Visit Diagnosis: Muscle weakness (generalized)  (M62.81);Other symptoms and signs involving cognitive function                Time: 1200-1229 OT Time Calculation (min): 29 min Charges:  OT General Charges $OT Visit: 1 Visit OT Evaluation $OT Eval Moderate Complexity: 1 Mod OT Treatments $Self Care/Home Management : 8-22 mins  Luisa Dago, OT/L   Acute OT Clinical Specialist Acute Rehabilitation Services Pager [phone] Office [phone]   [facility] 06/12/2021, 1:14 PM

## 2021-06-12 NOTE — Progress Notes (Signed)
   06/12/21 1405  Clinical Encounter Type  Visited With Patient  Visit Type Initial;Spiritual support;Social support;Critical Care;Pre-op  Referral From Nurse [place], RN)  Consult/Referral To Chaplain Gelene Mink [place])  Recommendations Discouraged with Family Dynamics  Spiritual Encounters  Spiritual Needs Prayer;Emotional;Grief support   Had pleasure to meet with [name] at patient's bedside. Shelby Trevino states that she is discouraged by the lack of support by her adult children. She is afraid that they just want to put her into a nursing home and forget about her, but Shelby Trevino believes that she has a whole lot of life to be lived placed before her. Shelby Trevino holds very strong to her Shelby Trevino faith and believes that God has much more work for her to accomplish here on earth. This chaplain provided her with encouragement to share with her son, Shelby Trevino, and daughter, Shelby Trevino, that she has no desire to be placed into a nursing home and forgotten about.  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 She did share that she is experiencing transportation issues because her car recently broke down but that her son is attempting to help her resolve the issue. At [place]' request we shared in intercessory prayer for healing, strength, and strength within her family dynamic. [address] [place], Aletha Halim., [phone]

## 2021-06-12 NOTE — Progress Notes (Signed)
Mobility Specialist Progress Note    06/12/21 1358  Mobility  Activity Ambulated with assistance in hallway  Level of Assistance Contact guard assist, steadying assist  Assistive Device Front wheel walker  Distance Ambulated (ft) 180 ft  Activity Response Tolerated well  $Mobility charge 1 Mobility   Pre-Mobility: 96 HR, 106/72 BP, 95% SpO2 Post-Mobility: 88 HR, 93% SpO2]  Pt received in cahir and agreeable. No complaints on walk. Pt sighing regularly stating she couldn't believe she was in this situation and couldn't wait to go back to normal. Returned to [facility] with call bell in reach. RN aware.   [place] Mobility Specialist

## 2021-06-13 ENCOUNTER — Encounter (HOSPITAL_COMMUNITY): Payer: Self-pay | Admitting: Cardiology

## 2021-06-13 ENCOUNTER — Other Ambulatory Visit: Payer: Self-pay

## 2021-06-13 ENCOUNTER — Encounter (HOSPITAL_COMMUNITY)
Admission: EM | Disposition: A | Discharge status: Skilled Nursing Facility | Payer: Self-pay | Source: Home / Self Care | Attending: Cardiology

## 2021-06-13 DIAGNOSIS — I25110 Atherosclerotic heart disease of native coronary artery with unstable angina pectoris: Secondary | ICD-10-CM

## 2021-06-13 DIAGNOSIS — I5043 Acute on chronic combined systolic (congestive) and diastolic (congestive) heart failure: Secondary | ICD-10-CM

## 2021-06-13 DIAGNOSIS — I255 Ischemic cardiomyopathy: Secondary | ICD-10-CM

## 2021-06-13 HISTORY — PX: CORONARY ATHERECTOMY: CATH118238

## 2021-06-13 HISTORY — PX: LEFT HEART CATH AND CORONARY ANGIOGRAPHY: CATH118249

## 2021-06-13 HISTORY — PX: CORONARY STENT INTERVENTION: CATH118234

## 2021-06-13 HISTORY — PX: INTRAVASCULAR IMAGING/OCT: CATH118326

## 2021-06-13 LAB — BASIC METABOLIC PANEL
Anion gap: 9 (ref 5–15)
BUN: 16 mg/dL (ref 8–23)
CO2: 26 mmol/L (ref 22–32)
Calcium: 9 mg/dL (ref 8.9–10.3)
Chloride: 101 mmol/L (ref 98–111)
Creatinine, Ser: 0.79 mg/dL (ref 0.44–1.00)
GFR, Estimated: >60 mL/min (ref >60)
Glucose, Bld: 138 mg/dL — ABNORMAL HIGH (ref 70–99)
Potassium: 4.2 mmol/L (ref 3.5–5.1)
Sodium: 136 mmol/L (ref 135–145)

## 2021-06-13 LAB — GLUCOSE, CAPILLARY
Glucose-Capillary: 134 mg/dL — ABNORMAL HIGH (ref 70–99)
Glucose-Capillary: 156 mg/dL — ABNORMAL HIGH (ref 70–99)
Glucose-Capillary: 193 mg/dL — ABNORMAL HIGH (ref 70–99)
Glucose-Capillary: 122 mg/dL — ABNORMAL HIGH (ref 70–99)
Glucose-Capillary: 285 mg/dL — ABNORMAL HIGH (ref 70–99)

## 2021-06-13 LAB — CBC
HCT: 43.4 % (ref 36.0–46.0)
Hemoglobin: 14.2 g/dL (ref 12.0–15.0)
MCH: 27.9 pg (ref 26.0–34.0)
MCHC: 32.7 g/dL (ref 30.0–36.0)
MCV: 85.3 fL (ref 80.0–100.0)
Platelets: 400 10*3/uL (ref 150–400)
RBC: 5.09 MIL/uL (ref 3.87–5.11)
RDW: 14.1 % (ref 11.5–15.5)
WBC: 7.6 10*3/uL (ref 4.0–10.5)
nRBC: 0 % (ref 0.0–0.2)

## 2021-06-13 LAB — POCT ACTIVATED CLOTTING TIME
Activated Clotting Time: 269 seconds
Activated Clotting Time: 269 seconds
Activated Clotting Time: 293 seconds
Activated Clotting Time: 293 seconds
Activated Clotting Time: 191 seconds

## 2021-06-13 LAB — MAGNESIUM: Magnesium: 2.3 mg/dL (ref 1.7–2.4)

## 2021-06-13 LAB — DIGOXIN LEVEL: Digoxin Level: 0.6 ng/mL — ABNORMAL LOW (ref 0.8–2.0)

## 2021-06-13 SURGERY — CORONARY STENT INTERVENTION
Anesthesia: LOCAL

## 2021-06-13 MED ORDER — FENTANYL CITRATE (PF) 100 MCG/2ML IJ SOLN
INTRAMUSCULAR | Status: DC | PRN
Start: 2021-06-13 — End: 2021-06-13
  Administered 2021-06-13: 25 ug via INTRAVENOUS

## 2021-06-13 MED ORDER — SODIUM CHLORIDE 0.9 % IV SOLN: 250.0000 mL | INTRAVENOUS | Status: DC | PRN

## 2021-06-13 MED ORDER — VERAPAMIL HCL 2.5 MG/ML IV SOLN
INTRAVENOUS | Status: DC | PRN
  Administered 2021-06-13: 10 mL via INTRA_ARTERIAL

## 2021-06-13 MED ORDER — VERAPAMIL HCL 2.5 MG/ML IV SOLN
INTRAVENOUS | Status: AC
  Filled 2021-06-13: qty 2

## 2021-06-13 MED ORDER — SODIUM CHLORIDE 0.9% FLUSH: 3.0000 mL | INTRAVENOUS | Status: DC | PRN

## 2021-06-13 MED ORDER — NITROGLYCERIN 1 MG/10 ML FOR IR/CATH LAB
INTRA_ARTERIAL | Status: AC
  Filled 2021-06-13: qty 10

## 2021-06-13 MED ORDER — HEPARIN SODIUM (PORCINE) 1000 UNIT/ML IJ SOLN
INTRAMUSCULAR | Status: AC
  Filled 2021-06-13: qty 10

## 2021-06-13 MED ORDER — HEPARIN (PORCINE) IN NACL 1000-0.9 UT/500ML-% IV SOLN
INTRAVENOUS | Status: DC | PRN
  Administered 2021-06-13 (×2): 500 mL

## 2021-06-13 MED ORDER — HEPARIN SODIUM (PORCINE) 1000 UNIT/ML IJ SOLN
INTRAMUSCULAR | Status: DC | PRN
  Administered 2021-06-13 (×3): 2000 [IU] via INTRAVENOUS
  Administered 2021-06-13: 5000 [IU] via INTRAVENOUS

## 2021-06-13 MED ORDER — IOHEXOL 350 MG/ML SOLN
INTRAVENOUS | Status: DC | PRN
  Administered 2021-06-13: 120 mL

## 2021-06-13 MED ORDER — NITROGLYCERIN 1 MG/10 ML FOR IR/CATH LAB
INTRA_ARTERIAL | Status: DC | PRN
  Administered 2021-06-13: 100 ug via INTRACORONARY
  Administered 2021-06-13 (×3): 200 ug via INTRACORONARY

## 2021-06-13 MED ORDER — ENSURE ENLIVE PO LIQD
237.0000 mL | Freq: Three times a day (TID) | ORAL | Status: DC
  Administered 2021-06-13 – 2021-06-15 (×7): 237 mL via ORAL
  Filled 2021-06-13 (×2): qty 237

## 2021-06-13 MED ORDER — NOREPINEPHRINE 4 MG/250ML-% IV SOLN
INTRAVENOUS | Status: AC
  Filled 2021-06-13: qty 250

## 2021-06-13 MED ORDER — NOREPINEPHRINE BITARTRATE 1 MG/ML IV SOLN
INTRAVENOUS | Status: DC | PRN
  Administered 2021-06-13: 5 ug/min via INTRAVENOUS

## 2021-06-13 MED ORDER — LABETALOL HCL 5 MG/ML IV SOLN: 10.0000 mg | INTRAVENOUS | Status: AC | PRN

## 2021-06-13 MED ORDER — FENTANYL CITRATE (PF) 100 MCG/2ML IJ SOLN
INTRAMUSCULAR | Status: AC
  Filled 2021-06-13: qty 2

## 2021-06-13 MED ORDER — MIDAZOLAM HCL 2 MG/2ML IJ SOLN
INTRAMUSCULAR | Status: DC | PRN
  Administered 2021-06-13 (×2): .5 mg via INTRAVENOUS

## 2021-06-13 MED ORDER — HEPARIN (PORCINE) IN NACL 1000-0.9 UT/500ML-% IV SOLN
INTRAVENOUS | Status: AC
  Filled 2021-06-13: qty 1000

## 2021-06-13 MED ORDER — MIDAZOLAM HCL 2 MG/2ML IJ SOLN
INTRAMUSCULAR | Status: AC
  Filled 2021-06-13: qty 2

## 2021-06-13 MED ORDER — SODIUM CHLORIDE 0.9 % IV SOLN: INTRAVENOUS | Status: AC

## 2021-06-13 MED ORDER — SODIUM CHLORIDE 0.9% FLUSH
3.0000 mL | Freq: Two times a day (BID) | INTRAVENOUS | Status: DC
  Administered 2021-06-13 – 2021-06-15 (×4): 3 mL via INTRAVENOUS

## 2021-06-13 MED ORDER — LIDOCAINE HCL (PF) 1 % IJ SOLN
INTRAMUSCULAR | Status: AC
  Filled 2021-06-13: qty 30

## 2021-06-13 MED ORDER — HYDRALAZINE HCL 20 MG/ML IJ SOLN: 10.0000 mg | INTRAMUSCULAR | Status: AC | PRN

## 2021-06-13 MED ORDER — LIDOCAINE HCL (PF) 1 % IJ SOLN
INTRAMUSCULAR | Status: DC | PRN
  Administered 2021-06-13: 2 mL
  Administered 2021-06-13: 15 mL

## 2021-06-13 SURGICAL SUPPLY — 23 items
BALLN SAPPHIRE 2.5X15 (BALLOONS) ×2
BALLOON SAPPHIRE 2.5X15 (BALLOONS) IMPLANT
BAND ZEPHYR COMPRESS 30 LONG (HEMOSTASIS) ×1 IMPLANT
CATH DRAGONFLY OPTIS 2.7FR (CATHETERS) ×1 IMPLANT
CATH INFINITI JR4 5F (CATHETERS) ×1 IMPLANT
CATH TELEPORT (CATHETERS) ×1 IMPLANT
CATH VISTA GUIDE 6FR XBLAD3.5 (CATHETERS) ×1 IMPLANT
CROWN DIAMONDBACK CLASSIC 1.25 (BURR) ×1 IMPLANT
ELECT DEFIB PAD ADLT CADENCE (PAD) ×1 IMPLANT
GLIDESHEATH SLEND SS 6F .021 (SHEATH) ×1 IMPLANT
GUIDEWIRE INQWIRE 1.5J.035X260 (WIRE) IMPLANT
INQWIRE 1.5J .035X260CM (WIRE) ×2
KIT ENCORE 26 ADVANTAGE (KITS) ×1 IMPLANT
KIT HEART LEFT (KITS) ×2 IMPLANT
LUBRICANT VIPERSLIDE CORONARY (MISCELLANEOUS) ×1 IMPLANT
PACK CARDIAC CATHETERIZATION (CUSTOM PROCEDURE TRAY) ×2 IMPLANT
SHEATH PINNACLE 6F 10CM (SHEATH) ×1 IMPLANT
STENT SYNERGY XD 3.0X28 (Permanent Stent) IMPLANT
SYNERGY XD 3.0X28 (Permanent Stent) ×2 IMPLANT
TRANSDUCER W/STOPCOCK (MISCELLANEOUS) ×2 IMPLANT
TUBING CIL FLEX 10 FLL-RA (TUBING) ×2 IMPLANT
WIRE RUNTHROUGH .014X300CM (WIRE) ×1 IMPLANT
WIRE VIPERWIRE COR FLEX .012 (WIRE) ×1 IMPLANT

## 2021-06-13 NOTE — Progress Notes (Signed)
   06/13/21 0840  Clinical Encounter Type  Visited With Patient not available;Health care provider  Visit Type Follow-up;Pre-op  Referral From Chaplain De Burrs - Night shift)  Consult/Referral To Chaplain Melvenia Beam)  Recommendations Patient in [place] to earlier page sent to night-shift. Patient has already gone to [facility]. [address] [place], [place]., [phone].

## 2021-06-13 NOTE — Progress Notes (Signed)
Nutrition Follow-up  DOCUMENTATION CODES:   Non-severe (moderate) malnutrition in context of chronic illness  INTERVENTION:   - Ensure Enlive po TID, each supplement provides 350 kcal and 20 grams of protein  - Once diet advanced, recommend REGULAR diet order given degree of malnutrition and decreased PO intake   - MVI with minerals daily   - Encourage PO intake  NUTRITION DIAGNOSIS:   Moderate Malnutrition related to chronic illness as evidenced by mild fat depletion, moderate muscle depletion.  Ongoing, being addressed via oral nutrition supplements  GOAL:   Patient will meet greater than or equal to 90% of their needs  Progressing  MONITOR:   PO intake, Supplement acceptance, Labs, Weight trends, I & O's  REASON FOR ASSESSMENT:   Malnutrition Screening Tool    ASSESSMENT:   71 year old female who presented to the ED on 5/31 with SOB. PMH of CAD, HTN, HLD, T2DM, GERD, depression. Pt admitted with acute heart failure syndrome.  06/02 - s/p right/left heart cath  Pt with new diagnosis class IV acute combined systolic and diastolic CHF. Pt in cath lab today for complex PCI to LAD system. Pt unavailable at time of RD visit.  Weight is down ~12 kg compared to admit weight. Pt is net negative 7.5 L since admission. Suspect weight loss is related to diuresis. Will continue to monitor weights.  RD had ordered Ensure Enlive TID at time of Initial Nutrition Assessment which has been discontinued. Pt was willing to consume these supplements so RD to reorder.  Admit weight: 63.7 kg Current weight: 51.8 kg  Meal Completion: 20-100%  Medications reviewed and include: farxiga, lasix, SSI, MVI with minerals, protonix, spironolactone  Labs reviewed. CBG's: 134-193 x 24 hours  UOP: 1425 ml x 24 hours I/O's: -7.5 L since admit  Diet Order:   Diet Order             Diet NPO time specified Except for: Sips with Meds  Diet effective midnight                    EDUCATION NEEDS:   Education needs have been addressed  Skin:  Skin Assessment: Reviewed RN Assessment  Last BM:  06/11/21  Height:   Ht Readings from Last 1 Encounters:  06/06/21 5\' 7"  (1.702 m)    Weight:   Wt Readings from Last 1 Encounters:  06/13/21 51.8 kg    BMI:  Body mass index is 17.89 kg/m.  Estimated Nutritional Needs:   Kcal:  1700-1900  Protein:  80-95 grams  Fluid:  1.7-1.9 L    Gustavus Bryant, MS, RD, LDN Inpatient Clinical Dietitian Please see [facility] for contact information.

## 2021-06-13 NOTE — Plan of Care (Signed)
  Problem: Clinical Measurements: Goal: Respiratory complications will improve Outcome: Progressing   Problem: Coping: Goal: Level of anxiety will decrease Outcome: Progressing   Problem: Elimination: Goal: Will not experience complications related to urinary retention Outcome: Progressing   Problem: Pain Managment: Goal: General experience of comfort will improve Outcome: Progressing   Problem: Coping: Goal: Ability to adjust to condition or change in health will improve Outcome: Progressing   Problem: Nutrition: Goal: Adequate nutrition will be maintained Outcome: Not Progressing

## 2021-06-13 NOTE — Progress Notes (Signed)
   06/13/21 1500  Clinical Encounter Type  Visited With Patient not available;Health care provider  Visit Type Follow-up;Post-op  Referral From Nurse Jae Dire, RN)  Consult/Referral To Chaplain Melvenia Beam)  Recommendations Patient Resting.   [address] [place], [place] Poot., [phone]

## 2021-06-13 NOTE — Interval H&P Note (Signed)
History and Physical Interval Note:  06/13/2021 7:45 AM  Shelby Trevino  has presented today for surgery, with the diagnosis of cad / ICM.  The various methods of treatment have been discussed with the patient and family. After consideration of risks, benefits and other options for treatment, the patient has consented to  Procedure(s): CORONARY STENT INTERVENTION (N/A) as a surgical intervention.  The patient's history has been reviewed, patient examined, no change in status, stable for surgery.  I have reviewed the patient's chart and labs.  Questions were answered to the patient's satisfaction.    Cath Lab Visit (complete for each Cath Lab visit)  Clinical Evaluation Leading to the Procedure:   ACS: No.  Non-ACS:    Anginal Classification: CCS III  Anti-ischemic medical therapy: Maximal Therapy (2 or more classes of medications)  Non-Invasive Test Results: High-risk stress test findings: cardiac mortality >3%/year  Prior CABG: No previous CABG   Glenetta Hew

## 2021-06-14 ENCOUNTER — Other Ambulatory Visit (HOSPITAL_COMMUNITY): Payer: Self-pay

## 2021-06-14 DIAGNOSIS — I5023 Acute on chronic systolic (congestive) heart failure: Secondary | ICD-10-CM | POA: Diagnosis not present

## 2021-06-14 DIAGNOSIS — I249 Acute ischemic heart disease, unspecified: Secondary | ICD-10-CM | POA: Diagnosis not present

## 2021-06-14 LAB — BASIC METABOLIC PANEL
Anion gap: 11 (ref 5–15)
BUN: 21 mg/dL (ref 8–23)
CO2: 22 mmol/L (ref 22–32)
Calcium: 8.8 mg/dL — ABNORMAL LOW (ref 8.9–10.3)
Chloride: 102 mmol/L (ref 98–111)
Creatinine, Ser: 0.81 mg/dL (ref 0.44–1.00)
GFR, Estimated: >60 mL/min (ref >60)
Glucose, Bld: 148 mg/dL — ABNORMAL HIGH (ref 70–99)
Potassium: 4.6 mmol/L (ref 3.5–5.1)
Sodium: 135 mmol/L (ref 135–145)

## 2021-06-14 LAB — CBC
HCT: 39 % (ref 36.0–46.0)
Hemoglobin: 13.1 g/dL (ref 12.0–15.0)
MCH: 28.9 pg (ref 26.0–34.0)
MCHC: 33.6 g/dL (ref 30.0–36.0)
MCV: 86.1 fL (ref 80.0–100.0)
Platelets: 414 10*3/uL — ABNORMAL HIGH (ref 150–400)
RBC: 4.53 MIL/uL (ref 3.87–5.11)
RDW: 14.1 % (ref 11.5–15.5)
WBC: 6.6 10*3/uL (ref 4.0–10.5)
nRBC: 0 % (ref 0.0–0.2)

## 2021-06-14 LAB — GLUCOSE, CAPILLARY
Glucose-Capillary: 140 mg/dL — ABNORMAL HIGH (ref 70–99)
Glucose-Capillary: 152 mg/dL — ABNORMAL HIGH (ref 70–99)
Glucose-Capillary: 238 mg/dL — ABNORMAL HIGH (ref 70–99)
Glucose-Capillary: 173 mg/dL — ABNORMAL HIGH (ref 70–99)

## 2021-06-14 MED ORDER — METFORMIN HCL ER 500 MG PO TB24: 500.0000 mg | ORAL_TABLET | Freq: Two times a day (BID) | ORAL | Status: AC

## 2021-06-14 MED ORDER — NITROGLYCERIN 0.4 MG SL SUBL
0.4000 mg | SUBLINGUAL_TABLET | SUBLINGUAL | 2 refills | Status: DC | PRN
  Filled 2021-06-14: qty 25, 14d supply, fill #0

## 2021-06-14 MED ORDER — SPIRONOLACTONE 25 MG PO TABS
25.0000 mg | ORAL_TABLET | Freq: Every day | ORAL | 5 refills | Status: DC
  Filled 2021-06-14: qty 30, 30d supply, fill #0

## 2021-06-14 MED ORDER — ATORVASTATIN CALCIUM 80 MG PO TABS
80.0000 mg | ORAL_TABLET | Freq: Every day | ORAL | 5 refills | Status: DC
  Filled 2021-06-14: qty 30, 30d supply, fill #0

## 2021-06-14 MED ORDER — FUROSEMIDE 40 MG PO TABS
40.0000 mg | ORAL_TABLET | Freq: Every day | ORAL | 5 refills | Status: DC
  Filled 2021-06-14: qty 30, 30d supply, fill #0

## 2021-06-14 MED ORDER — CLOPIDOGREL BISULFATE 75 MG PO TABS
75.0000 mg | ORAL_TABLET | Freq: Every day | ORAL | 11 refills | Status: DC
  Filled 2021-06-14: qty 30, 30d supply, fill #0

## 2021-06-14 MED ORDER — DIGOXIN 125 MCG PO TABS
0.1250 mg | ORAL_TABLET | Freq: Every day | ORAL | 5 refills | Status: DC
  Filled 2021-06-14: qty 30, 30d supply, fill #0

## 2021-06-14 MED ORDER — LOSARTAN POTASSIUM 25 MG PO TABS
12.5000 mg | ORAL_TABLET | Freq: Every day | ORAL | 5 refills | Status: DC
  Filled 2021-06-14: qty 15, 30d supply, fill #0

## 2021-06-14 MED ORDER — METOPROLOL SUCCINATE ER 25 MG PO TB24
12.5000 mg | ORAL_TABLET | Freq: Every day | ORAL | 5 refills | Status: DC
  Filled 2021-06-14: qty 15, 30d supply, fill #0

## 2021-06-14 MED ORDER — PANTOPRAZOLE SODIUM 40 MG PO TBEC
40.0000 mg | DELAYED_RELEASE_TABLET | Freq: Every day | ORAL | 3 refills | Status: DC
  Filled 2021-06-14: qty 30, 30d supply, fill #0

## 2021-06-14 MED ORDER — DAPAGLIFLOZIN PROPANEDIOL 10 MG PO TABS
10.0000 mg | ORAL_TABLET | Freq: Every day | ORAL | 5 refills | Status: DC
  Filled 2021-06-14: qty 30, 30d supply, fill #0

## 2021-06-14 MED ORDER — METOPROLOL SUCCINATE ER 25 MG PO TB24
12.5000 mg | ORAL_TABLET | Freq: Every day | ORAL | Status: DC
  Administered 2021-06-14 – 2021-06-15 (×2): 12.5 mg via ORAL
  Filled 2021-06-14 (×2): qty 1

## 2021-06-14 MED ORDER — ASPIRIN 81 MG PO TBEC
81.0000 mg | DELAYED_RELEASE_TABLET | Freq: Every day | ORAL | 11 refills | Status: DC
  Filled 2021-06-14: qty 30, 30d supply, fill #0

## 2021-06-14 NOTE — Progress Notes (Signed)
CARDIAC REHAB PHASE I   PRE:  Rate/Rhythm: 96 SR    BP: sitting 102/79    SaO2: 95 RA  MODE:  Ambulation: 170 ft   POST:  Rate/Rhythm: 105 ST    BP: sitting 114/88     SaO2: 96 RA  Pt stood with min assist, began walking with RW. Sudden incontinence of urine so urgently to [facility]. Able to stand and be cleaned up by staff (slight blood smear only from vagina, not rectum) then ambulated with RW in hall, slow pace. Seemed fatigued but pt sts she is fine. Not conversant but answers questions. All are still puzzled by the toy like donut she passed in [facility] earlier today. She has no comment.  To recliner. Gave HF booklet and stent card, encouraged her to read. Flat affect.  [id number]  Ethelda Chick CES, ACSM 06/14/2021 2:08 PM

## 2021-06-14 NOTE — Progress Notes (Signed)
Patient ID: Shelby Trevino, female   DOB: [birth year], 71 y.o.   MRN: [id number]     Advanced Heart Failure Rounding Note  PCP-Cardiologist: None   Subjective:    cMRI 6/5  LVEF 23%, RVEF 24%, mod MR.  Delayed enhancement imaging suggested minimal viability in the basal to mid inferior and inferoseptal walls. The mid to apical anteroseptal and anterior walls were borderline in terms of expected viability with revascularization, with 50% wall thickness LGE.  PCI to proximal LAD on 6/7.   Denies CP. No dyspnea. No further SVT.    Objective:   Weight Range: 52.8 kg Body mass index is 18.23 kg/m.   Vital Signs:   Temp:  [97.6 F (36.4 C)-99.3 F (37.4 C)] 98.6 F (37 C) (06/08 0733) Pulse Rate:  [71-99] 96 (06/08 0733) Resp:  [12-37] 17 (06/08 0733) BP: (95-143)/(62-96) 124/81 (06/08 0733) SpO2:  [93 %-100 %] 94 % (06/08 0733) Weight:  [52.8 kg] 52.8 kg (06/08 0500) Last BM Date : 06/11/21  Weight change: Filed Weights   06/12/21 0446 06/13/21 0418 06/14/21 0500  Weight: 52.1 kg 51.8 kg 52.8 kg    Intake/Output:   Intake/Output Summary (Last 24 hours) at 06/14/2021 0738 Last data filed at 06/14/2021 0600 Gross per 24 hour  Intake 722.96 ml  Output 1275 ml  Net -552.04 ml      Physical Exam   General: NAD Neck: No JVD, no thyromegaly or thyroid nodule.  Lungs: Clear to auscultation bilaterally with normal respiratory effort. CV: Nondisplaced PMI.  Heart regular S1/S2, no S3/S4, no murmur.  No peripheral edema.   Abdomen: Soft, nontender, no hepatosplenomegaly, no distention.  Skin: Intact without lesions or rashes.  Neurologic: Alert and oriented x 3.  Psych: Flat affect. Extremities: No clubbing or cyanosis.  HEENT: Normal.   Telemetry   NSR 90 (personally reviewed)  Labs    CBC Recent Labs    06/13/21 1112 06/14/21 0143  WBC 7.6 6.6  HGB 14.2 13.1  HCT 43.4 39.0  MCV 85.3 86.1  PLT 400 414*   Basic Metabolic Panel Recent Labs     06/13/21 0105 06/14/21 0143  NA 136 135  K 4.2 4.6  CL 101 102  CO2 26 22  GLUCOSE 138* 148*  BUN 16 21  CREATININE 0.79 0.81  CALCIUM 9.0 8.8*  MG 2.3  --    Liver Function Tests No results for input(s): "AST", "ALT", "ALKPHOS", "BILITOT", "PROT", "ALBUMIN" in the last 72 hours.  No results for input(s): "LIPASE", "AMYLASE" in the last 72 hours. Cardiac Enzymes No results for input(s): "CKTOTAL", "CKMB", "CKMBINDEX", "TROPONINI" in the last 72 hours.  BNP: BNP (last 3 results) Recent Labs    06/06/21 1757  BNP 2,924.0*    ProBNP (last 3 results) No results for input(s): "PROBNP" in the last 8760 hours.   D-Dimer No results for input(s): "DDIMER" in the last 72 hours. Hemoglobin A1C No results for input(s): "HGBA1C" in the last 72 hours.  Fasting Lipid Panel No results for input(s): "CHOL", "HDL", "LDLCALC", "TRIG", "CHOLHDL", "LDLDIRECT" in the last 72 hours. Thyroid Function Tests No results for input(s): "TSH", "T4TOTAL", "T3FREE", "THYROIDAB" in the last 72 hours.  Invalid input(s): "FREET3"   Other results:   Imaging    CARDIAC CATHETERIZATION  Result Date: 06/13/2021   Prox LAD lesion is 55% stenosed with 95% stenosed side branch in 1st Diag.   -------------------------------------------   Culprit Lesion Segment: Mid LAD-1 lesion is 90% stenosed.  Mid LAD-2  lesion is 95% stenosed.   Orbital atherectomy followed by balloon angioplasty was performed on both lesions   Balloon angioplasty was performed on both lesion sites using a BALLN SAPPHIRE 2.5X15.=> Followed by OCT imaging   A drug-eluting stent was successfully placed covering both lesions (careful not to cover the more proximal 55% lesion-or approach 3rdDiag/distal LAD lesion) using a SYNERGY XD 3.0X28 -> deployed to 3.2 mm   Post intervention, there is a 0% residual stenosis.   -------------------------------------------   Dist LAD lesion is 65% stenosed.   Prox RCA lesion is 100% stenosed -PDA fills via  septal collaterals.   LV end diastolic pressure is mildly elevated. Successful Orbital Atherectomy and OCT guided DES PCI of proximal to mid LAD covering 2 major lesions with a Synergy DES 3.0 mm x 28 mm deployed to 3.2 mm Recommendations Return to nursing unit for ongoing care Zephyr band removal per protocol Femoral venous sheath removal based on ACT Gentle hydration Continue management by heart failure team-Dr. Shirlee Latch Dr. Shirlee Latch and patient's son both updated on the case. Bryan Lemma, MD    Medications:     Scheduled Medications:  aspirin  81 mg Oral Daily   atorvastatin  80 mg Oral QHS   clopidogrel  75 mg Oral Daily   dapagliflozin propanediol  10 mg Oral Daily   digoxin  0.125 mg Oral Daily   enoxaparin (LOVENOX) injection  40 mg Subcutaneous Q24H   feeding supplement  237 mL Oral TID BM   furosemide  40 mg Oral Daily   insulin aspart  0-5 Units Subcutaneous QHS   insulin aspart  0-9 Units Subcutaneous TID WC   losartan  12.5 mg Oral QHS   metoprolol succinate  12.5 mg Oral Daily   multivitamin with minerals  1 tablet Oral Daily   pantoprazole  40 mg Oral Daily   sodium chloride flush  3 mL Intravenous Q12H   sodium chloride flush  3 mL Intravenous Q12H   sodium chloride flush  3 mL Intravenous Q12H   sodium chloride flush  3 mL Intravenous Q12H   spironolactone  25 mg Oral Daily    Infusions:  sodium chloride     sodium chloride     sodium chloride      PRN Medications: sodium chloride, sodium chloride, sodium chloride, acetaminophen, alum & mag hydroxide-simeth, magnesium hydroxide, nitroGLYCERIN, ondansetron (ZOFRAN) IV, sodium chloride flush, sodium chloride flush, sodium chloride flush   Assessment/Plan   1. Acute on chronic systolic CHF: Ischemic cardiomyopathy.  Echo this admission with EF 20-25% with moderate RV dysfunction and moderate MR, dilated IVC.  With inferior and anterior Qs on ECG, she may have significant non-viable territory in her LV.  RHC on 6/2  showed elevated filling pressures with CI 2.22. cMRI LVEF 23%, RVEF 24%, delayed enhancement imaging suggested minimal viability in the basal to mid inferior and inferoseptal walls. The mid to apical anteroseptal and anterior walls were borderline in terms of expected viability with revascularization. She diuresed well w/ IV Lasix. Volume stable on exam  - Continue PO Lasix 40 mg daily  - Continue digoxin 0.125 daily.  - Continue spironolactone 25 daily.  - Continue Farxiga 10 daily.  - Continue Losartan 12.5 mg qhs. BP too soft for Entresto   - Add Toprol XL 12.5 mg daily.  2. CAD: Admitted with CHF but had anginal-type symptoms as well.  HS-TnI at admission did not suggest ACS at the time of admission.  Inferior and anterior Qs  suggested out of hospital MI.  Cath on 6/2 showed severe proximal to mid LAD and proximal D1 disease as well as occluded RCA with collaterals.  She has potential CABG targets.  She is a diabetic with systolic CHF.  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  There is some question about her memory. Reviewed cMRI with Dr. Laneta Simmers, with severe RV dysfunction, borderline viability, and some question of early dementia, we decided that she would likely be best served by PCI rather than CABG.  On 6/7, she had PCI to LAD.  - Continue ASA 81 mg daily  - atorvastatin 80 mg daily.  - Continue Plavix 75 daily.  3. Type 2 diabetes: Added [place].  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 Developed SVT on 6/5.  Terminated w/ adenosine x 1  - no further recurrence, monitor on tele  - keep K > 4.0 and Mg > 2.0  She was seen by PT, recommended home health/PT.  Will have them reassess her today.  If conclusion is the same, think she could go home today.  Needs close followup in CHF clinic.  Meds for home: Plavix 75 daily, ASA 81 daily, atorvastatin 80 daily, Lasix 40 daily, spironolactone 25 daily, digoxin 0.125 daily, Farxiga 10 daily, losartan 12.5 daily, Toprol  XL 12.5 daily.    Length of Stay: 8  Marca Ancona, MD  06/14/2021, 7:38 AM  Advanced Heart Failure Team Pager [phone] (M-F; 7a - 5p)  Please contact [facility] for night-coverage after hours (5p -7a ) and weekends on [email or web address]

## 2021-06-14 NOTE — TOC Progression Note (Addendum)
Transition of Care [facility]) - Progression Note    Patient Details  Name: Shelby Trevino MRN: [id number] Date of Birth: 05-06-1950  Transition of Care [facility]) CM/SW Contact  Basheer Molchan, LCSW Phone Number: 06/14/2021, 2:59 PM  Clinical Narrative:    HF CSW spoke with the patient's son, Shelby Trevino [phone] regarding the need for SNF at time of discharge. Shelby Trevino reported that his mom lives alone in [place] and that he and his wife and sister all live in [place], [place] and would like his mother to go to [facility] rehab as his preference for SNF. Shelby Trevino at [facility] rehab, SNF has extended a bed offer and family made aware.  PASRR screen currently running. Awaiting a PASRR number to give to [facility] rehab, SNF before [name] can discharge. Once PASRR comes back FL2 will need to be updated and resigned by the MD. Awaiting response from [facility] for the PASRR number. PASRR received and FL2 updated.  [name] can discharge tomorrow morning to [facility] rehab, SNF.  CSW will continue to follow throughout discharge.     Expected Discharge Plan: Home w Home Health Services Barriers to Discharge: Continued Medical Work up  Expected Discharge Plan and Services Expected Discharge Plan: Home w Home Health Services In-house Referral: NA Discharge Planning Services: CM Consult Post Acute Care Choice: Home Health Living arrangements for the past 2 months: Single Family Home                 DME Arranged: N/A         HH Arranged: RN, PT HH Agency: [facility] Date HH Agency Contacted: 06/11/21 Time HH Agency Contacted: [phone] Representative spoke with at [facility] Agency: Amy Hyatt   Social Determinants of Health (SDOH) Interventions    Readmission Risk Interventions    06/07/2021    2:36 PM  Readmission Risk Prevention Plan  Post Dischage Appt Complete  Transportation Screening Complete

## 2021-06-14 NOTE — Progress Notes (Signed)
RE: Shelby Trevino. Massey Date of Birth: 1950/06/26 Date: 06/14/2021  Please be advised that the above-named patient will require a short-term nursing home stay - anticipated 30 days or less for rehabilitation and strengthening.  The plan is for return home.

## 2021-06-14 NOTE — Progress Notes (Signed)
Physical Therapy Treatment Patient Details Name: Shelby Trevino MRN: [id number] DOB: [birth year] Today's Date: 06/14/2021   History of Present Illness Patient is a 71 y/o female who presents on 5/31 with SOB, Rt abdominal pain, nausea and swelling. Admitted with newly diagnosed HF. PCI to proximal LAD on 6/7. CXR- PNA. s/p cardiac cath 6/2. PMH includes CAd, depression, DM, HTN, MVP, peripheral neuropathy.    PT Comments    Pt remains limited in safety and mobility by her deficits in cognition and balance, often displaying a LOB when trying to ambulate without UE support. Pt also with trunk sway and very slow gait speed when using a RW, varying from needing min guard-minA for safety/stability. Pt was unable to duel-task or change head positions while ambulating without LOB or slowing/stopping gait. She is at high risk for falls and it would be unsafe for her to be home alone. Treatment team coordinated with pt's family, who reported they could not provide the level of care pt currently needs, thus updating d/c recs to SNF. See General Comments in regards to unusual situation with BM today, MD and RN made aware. Will continue to follow acutely.     Recommendations for follow up therapy are one component of a multi-disciplinary discharge planning process, led by the attending physician.  Recommendations may be updated based on patient status, additional functional criteria and insurance authorization.  Follow Up Recommendations  Skilled nursing-short term rehab (<3 hours/day)     Assistance Recommended at Discharge Frequent or constant Supervision/Assistance  Patient can return home with the following A little help with walking and/or transfers;Assistance with cooking/housework;Direct supervision/assist for medications management;Assist for transportation;Help with stairs or ramp for entrance;A little help with bathing/dressing/bathroom;Direct supervision/assist for financial management    Equipment Recommendations  Rolling walker (2 wheels);BSC/3in1    Recommendations for Other Services       Precautions / Restrictions Precautions Precautions: Fall Restrictions Weight Bearing Restrictions: No     Mobility  Bed Mobility Overal bed mobility: Needs Assistance Bed Mobility: Supine to Sit     Supine to sit: Supervision, HOB elevated     General bed mobility comments: No assist needed but consistent cues to mobilize needed, decreased initiation.    Transfers Overall transfer level: Needs assistance Equipment used: Rolling walker (2 wheels), None Transfers: Sit to/from Stand Sit to Stand: Min guard           General transfer comment: Pt coming to stand from EOB to no AD with min guard for safety as pt with trunk sway. Min guard to stand from commode to RW    Ambulation/Gait Ambulation/Gait assistance: Min assist Gait Distance (Feet): 190 Feet (x2 bouts of ~190 ft > ~15 ft) Assistive device: Rolling walker (2 wheels), None Gait Pattern/deviations: Step-through pattern, Decreased stride length, Staggering right, Staggering left, Narrow base of support Gait velocity: reduced Gait velocity interpretation: <1.31 ft/sec, indicative of household ambulator   General Gait Details: Pt ambulating ~1/2 the way without an AD and then ~1/2 the way with the RW. Pt cued to count backwards by 3s while ambulating, but was unable to, stating "26, 25, 26, 25, 25" repeatedly. Able to count backwards by 2s with increased time and slowing of gait, with or without AD. Pt unable to continue ambulating while changing head position without AD, but continues ambulating at a slower pace when using RW. Pt with LOB bouts without AD, needing minA for stability. Intermittent minA for stability and safety with RW, otherwise min guard.  Stairs             Wheelchair Mobility    Modified Rankin (Stroke Patients Only)       Balance Overall balance assessment: Needs assistance,  History of Falls Sitting-balance support: Feet supported, No upper extremity supported Sitting balance-Leahy Scale: Good     Standing balance support: No upper extremity supported, Bilateral upper extremity supported, During functional activity Standing balance-Leahy Scale: Fair Standing balance comment: Able to stand without UE support, but has trunk sway needing intermittent minA. MinA for gait without UE support. Benefits from RW                            Cognition Arousal/Alertness: Awake/alert Behavior During Therapy: WFL for tasks assessed/performed Overall Cognitive Status: Impaired/Different from baseline Area of Impairment: Memory, Safety/judgement, Problem solving, Following commands, Attention, Awareness                   Current Attention Level: Sustained Memory: Decreased short-term memory Following Commands: Follows one step commands with increased time (repetition) Safety/Judgement: Decreased awareness of safety, Decreased awareness of deficits Awareness: Emergent Problem Solving: Slow processing, Decreased initiation, Requires verbal cues, Requires tactile cues General Comments: Pt is able to identify the correct date today. However, very easily distracted and has difficulty duel-tasking. Pt cued to count backwards by 3s while ambulating, but was unable to, stating "26, 25, 26, 25, 25" repeatedly. Pt unable to identify the "doughnut" that passed and was present in the commode with a BM, stating "oh my doughnut fell out", but when asked what it was she was unable to tell us. When asked if someone placed something up her rectum for medical purposes, pt stated "oh people put stuff up there all the time". The object was a hard/solid doughnut shaped light pink object with a fairly large girth. Notified RN and MD.        Exercises      General Comments General comments (skin integrity, edema, etc.): Noted a plastic hard/solid light pink doughnut-shaped  object that passed with a strained BM from pt along with bright red blood on a couple of her feces pieces, notified RN and MD. Pt unable to explain where the plastic piece came from.      Pertinent Vitals/Pain Pain Assessment Pain Assessment: Faces Faces Pain Scale: Hurts even more Pain Location: with trying to have BM Pain Descriptors / Indicators: Moaning, Discomfort, Grimacing Pain Intervention(s): Limited activity within patient's tolerance, Monitored during session, Repositioned    Home Living                          Prior Function            PT Goals (current goals can now be found in the care plan section) Acute Rehab PT Goals Patient Stated Goal: to get back to her normal. PT Goal Formulation: With patient Time For Goal Achievement: 06/26/21 Potential to Achieve Goals: Fair Progress towards PT goals: Progressing toward goals    Frequency    Min 3X/week      PT Plan Discharge plan needs to be updated    Co-evaluation              AM-PAC PT "6 Clicks" Mobility   Outcome Measure  Help needed turning from your back to your side while in a flat bed without using bedrails?: None Help needed moving from lying on your back  to sitting on the side of a flat bed without using bedrails?: A Little Help needed moving to and from a bed to a chair (including a wheelchair)?: A Little Help needed standing up from a chair using your arms (e.g., wheelchair or bedside chair)?: A Little Help needed to walk in hospital room?: A Little Help needed climbing 3-5 steps with a railing? : A Lot 6 Click Score: 18    End of Session Equipment Utilized During Treatment: Gait belt Activity Tolerance: Patient tolerated treatment well Patient left: in chair;with call bell/phone within reach;with chair alarm set Nurse Communication: Mobility status;Other (comment) (plastic doughnut with BM) PT Visit Diagnosis: Unsteadiness on feet (R26.81);Difficulty in walking, not  elsewhere classified (R26.2);Muscle weakness (generalized) (M62.81);Other abnormalities of gait and mobility (R26.89)     Time: 2956-2130 PT Time Calculation (min) (ACUTE ONLY): 36 min  Charges:  $Gait Training: 8-22 mins $Therapeutic Activity: 8-22 mins                     Raymond Gurney, PT, DPT Acute Rehabilitation Services  Office: [phone]    Jewel Baize 06/14/2021, 1:42 PM

## 2021-06-14 NOTE — NC FL2 (Signed)
[place] MEDICAID FL2 LEVEL OF CARE SCREENING TOOL     IDENTIFICATION  Patient Name: Shelby Trevino Birthdate: [birth year] Sex: female Admission Date (Current Location): 06/06/2021  [facility] and [place] Number:  Producer, television/film/video and Address:  [facility] [place], [address] [place], [place] [address]      Provider Number: [id number]  Attending Physician Name and Address:  Laurey Morale, MD  Relative Name and Phone Number:       Current Level of Care: Hospital Recommended Level of Care: Skilled Nursing Facility Prior Approval Number:    Date Approved/Denied:   PASRR [place]:  ([facility]  Discharge Plan: SNF    Current Diagnoses: Patient Active Problem List   Diagnosis Date Noted   Acute on chronic congestive heart failure (HCC)    Ischemic cardiomyopathy    Malnutrition of moderate degree 06/07/2021   ACS (acute coronary syndrome) (HCC) 06/06/2021   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   Dizziness 12/06/2015   Abnormal nuclear stress test    Chest pain 01/04/2011   CAD (coronary artery disease)    Hypertension    AV nodal tachycardia (HCC)     Orientation RESPIRATION BLADDER Height & Weight     Self, Time, Situation, Place  Normal Incontinent Weight: 116 lb 6.5 oz (52.8 kg) Height:  5\' 7"  (170.2 cm)  BEHAVIORAL SYMPTOMS/MOOD NEUROLOGICAL BOWEL NUTRITION STATUS      Continent Diet (Please See DC Summary)  AMBULATORY STATUS COMMUNICATION OF NEEDS Skin   Limited Assist Verbally Normal                       Personal Care Assistance Level of Assistance  Bathing, Feeding, Dressing Bathing Assistance: Limited assistance Feeding assistance: Independent Dressing Assistance: Limited assistance     Functional Limitations Info  Sight, Hearing, Speech Sight Info: Impaired Hearing Info: Adequate Speech Info: Adequate    SPECIAL CARE FACTORS FREQUENCY  PT (By licensed PT), OT (By licensed OT)     PT Frequency: 5x/week OT Frequency:  5x/week            Contractures Contractures Info: Not present    Additional Factors Info  Code Status, Allergies, Insulin Sliding Scale Code Status Info: FULL Allergies Info: Latex   Insulin Sliding Scale Info: See Med List       Current Medications (06/14/2021):  This is the current hospital active medication list Current Facility-Administered Medications  Medication Dose Route Frequency Provider Last Rate Last Admin   0.9 %  sodium chloride infusion  250 mL Intravenous PRN 08/14/2021, MD       0.9 %  sodium chloride infusion  250 mL Intravenous PRN Marykay Lex, MD       0.9 %  sodium chloride infusion  250 mL Intravenous PRN Marykay Lex, MD       acetaminophen (TYLENOL) tablet 650 mg  650 mg Oral Q4H PRN Marykay Lex, MD       alum & mag hydroxide-simeth (MAALOX/MYLANTA) 200-200-20 MG/5ML suspension 30 mL  30 mL Oral Q2H PRN 07-16-2000, MD   30 mL at 06/12/21 1132   aspirin chewable tablet 81 mg  81 mg Oral Daily 08/12/21, MD   81 mg at 06/14/21 0921   atorvastatin (LIPITOR) tablet 80 mg  80 mg Oral QHS 08/14/21, MD   80 mg at 06/13/21 2212   clopidogrel (PLAVIX) tablet 75 mg  75 mg Oral  Daily Marykay Lex, MD   75 mg at 06/14/21 8003   dapagliflozin propanediol (FARXIGA) tablet 10 mg  10 mg Oral Daily Marykay Lex, MD   10 mg at 06/14/21 0919   digoxin (LANOXIN) tablet 0.125 mg  0.125 mg Oral Daily Marykay Lex, MD   0.125 mg at 06/14/21 0921   enoxaparin (LOVENOX) injection 40 mg  40 mg Subcutaneous Q24H Marykay Lex, MD   40 mg at 06/13/21 1454   feeding supplement (ENSURE ENLIVE / ENSURE PLUS) liquid 237 mL  237 mL Oral TID BM Laurey Morale, MD   237 mL at 06/14/21 4917   furosemide (LASIX) tablet 40 mg  40 mg Oral Daily Marykay Lex, MD   40 mg at 06/14/21 9150   insulin aspart (novoLOG) injection 0-5 Units  0-5 Units Subcutaneous QHS Marykay Lex, MD   3 Units at 06/13/21 2212   insulin aspart (novoLOG)  injection 0-9 Units  0-9 Units Subcutaneous TID WC Marykay Lex, MD   3 Units at 06/14/21 1143   losartan (COZAAR) tablet 12.5 mg  12.5 mg Oral QHS Marykay Lex, MD   12.5 mg at 06/13/21 2212   magnesium hydroxide (MILK OF MAGNESIA) suspension 30 mL  30 mL Oral Daily PRN Marykay Lex, MD       metoprolol succinate (TOPROL-XL) 24 hr tablet 12.5 mg  12.5 mg Oral Daily Laurey Morale, MD   12.5 mg at 06/14/21 0919   multivitamin with minerals tablet 1 tablet  1 tablet Oral Daily Marykay Lex, MD   1 tablet at 06/14/21 5697   nitroGLYCERIN (NITROSTAT) SL tablet 0.4 mg  0.4 mg Sublingual Q5 min PRN Marykay Lex, MD       ondansetron [facility]) injection 4 mg  4 mg Intravenous Q6H PRN Marykay Lex, MD   4 mg at 06/13/21 1627   pantoprazole (PROTONIX) EC tablet 40 mg  40 mg Oral Daily Marykay Lex, MD   40 mg at 06/14/21 0921   sodium chloride flush (NS) 0.9 % injection 3 mL  3 mL Intravenous Q12H Marykay Lex, MD   3 mL at 06/14/21 9480   sodium chloride flush (NS) 0.9 % injection 3 mL  3 mL Intravenous PRN Marykay Lex, MD       sodium chloride flush (NS) 0.9 % injection 3 mL  3 mL Intravenous Q12H Marykay Lex, MD   3 mL at 06/13/21 2218   sodium chloride flush (NS) 0.9 % injection 3 mL  3 mL Intravenous PRN Marykay Lex, MD       sodium chloride flush (NS) 0.9 % injection 3 mL  3 mL Intravenous Q12H Marykay Lex, MD   3 mL at 06/13/21 2216   sodium chloride flush (NS) 0.9 % injection 3 mL  3 mL Intravenous Q12H Marykay Lex, MD   3 mL at 06/14/21 0923   sodium chloride flush (NS) 0.9 % injection 3 mL  3 mL Intravenous PRN Marykay Lex, MD       spironolactone (ALDACTONE) tablet 25 mg  25 mg Oral Daily Marykay Lex, MD   25 mg at 06/14/21 1655     Discharge Medications: Please see discharge summary for a list of discharge medications.  Relevant Imaging Results:  Relevant Lab Results:   Additional Information SSN#:  [id number]  Kelson Queenan, LCSW

## 2021-06-14 NOTE — TOC CM/SW Note (Signed)
HF TOC CM spoke to pt's son, Mellody Dance. Mellody Dance is requesting SNF rehab at facility in [place]. States he and his wife work full-time. And his sister works. They would not be available to supervision. [place] lives in her home alone in [place]. Updated attending and PT. Isidoro Donning RN3 CCM, Heart Failure TOC CM [phone]

## 2021-06-14 NOTE — NC FL2 (Signed)
[place] MEDICAID FL2 LEVEL OF CARE SCREENING TOOL     IDENTIFICATION  Patient Name: Shelby Trevino Birthdate: August 27, 1950 Sex: female Admission Date (Current Location): 06/06/2021  [facility] and [place] Number:  Producer, television/film/video and Address:  [facility] [place], 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   Dizziness 12/06/2015   Abnormal nuclear stress test    Chest pain 01/04/2011   CAD (coronary artery disease)    Hypertension    AV nodal tachycardia (HCC)     Orientation RESPIRATION BLADDER Height & Weight     Self, Time, Place, Situation  Normal Incontinent Weight: 116 lb 6.5 oz (52.8 kg) Height:  5\' 7"  (170.2 cm)  BEHAVIORAL SYMPTOMS/MOOD NEUROLOGICAL BOWEL NUTRITION STATUS      Continent Diet (Please See DC Summary)  AMBULATORY STATUS COMMUNICATION OF NEEDS Skin   Limited Assist Verbally Normal                       Personal Care Assistance Level of Assistance  Bathing, Feeding, Dressing Bathing Assistance: Limited assistance Feeding assistance: Independent Dressing Assistance: Limited assistance     Functional Limitations Info  Sight, Hearing, Speech Sight Info: Impaired Hearing Info: Adequate Speech Info: Adequate    SPECIAL CARE FACTORS FREQUENCY  PT (By licensed PT), OT (By licensed OT)     PT Frequency: 5x/week OT Frequency:  5x/week            Contractures Contractures Info: Not present    Additional Factors Info  Code Status, Allergies, Insulin Sliding Scale Code Status Info: FULL Allergies Info: Latex   Insulin Sliding Scale Info: See Med List       Current Medications (06/14/2021):  This is the current hospital active medication list Current Facility-Administered Medications  Medication Dose Route Frequency Provider Last Rate Last Admin   0.9 %  sodium chloride infusion  250 mL Intravenous PRN 08/14/2021, MD       0.9 %  sodium chloride infusion  250 mL Intravenous PRN Marykay Lex, MD       0.9 %  sodium chloride infusion  250 mL Intravenous PRN Marykay Lex, MD       acetaminophen (TYLENOL) tablet 650 mg  650 mg Oral Q4H PRN Marykay Lex, MD       alum & mag hydroxide-simeth (MAALOX/MYLANTA) 200-200-20 MG/5ML suspension 30 mL  30 mL Oral Q2H PRN 07-16-2000, MD   30 mL at 06/12/21 1132   aspirin chewable tablet 81 mg  81 mg Oral Daily 08/12/21, MD   81 mg at 06/14/21 0921   atorvastatin (LIPITOR) tablet 80 mg  80 mg Oral QHS 08/14/21, MD   80 mg at 06/13/21 2212   clopidogrel (PLAVIX) tablet 75 mg  75 mg Oral Daily 2213,  Piedad Climes, MD   75 mg at 06/14/21 4315   dapagliflozin propanediol (FARXIGA) tablet 10 mg  10 mg Oral Daily Marykay Lex, MD   10 mg at 06/14/21 0919   digoxin (LANOXIN) tablet 0.125 mg  0.125 mg Oral Daily Marykay Lex, MD   0.125 mg at 06/14/21 0921   enoxaparin (LOVENOX) injection 40 mg  40 mg Subcutaneous Q24H Marykay Lex, MD   40 mg at 06/14/21 1540   feeding supplement (ENSURE ENLIVE / ENSURE PLUS) liquid 237 mL  237 mL Oral TID BM Laurey Morale, MD   237 mL at 06/14/21 1408   furosemide (LASIX) tablet 40 mg  40 mg Oral Daily Marykay Lex, MD   40 mg at 06/14/21 4008   insulin aspart (novoLOG) injection 0-5 Units  0-5 Units Subcutaneous QHS Marykay Lex, MD   3 Units at 06/13/21 2212   insulin aspart (novoLOG)  injection 0-9 Units  0-9 Units Subcutaneous TID WC Marykay Lex, MD   3 Units at 06/14/21 1143   losartan (COZAAR) tablet 12.5 mg  12.5 mg Oral QHS Marykay Lex, MD   12.5 mg at 06/13/21 2212   magnesium hydroxide (MILK OF MAGNESIA) suspension 30 mL  30 mL Oral Daily PRN Marykay Lex, MD       metoprolol succinate (TOPROL-XL) 24 hr tablet 12.5 mg  12.5 mg Oral Daily Laurey Morale, MD   12.5 mg at 06/14/21 0919   multivitamin with minerals tablet 1 tablet  1 tablet Oral Daily Marykay Lex, MD   1 tablet at 06/14/21 6761   nitroGLYCERIN (NITROSTAT) SL tablet 0.4 mg  0.4 mg Sublingual Q5 min PRN Marykay Lex, MD       ondansetron [facility]) injection 4 mg  4 mg Intravenous Q6H PRN Marykay Lex, MD   4 mg at 06/13/21 1627   pantoprazole (PROTONIX) EC tablet 40 mg  40 mg Oral Daily Marykay Lex, MD   40 mg at 06/14/21 0921   sodium chloride flush (NS) 0.9 % injection 3 mL  3 mL Intravenous Q12H Marykay Lex, MD   3 mL at 06/14/21 9509   sodium chloride flush (NS) 0.9 % injection 3 mL  3 mL Intravenous PRN Marykay Lex, MD       sodium chloride flush (NS) 0.9 % injection 3 mL  3 mL Intravenous Q12H Marykay Lex, MD   3 mL at 06/13/21 2218   sodium chloride flush (NS) 0.9 % injection 3 mL  3 mL Intravenous PRN Marykay Lex, MD       sodium chloride flush (NS) 0.9 % injection 3 mL  3 mL Intravenous Q12H Marykay Lex, MD   3 mL at 06/13/21 2216   sodium chloride flush (NS) 0.9 % injection 3 mL  3 mL Intravenous Q12H Marykay Lex, MD   3 mL at 06/14/21 0923   sodium chloride flush (NS) 0.9 % injection 3 mL  3 mL Intravenous PRN Marykay Lex, MD       spironolactone (ALDACTONE) tablet 25 mg  25 mg Oral Daily Marykay Lex, MD   25 mg at 06/14/21 3267     Discharge Medications: Please see discharge summary for a list of discharge medications.  Relevant Imaging Results:  Relevant Lab Results:   Additional Information SSN# [id number]  [id number]  Roann Merk, LCSW

## 2021-06-14 NOTE — Progress Notes (Signed)
Mobility Specialist Progress Note:   06/14/21 0932  Mobility  Activity Contraindicated/medical hold   RN asking to hold mobility d/t pt needing PT eval and Groin/wrist site oozing overnight. Will follow-up as time allows.   [facility] Mobility Specialist

## 2021-06-14 NOTE — Progress Notes (Addendum)
Notified by PT that patient had a strenuous bowel movement that resulted in a small amount of red blood accompanied by bowel movement as well as a whitish pink doughnut shaped object that patient is unaware of where it came from or what it is. Michela Pitcher object is in a kidney basin in the patients bathroom currently. Assessed patients rectum and it looked normal without tearing or bleeding. Patient is not in any pain and MD Aundra Dubin and PA Rosita Fire are aware of the situation, as well as Conservation officer, historic buildings. No new orders at this time, will continue to monitor.  We identified the device as a pessary, it's supposed to be in her vagina to prevent uterine prolapse. MD Aundra Dubin made aware, will continue to monitor.

## 2021-06-14 NOTE — Progress Notes (Signed)
Occupational Therapy Treatment Patient Details Name: Shelby Trevino MRN: [id number] DOB: [birth year] Today's Date: 06/14/2021   History of present illness Patient is a 71 y/o female who presents on 5/31 with SOB, Rt abdominal pain, nausea and swelling. Admitted with newly diagnosed HF. PCI to proximal LAD on 6/7. CXR- PNA. s/p cardiac cath 6/2. PMH includes CAd, depression, DM, HTN, MVP, peripheral neuropathy.   OT comments  Pt requires min guard assistance with mobility and ADL tasks. If 24/7 S not available, recommend rehab at SNF to facilitate safe DC home.Pt demonstrates increased fatigue with activity as compared to previous session. Pt in agreement to rehab with the goal of returning to her home. Will continue to follow. VSS   Recommendations for follow up therapy are one component of a multi-disciplinary discharge planning process, led by the attending physician.  Recommendations may be updated based on patient status, additional functional criteria and insurance authorization.    Follow Up Recommendations  Skilled nursing-short term rehab (<3 hours/day)    Assistance Recommended at Discharge Frequent or constant Supervision/Assistance  Patient can return home with the following  Assistance with cooking/housework;A lot of help with bathing/dressing/bathroom;Direct supervision/assist for medications management;Direct supervision/assist for financial management;Assist for transportation   Equipment Recommendations  BSC/3in1    Recommendations for Other Services      Precautions / Restrictions Precautions Precautions: Fall Restrictions Weight Bearing Restrictions: No       Mobility Bed Mobility               General bed mobility comments: OOB in chair    Transfers Overall transfer level: Needs assistance   Transfers: Sit to/from Stand Sit to Stand: Supervision                 Balance     Sitting balance-Leahy Scale: Good       Standing balance-Leahy  Scale: Fair                             ADL either performed or assessed with clinical judgement   ADL Overall ADL's : Needs assistance/impaired     Grooming: Set up   Upper Body Bathing: Set up   Lower Body Bathing: Supervison/ safety;Set up   Upper Body Dressing : Set up   Lower Body Dressing: Minimal assistance Lower Body Dressing Details (indicate cue type and reason): groin sore from yesterday's procedure     Toileting- Clothing Manipulation and Hygiene: Supervision/safety       Functional mobility during ADLs: Min guard      Extremity/Trunk Assessment Upper Extremity Assessment Upper Extremity Assessment: Generalized weakness   Lower Extremity Assessment Lower Extremity Assessment: Defer to PT evaluation        Vision       Perception     Praxis      Cognition Arousal/Alertness: Awake/alert Behavior During Therapy: WFL for tasks assessed/performed Overall Cognitive Status: Impaired/Different from baseline Area of Impairment: Memory, Safety/judgement, Problem solving, Following commands, Orientation                 Orientation Level: Disoriented to Current Attention Level: Selective Memory: Decreased short-term memory Following Commands: Follows one step commands consistently (repetition) Safety/Judgement: Decreased awareness of safety, Decreased awareness of deficits Awareness: Emergent Problem Solving: Slow processing, Decreased initiation, Requires verbal cues, Requires tactile cues          Exercises      Shoulder Instructions  General Comments Noted a plastic hard/solid light pink doughnut-shaped object that passed with a strained BM from pt along with bright red blood on a couple of her feces pieces, notified RN and MD. Pt unable to explain where the plastic piece came from.    Pertinent Vitals/ Pain       Pain Assessment Faces Pain Scale: Hurts even more Pain Location: with trying to have BM Pain Descriptors  / Indicators: Moaning, Discomfort, Grimacing  Home Living                                          Prior Functioning/Environment              Frequency  Min 2X/week        Progress Toward Goals  OT Goals(current goals can now be found in the care plan section)  Progress towards OT goals: Progressing toward goals  Acute Rehab OT Goals Patient Stated Goal: to return home OT Goal Formulation: With patient Time For Goal Achievement: 06/26/21 Potential to Achieve Goals: Good ADL Goals Pt Will Perform Lower Body Bathing: with modified independence Pt Will Perform Lower Body Dressing: with modified independence Pt Will Transfer to Toilet: with modified independence;ambulating Pt Will Perform Toileting - Clothing Manipulation and hygiene: with modified independence Additional ADL Goal #1: Pt will verbalize 3 strategies to reduce risk of falls  Plan Discharge plan needs to be updated    Co-evaluation                 AM-PAC OT "6 Clicks" Daily Activity     Outcome Measure   Help from another person eating meals?: None Help from another person taking care of personal grooming?: A Little Help from another person toileting, which includes using toliet, bedpan, or urinal?: A Little Help from another person bathing (including washing, rinsing, drying)?: A Little Help from another person to put on and taking off regular upper body clothing?: A Little Help from another person to put on and taking off regular lower body clothing?: A Little 6 Click Score: 19    End of Session Equipment Utilized During Treatment: Rolling walker (2 wheels)  OT Visit Diagnosis: Muscle weakness (generalized) (M62.81);Other symptoms and signs involving cognitive function   Activity Tolerance Patient tolerated treatment well   Patient Left in chair;with call bell/phone within reach;with chair alarm set   Nurse Communication Mobility status        Time: 8242-3536 OT  Time Calculation (min): 15 min  Charges: OT General Charges $OT Visit: 1 Visit OT Treatments $Self Care/Home Management : 8-22 mins  Luisa Dago, OT/L   Acute OT Clinical Specialist Acute Rehabilitation Services Pager [phone] Office [phone]   [facility] 06/14/2021, 1:45 PM

## 2021-06-14 NOTE — Discharge Summary (Signed)
Advanced Heart Failure Team  Discharge Summary   Patient ID: Shelby Trevino MRN: [id number], DOB/AGE: [birth year] 73 y.o. Admit date: 06/06/2021 D/C date:     06/15/2021   Primary Discharge Diagnoses:  Acute on Chronic Systolic Heart Failure/Ischemic CM CAD s/p LAD PCI SVT  Moderate Mitral Regurgitation  Type 2 DM  HLD, LDL Goal < 70    Hospital Course:   71 y.o. female with history of AVNRT s/p ablation, CAD (mild to moderate disease in LAD on cath 12/2013) HTN, HLD, DM II. Quite functional at baseline but may have some memory issues (daughter said that this is "not bad").  Works as a Programmer, applications full time.    Presented to ED 06/06/21 with complaints of CP, worsening dyspnea, orthopnea, PND and fatigue for about a week. She was admitted for acute systolic CHF. Lactic acid 2.1>1.6,  HS troponin 546>584, BNP 2,924, Scr 1.01, CO2 22, K 5.0, Na 137. ECG sinus tach 110, anterior and inferior Qs.  CTA chest negative for PE, + fpr small bilateral effusions, extensive coronary calcifications in LAD territory.     Echo: EF 20-25%, RWMA in LAD territory, RV moderately reduced, moderate MR.   [facility] 6/2 showed totally occluded p to m RCA which fills by collaterals from Cx and LAD, Cx okay, 95% p to m LAD, 95% D1, RA mean 14, PA 40/18 (28), PCWP mean 23, LVEDP 27, Fick CO/CI 3.84/2.22.   She was considered for CABG. CT surgery team was consulted. AHF team was also consulted post cath. She was diuresed further w/ IV Lasix. GDMT initiated. cMRI was done for viability study. Delayed enhancement imaging suggested minimal viability in the basal to mid inferior and inferoseptal walls. The mid to apical anteroseptal and anterior walls were borderline in terms of expected viability with revascularization, with 50% wall thickness LGE. LVEF 23%, RVEF 24%, mod MR.  Reviewed cMRI with Dr. Cyndia Bent. With severe RV dysfunction, borderline viability, and some question of early dementia, we decided that she would  likely be best served by PCI rather than CABG.  On 6/7, she had successful PCI to LAD. Tolerated procedure well w/o complication. Placed on DAPT w/ ASA + Plavix and high intensity statin.   HF further optimized. Diuresed and transitioned to PO diuretics. GDMT further titrated.   Also of note, during hospitalization, had run of SVT that required adenosine for termination. She had no further recurrence.   PT assessed prior to d/c and recommended SNF. SW assisted w/ arrangements.   On 6/8, she was last seen and examined by Dr. Aundra Dubin and felt stable for discharge to SNF.    Meds for home: Plavix 75 daily, ASA 81 daily, atorvastatin 80 daily, Lasix 40 daily, spironolactone 25 daily, digoxin 0.125 daily, Farxiga 10 daily, losartan 12.5 daily, Toprol XL 12.5 daily.   F[facility]u arranged in the [facility].   Discharge Weight Range: 119 lb  Discharge Vitals: Blood pressure 112/81, pulse 89, temperature 98.6 F (37 C), temperature source Oral, resp. rate 14, height 5\' 7"  (1.702 m), weight 54 kg, SpO2 94 %.  Labs: Lab Results  Component Value Date   WBC 6.6 06/14/2021   HGB 13.1 06/14/2021   HCT 39.0 06/14/2021   MCV 86.1 06/14/2021   PLT 414 (H) 06/14/2021    Recent Labs  Lab 06/09/21 0544 06/10/21 0903 06/15/21 0202  NA 136   < > 135  K 4.0   < > 4.1  CL 100   < > 103  CO2 26   < > 25  BUN 24*   < > 16  CREATININE 0.90   < > 0.75  CALCIUM 8.5*   < > 8.9  PROT 5.8*  --   --   BILITOT 0.8  --   --   ALKPHOS 72  --   --   ALT 83*  --   --   AST 49*  --   --   GLUCOSE 157*   < > 174*   < > = values in this interval not displayed.   Lab Results  Component Value Date   CHOL 172 02/28/2021   HDL 48 02/28/2021   LDLCALC 107 (H) 02/28/2021   TRIG 90 02/28/2021   BNP (last 3 results) Recent Labs    06/06/21 1757  BNP 2,924.0*    ProBNP (last 3 results) No results for input(s): "PROBNP" in the last 8760 hours.   Diagnostic Studies/Procedures   2D Echo 06/07/21 Left ventricular  ejection fraction, by estimation, is 20 to 25%. The left ventricle has severely decreased function. The left ventricle demonstrates regional wall motion abnormalities (see scoring diagram/findings for description). Left ventricular diastolic parameters are consistent with Grade III diastolic dysfunction (restrictive). Elevated left ventricular end-diastolic pressure. The mid to apical antero-lateral, entire anterior septum, apex and basal anterior walls are akinetic with findings consistent with mid LAD occlusion, with hypokinesis in the remaining myocardial wall segments. 1. Right ventricular systolic function is moderately reduced. The right ventricular size is mildly enlarged. There is normal pulmonary artery systolic pressure. 2. 3. Left atrial size was mildly dilated. 4. Right atrial size was mildly dilated. The mitral valve is normal in structure. Moderate mitral valve regurgitation. No evidence of mitral stenosis. 5. 6. Tricuspid valve regurgitation is mild to moderate. The aortic valve is tricuspid. Aortic valve regurgitation is not visualized. Aortic valve sclerosis/calcification is present, without any evidence of aortic stenosis. 7. The inferior vena cava is dilated in size with <50% respiratory variability, suggesting right atrial pressure of 15 mmHg.  Diagnostic R/LHC 06/08/21 CONCLUSIONS: Ischemic cardiomyopathy with acute on chronic systolic heart failure, LVEDP 27 mmHg. CPO = 0.71. Widely patent left main Proximal to mid segmental calcified 95% LAD.  First diagonal of moderate size also 95% obstructed.  LAD and circumflex supply collaterals to the right coronary. Circumflex is widely patent with irregularities but no high-grade obstruction. Totally occluded proximal to mid right coronary.  Fills by left to right collaterals. Mild pulmonary artery hypertension with mean PA pressure 28 mmHg.  Etiology likely WHO group 2 etiology.  Capillary wedge mean pressure 23 mmHg.   Pulmonary vascular resistance 1.3 Wood units Elevated right atrial pressure, mean pressure 14 mmHg. PAPI = 1.6 Cardiac output 3.84 L/min; cardiac index 2.22 L/min/m. CPO = 0.71   cMRI 06/11/21 IMPRESSION: 1. Moderately dilated LV with EF 23%, wall motion abnormalities as noted above.   2.  Mildly dilated RV with EF 24%.   3. Moderate mitral regurgitation noted, flow sequences to confirm were not done.   4. Delayed enhancement imaging suggested minimal viability in the basal to mid inferior and inferoseptal walls. The mid to apical anteroseptal and anterior walls were borderline in terms of expected viability with revascularization, with 50% wall thickness LGE.   CARDIAC CATHETERIZATION w/ PCI 06/13/21    Prox LAD lesion is 55% stenosed with 95% stenosed side branch in 1st Diag.   -------------------------------------------   Culprit Lesion Segment: Mid LAD-1 lesion is 90% stenosed.  Mid LAD-2 lesion  is 95% stenosed.   Orbital atherectomy followed by balloon angioplasty was performed on both lesions   Balloon angioplasty was performed on both lesion sites using a BALLN SAPPHIRE 2.5X15.=> Followed by OCT imaging   A drug-eluting stent was successfully placed covering both lesions (careful not to cover the more proximal 55% lesion-or approach 3rdDiag/distal LAD lesion) using a SYNERGY XD 3.0X28 -> deployed to 3.2 mm   Post intervention, there is a 0% residual stenosis.   -------------------------------------------   Dist LAD lesion is 65% stenosed.   Prox RCA lesion is 100% stenosed -PDA fills via septal collaterals.   LV end diastolic pressure is mildly elevated. Successful Orbital Atherectomy and OCT guided DES PCI of proximal to mid LAD covering 2 major lesions with a Synergy DES 3.0 mm x 28 mm deployed to 3.2 mm Recommendations Return to nursing unit for ongoing care Zephyr band removal per protocol Femoral venous sheath removal based on ACT Gentle hydration Continue management by heart failure  team-Dr. Aundra Dubin Dr. Aundra Dubin and patient's son both updated on the case. Glenetta Hew, MD   Discharge Medications   Allergies as of 06/15/2021       Reactions   Latex    Itching and rash        Medication List     TAKE these medications    ascorbic acid 500 MG tablet Commonly known as: VITAMIN C Take 1 tablet by mouth daily.   aspirin EC 81 MG tablet Take 1 tablet (81 mg total) by mouth daily.   atorvastatin 80 MG tablet Commonly known as: LIPITOR Take 1 tablet (80 mg total) by mouth at bedtime. What changed:  medication strength how much to take   AZO URINARY TRACT DEFENSE PO Take 2 capsules by mouth daily.   CENTRUM SILVER PO Take 1 tablet by mouth daily.   clopidogrel 75 MG tablet Commonly known as: PLAVIX Take 1 tablet (75 mg total) by mouth daily.   clotrimazole-betamethasone cream Commonly known as: LOTRISONE Apply 1 application. topically 2 (two) times daily.   dapagliflozin propanediol 10 MG Tabs tablet Commonly known as: FARXIGA Take 1 tablet (10 mg total) by mouth daily.   digoxin 0.125 MG tablet Commonly known as: LANOXIN Take 1 tablet (0.125 mg total) by mouth daily.   estradiol 0.1 MG/GM vaginal cream Commonly known as: ESTRACE Place vaginally.   furosemide 40 MG tablet Commonly known as: LASIX Take 1 tablet (40 mg total) by mouth daily.   losartan 25 MG tablet Commonly known as: COZAAR Take 1/2 tablet (12.5 mg total) by mouth at bedtime.   metFORMIN 500 MG 24 hr tablet Commonly known as: GLUCOPHAGE-XR Take 1 tablet (500 mg total) by mouth 2 (two) times daily. Start taking on: June 16, 2021   metoprolol succinate 25 MG 24 hr tablet Commonly known as: TOPROL-XL Take 1/2 tablet (12.5 mg total) by mouth daily.   nitroGLYCERIN 0.4 MG SL tablet Commonly known as: NITROSTAT Place 1 tablet (0.4 mg total) under the tongue every 5 (five) minutes as needed for chest pain.   pantoprazole 40 MG tablet Commonly known as: PROTONIX Take 1  tablet (40 mg total) by mouth daily.   spironolactone 25 MG tablet Commonly known as: ALDACTONE Take 1 tablet (25 mg total) by mouth daily.   Vitamin D3 125 MCG (5000 UT) Caps Take 2,000 Units by mouth daily.   vitamin E 200 UNIT capsule Take by mouth.        Disposition   The patient will  be discharged in stable condition to home. Discharge Instructions     (HEART FAILURE PATIENTS) Call MD:  Anytime you have any of the following symptoms: 1) 3 pound weight gain in 24 hours or 5 pounds in 1 week 2) shortness of breath, with or without a dry hacking cough 3) swelling in the hands, feet or stomach 4) if you have to sleep on extra pillows at night in order to breathe.   Complete by: As directed    AMB Referral to Cardiac Rehabilitation - Phase II   Complete by: As directed    Diagnosis:  Heart Failure (see criteria below if ordering Phase II) Coronary Stents     Heart Failure Type: Chronic Systolic & Diastolic   After initial evaluation and assessments completed: Virtual Based Care may be provided alone or in conjunction with Phase 2 Cardiac Rehab based on patient barriers.: Yes   Diet - low sodium heart healthy   Complete by: As directed    Heart Failure patients record your daily weight using the same scale at the same time of day   Complete by: As directed    Increase activity slowly   Complete by: As directed    STOP any activity that causes chest pain, shortness of breath, dizziness, sweating, or exessive weakness   Complete by: As directed        Follow-up Information     [facility] up.   Specialty: Cardiology Why: 06/28/21 at 12:00 PM   The Advanced [facility] Failure [facility] at [facility], [place], parking garage code [place] information: [address] [id number] [place] [phone]                  Duration of Discharge Encounter: Greater than 35  minutes   Signed, Lyda Jester, PA-C  06/15/2021, 10:57 AM

## 2021-06-15 ENCOUNTER — Other Ambulatory Visit (HOSPITAL_COMMUNITY): Payer: Self-pay

## 2021-06-15 DIAGNOSIS — Z79899 Other long term (current) drug therapy: Secondary | ICD-10-CM | POA: Diagnosis not present

## 2021-06-15 DIAGNOSIS — E118 Type 2 diabetes mellitus with unspecified complications: Secondary | ICD-10-CM | POA: Diagnosis not present

## 2021-06-15 DIAGNOSIS — I471 Supraventricular tachycardia: Secondary | ICD-10-CM | POA: Diagnosis not present

## 2021-06-15 DIAGNOSIS — R079 Chest pain, unspecified: Secondary | ICD-10-CM | POA: Diagnosis not present

## 2021-06-15 DIAGNOSIS — R4181 Age-related cognitive decline: Secondary | ICD-10-CM | POA: Diagnosis not present

## 2021-06-15 DIAGNOSIS — R5381 Other malaise: Secondary | ICD-10-CM | POA: Diagnosis not present

## 2021-06-15 DIAGNOSIS — R5383 Other fatigue: Secondary | ICD-10-CM | POA: Diagnosis not present

## 2021-06-15 DIAGNOSIS — I2510 Atherosclerotic heart disease of native coronary artery without angina pectoris: Secondary | ICD-10-CM | POA: Diagnosis not present

## 2021-06-15 DIAGNOSIS — Z9861 Coronary angioplasty status: Secondary | ICD-10-CM | POA: Diagnosis not present

## 2021-06-15 DIAGNOSIS — I10 Essential (primary) hypertension: Secondary | ICD-10-CM | POA: Diagnosis not present

## 2021-06-15 DIAGNOSIS — I255 Ischemic cardiomyopathy: Secondary | ICD-10-CM | POA: Diagnosis not present

## 2021-06-15 DIAGNOSIS — R262 Difficulty in walking, not elsewhere classified: Secondary | ICD-10-CM | POA: Diagnosis not present

## 2021-06-15 DIAGNOSIS — Z7984 Long term (current) use of oral hypoglycemic drugs: Secondary | ICD-10-CM | POA: Diagnosis not present

## 2021-06-15 DIAGNOSIS — I5022 Chronic systolic (congestive) heart failure: Secondary | ICD-10-CM | POA: Diagnosis not present

## 2021-06-15 DIAGNOSIS — I493 Ventricular premature depolarization: Secondary | ICD-10-CM | POA: Diagnosis not present

## 2021-06-15 DIAGNOSIS — R42 Dizziness and giddiness: Secondary | ICD-10-CM | POA: Diagnosis not present

## 2021-06-15 DIAGNOSIS — Z7902 Long term (current) use of antithrombotics/antiplatelets: Secondary | ICD-10-CM | POA: Diagnosis not present

## 2021-06-15 DIAGNOSIS — R531 Weakness: Secondary | ICD-10-CM | POA: Diagnosis not present

## 2021-06-15 DIAGNOSIS — E1142 Type 2 diabetes mellitus with diabetic polyneuropathy: Secondary | ICD-10-CM | POA: Diagnosis not present

## 2021-06-15 DIAGNOSIS — Z7982 Long term (current) use of aspirin: Secondary | ICD-10-CM | POA: Diagnosis not present

## 2021-06-15 DIAGNOSIS — I5023 Acute on chronic systolic (congestive) heart failure: Secondary | ICD-10-CM | POA: Diagnosis not present

## 2021-06-15 DIAGNOSIS — E785 Hyperlipidemia, unspecified: Secondary | ICD-10-CM | POA: Diagnosis not present

## 2021-06-15 DIAGNOSIS — K219 Gastro-esophageal reflux disease without esophagitis: Secondary | ICD-10-CM | POA: Diagnosis not present

## 2021-06-15 DIAGNOSIS — I249 Acute ischemic heart disease, unspecified: Secondary | ICD-10-CM | POA: Diagnosis not present

## 2021-06-15 DIAGNOSIS — Z7401 Bed confinement status: Secondary | ICD-10-CM | POA: Diagnosis not present

## 2021-06-15 DIAGNOSIS — E440 Moderate protein-calorie malnutrition: Secondary | ICD-10-CM | POA: Diagnosis not present

## 2021-06-15 DIAGNOSIS — I110 Hypertensive heart disease with heart failure: Secondary | ICD-10-CM | POA: Diagnosis not present

## 2021-06-15 DIAGNOSIS — M6281 Muscle weakness (generalized): Secondary | ICD-10-CM | POA: Diagnosis not present

## 2021-06-15 DIAGNOSIS — R0789 Other chest pain: Secondary | ICD-10-CM | POA: Diagnosis not present

## 2021-06-15 DIAGNOSIS — Z955 Presence of coronary angioplasty implant and graft: Secondary | ICD-10-CM | POA: Diagnosis not present

## 2021-06-15 DIAGNOSIS — I509 Heart failure, unspecified: Secondary | ICD-10-CM | POA: Diagnosis not present

## 2021-06-15 LAB — GLUCOSE, CAPILLARY
Glucose-Capillary: 214 mg/dL — ABNORMAL HIGH (ref 70–99)
Glucose-Capillary: 176 mg/dL — ABNORMAL HIGH (ref 70–99)
Glucose-Capillary: 240 mg/dL — ABNORMAL HIGH (ref 70–99)
Glucose-Capillary: 157 mg/dL — ABNORMAL HIGH (ref 70–99)

## 2021-06-15 LAB — BASIC METABOLIC PANEL
Anion gap: 7 (ref 5–15)
BUN: 16 mg/dL (ref 8–23)
CO2: 25 mmol/L (ref 22–32)
Calcium: 8.9 mg/dL (ref 8.9–10.3)
Chloride: 103 mmol/L (ref 98–111)
Creatinine, Ser: 0.75 mg/dL (ref 0.44–1.00)
GFR, Estimated: >60 mL/min (ref >60)
Glucose, Bld: 174 mg/dL — ABNORMAL HIGH (ref 70–99)
Potassium: 4.1 mmol/L (ref 3.5–5.1)
Sodium: 135 mmol/L (ref 135–145)

## 2021-06-15 MED ORDER — ATORVASTATIN CALCIUM 80 MG PO TABS: 80.0000 mg | ORAL_TABLET | Freq: Every day | ORAL | 5 refills | Status: DC

## 2021-06-15 MED ORDER — SPIRONOLACTONE 25 MG PO TABS: 25.0000 mg | ORAL_TABLET | Freq: Every day | ORAL | 5 refills | Status: DC

## 2021-06-15 MED ORDER — PANTOPRAZOLE SODIUM 40 MG PO TBEC: 40.0000 mg | DELAYED_RELEASE_TABLET | Freq: Every day | ORAL | 3 refills | Status: DC

## 2021-06-15 MED ORDER — LOSARTAN POTASSIUM 25 MG PO TABS
12.5000 mg | ORAL_TABLET | Freq: Every day | ORAL | 5 refills | Status: DC
  Filled 2021-06-15: qty 15, 30d supply, fill #0

## 2021-06-15 MED ORDER — FUROSEMIDE 40 MG PO TABS: 40.0000 mg | ORAL_TABLET | Freq: Every day | ORAL | 5 refills | Status: DC

## 2021-06-15 MED ORDER — CLOPIDOGREL BISULFATE 75 MG PO TABS: 75.0000 mg | ORAL_TABLET | Freq: Every day | ORAL | 11 refills | Status: DC

## 2021-06-15 MED ORDER — DIGOXIN 125 MCG PO TABS: 0.1250 mg | ORAL_TABLET | Freq: Every day | ORAL | 5 refills | Status: DC

## 2021-06-15 MED ORDER — METOPROLOL SUCCINATE ER 25 MG PO TB24: 12.5000 mg | ORAL_TABLET | Freq: Every day | ORAL | 5 refills | Status: DC

## 2021-06-15 MED ORDER — ASPIRIN 81 MG PO TBEC
81.0000 mg | DELAYED_RELEASE_TABLET | Freq: Every day | ORAL | 11 refills | Status: DC
  Filled 2021-06-15: qty 30, 30d supply, fill #0

## 2021-06-15 MED ORDER — NITROGLYCERIN 0.4 MG SL SUBL: 0.4000 mg | SUBLINGUAL_TABLET | SUBLINGUAL | 2 refills | Status: DC | PRN

## 2021-06-15 MED ORDER — DAPAGLIFLOZIN PROPANEDIOL 10 MG PO TABS: 10.0000 mg | ORAL_TABLET | Freq: Every day | ORAL | 5 refills | Status: DC

## 2021-06-15 NOTE — Progress Notes (Signed)
Mobility Specialist Progress Note    06/15/21 1607  Mobility  Activity Ambulated with assistance in hallway  Level of Assistance Contact guard assist, steadying assist  Assistive Device Front wheel walker  Distance Ambulated (ft) 400 ft  Activity Response Tolerated well  $Mobility charge 1 Mobility   Pre-Mobility: 92 HR, 98% SpO2 During Mobility: 111 HR Post-Mobility: 97 HR, 99% SpO2  Pt received in chair and agreeable. No complaints on walk. Slow gait with some crossover. Returned to chair with call bell in reach.    Hildred Alamin Mobility Specialist

## 2021-06-15 NOTE — TOC CM/SW Note (Signed)
HF TOC CM contacted [facility] rep, Amy to make aware pt's dc to SNF for rehab. Isidoro Donning RN3 CCM, Heart Failure TOC CM [phone]

## 2021-06-15 NOTE — Progress Notes (Addendum)
Called report to lynn at [facility]

## 2021-06-15 NOTE — Plan of Care (Signed)

## 2021-06-15 NOTE — Progress Notes (Addendum)
Patient ID: Shelby Trevino, female   DOB: 12/08/1950, 71 y.o.   MRN: [id number]     Advanced Heart Failure Rounding Note  PCP-Cardiologist: None   Subjective:    cMRI 6/5  LVEF 23%, RVEF 24%, mod MR.  Delayed enhancement imaging suggested minimal viability in the basal to mid inferior and inferoseptal walls. The mid to apical anteroseptal and anterior walls were borderline in terms of expected viability with revascularization, with 50% wall thickness LGE.  PCI to proximal LAD on 6/7.   No complaints this am. Denies chest pain or dyspnea. Wishes she could go home but agrees to SNF for rehab.   Objective:   Weight Range: 54 kg Body mass index is 18.65 kg/m.   Vital Signs:   Temp:  [98.1 F (36.7 C)-98.8 F (37.1 C)] 98.8 F (37.1 C) (06/09 0427) Pulse Rate:  [73-100] 88 (06/09 0427) Resp:  [15-21] 20 (06/09 0427) BP: (96-116)/(55-92) 96/55 (06/09 0427) SpO2:  [93 %-98 %] 95 % (06/09 0427) Weight:  [54 kg] 54 kg (06/09 0607) Last BM Date : 06/14/21  Weight change: Filed Weights   06/13/21 0418 06/14/21 0500 06/15/21 0607  Weight: 51.8 kg 52.8 kg 54 kg    Intake/Output:   Intake/Output Summary (Last 24 hours) at 06/15/2021 0736 Last data filed at 06/15/2021 0428 Gross per 24 hour  Intake --  Output 750 ml  Net -750 ml      Physical Exam   General:  No distress. Lying comfortably in bed. HEENT: normal Neck: supple. no JVD. Carotids 2+ bilat; no bruits.  Cor: PMI nondisplaced. Regular rate & rhythm. No rubs, gallops or murmurs. Lungs: clear Abdomen: soft, nontender, nondistended.  Extremities: no cyanosis, clubbing, rash, edema, R radial site stable without hematoma Neuro: alert & orientedx3, cranial nerves grossly intact. moves all 4 extremities w/o difficulty. Affect pleasant   Telemetry   SR 80s-90s, ~ 5 PVCs/min (personally reviewed)  Labs    CBC Recent Labs    06/13/21 1112 06/14/21 0143  WBC 7.6 6.6  HGB 14.2 13.1  HCT 43.4 39.0  MCV 85.3  86.1  PLT 400 [id number]   Basic Metabolic Panel Recent Labs    06/13/21 0105 06/14/21 0143 06/15/21 0202  NA 136 135 135  K 4.2 4.6 4.1  CL 101 102 103  CO2 26 22 25   GLUCOSE 138* 148* 174*  BUN 16 21 16   CREATININE 0.79 0.81 0.75  CALCIUM 9.0 8.8* 8.9  MG 2.3  --   --    Liver Function Tests No results for input(s): "AST", "ALT", "ALKPHOS", "BILITOT", "PROT", "ALBUMIN" in the last 72 hours.  No results for input(s): "LIPASE", "AMYLASE" in the last 72 hours. Cardiac Enzymes No results for input(s): "CKTOTAL", "CKMB", "CKMBINDEX", "TROPONINI" in the last 72 hours.  BNP: BNP (last 3 results) Recent Labs    06/06/21 1757  BNP 2,924.0*    ProBNP (last 3 results) No results for input(s): "PROBNP" in the last 8760 hours.   D-Dimer No results for input(s): "DDIMER" in the last 72 hours. Hemoglobin A1C No results for input(s): "HGBA1C" in the last 72 hours.  Fasting Lipid Panel No results for input(s): "CHOL", "HDL", "LDLCALC", "TRIG", "CHOLHDL", "LDLDIRECT" in the last 72 hours. Thyroid Function Tests No results for input(s): "TSH", "T4TOTAL", "T3FREE", "THYROIDAB" in the last 72 hours.  Invalid input(s): "FREET3"   Other results:   Imaging    No results found.   Medications:     Scheduled Medications:  aspirin  81 mg Oral Daily   atorvastatin  80 mg Oral QHS   clopidogrel  75 mg Oral Daily   dapagliflozin propanediol  10 mg Oral Daily   digoxin  0.125 mg Oral Daily   enoxaparin (LOVENOX) injection  40 mg Subcutaneous Q24H   feeding supplement  237 mL Oral TID BM   furosemide  40 mg Oral Daily   insulin aspart  0-5 Units Subcutaneous QHS   insulin aspart  0-9 Units Subcutaneous TID WC   losartan  12.5 mg Oral QHS   metoprolol succinate  12.5 mg Oral Daily   multivitamin with minerals  1 tablet Oral Daily   pantoprazole  40 mg Oral Daily   sodium chloride flush  3 mL Intravenous Q12H   sodium chloride flush  3 mL Intravenous Q12H   sodium chloride  flush  3 mL Intravenous Q12H   sodium chloride flush  3 mL Intravenous Q12H   spironolactone  25 mg Oral Daily    Infusions:  sodium chloride     sodium chloride     sodium chloride      PRN Medications: sodium chloride, sodium chloride, sodium chloride, acetaminophen, alum & mag hydroxide-simeth, magnesium hydroxide, nitroGLYCERIN, ondansetron (ZOFRAN) IV, sodium chloride flush, sodium chloride flush, sodium chloride flush   Assessment/Plan   1. Acute on chronic systolic CHF: Ischemic cardiomyopathy.  Echo this admission with EF 20-25% with moderate RV dysfunction and moderate MR, dilated IVC.  With inferior and anterior Qs on ECG, she may have significant non-viable territory in her LV.  RHC on 6/2 showed elevated filling pressures with CI 2.22. cMRI LVEF 23%, RVEF 24%, delayed enhancement imaging suggested minimal viability in the basal to mid inferior and inferoseptal walls. The mid to apical anteroseptal and anterior walls were borderline in terms of expected viability with revascularization. She diuresed well w/ IV Lasix. Volume stable on exam  - Continue PO Lasix 40 mg daily  - Continue digoxin 0.125 daily.  - Continue spironolactone 25 daily.  - Continue Farxiga 10 daily.  - Continue Losartan 12.5 mg qhs. BP too soft for Entresto   - Continue Toprol XL 12.5 mg daily.  2. CAD: Admitted with CHF but had anginal-type symptoms as well.  HS-TnI at admission did not suggest ACS at the time of admission.  Inferior and anterior Qs suggested out of hospital MI.  Cath on 6/2 showed severe proximal to mid LAD and proximal D1 disease as well as occluded RCA with collaterals.  She has potential CABG targets.  She is a diabetic with systolic CHF.  She is active at baseline (Works as Programmer, applications).  There is some question about her memory. Reviewed cMRI with Dr. Cyndia Bent, with severe RV dysfunction, borderline viability, and some question of early dementia, we decided that she would likely be best  served by PCI rather than CABG.  On 6/7, she had PCI to LAD.  - Continue ASA 81 mg daily  - atorvastatin 80 mg daily.  - Continue Plavix 75 daily.  3. Type 2 diabetes: Added [place].  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 Developed SVT on 6/5.  Terminated w/ adenosine x 1  - no further recurrence, monitoring on tele  - keep K > 4.0 and Mg > 2.0  SNF for rehab recommended by PT/OT. Bed arranged. Seen by Dr. Aundra Dubin on 6/9 and felt stable for d/c to SNF.   Has f/u in HF clinic scheduled.   Meds for home:  Plavix 75 daily, ASA 81 daily, atorvastatin 80 daily, Lasix 40 daily, spironolactone 25 daily, digoxin 0.125 daily, Farxiga 10 daily, losartan 12.5 daily, Toprol XL 12.5 daily.    Length of Stay: 9  [facility], [place], PA-C  06/15/2021, 7:36 AM  Advanced Heart Failure Team Pager [phone] (M-F; 7a - 5p)  Please contact [facility] for night-coverage after hours (5p -7a ) and weekends on [email or web address]   Patient seen with PA, agree with the above note.   No chest pain or dyspnea.  Creatinine stable.  SBP 90s-100s.   General: NAD Neck: No JVD, no thyromegaly or thyroid nodule.  Lungs: Clear to auscultation bilaterally with normal respiratory effort. CV: Nondisplaced PMI.  Heart regular S1/S2, no S3/S4, no murmur.  No peripheral edema.   Abdomen: Soft, nontender, no hepatosplenomegaly, no distention.  Skin: Intact without lesions or rashes.  Neurologic: Alert and oriented x 3.  Psych: Normal affect. Extremities: No clubbing or cyanosis.  HEENT: Normal.   No BP room to titrate meds today.    I think she is ready for SNF as above.  I do not think she can be at home by herself yet.  I worry that she may have significant dementia.  Hopefully, there will be a SNF bed today.  Can go home on the above meds.   Loralie Champagne 06/15/2021 8:56 AM

## 2021-06-15 NOTE — TOC Progression Note (Addendum)
Transition of Care [facility]) - Progression Note    Patient Details  Name: Shelby Trevino MRN: [id number] Date of Birth: [birth year]  Transition of Care [facility]) CM/SW Contact  Enedelia Martorelli, LCSW Phone Number: 06/15/2021, 9:09 AM  Clinical Narrative:    HF CSW spoke with Bryson Ha at [facility], SNF and she reported that she can take Shelby Trevino today for discharge. CSW reached out to the patient's son, Shelby Trevino [phone] to let him know about the plan for discharge however he didn't answer the phone and the CSW couldn't leave a voicemail due to the mailbox being full. CSW attempted to outreach the patient's daughter, Shelby Trevino [phone] to let her know about the discharge plan however she didn't answer and the CSW couldn't leave a voicemail as her mailbox was full. CSW will attempt to call family again later for an update. 9:34am - Shelby Trevino called the CSW back and is aware about the discharge today to [facility] and is okay with the CSW arranging transportation to get his mom over to the SNF.  CSW will continue to follow throughout discharge.      Expected Discharge Plan: [place] Barriers to Discharge: Continued Medical Work up  Expected Discharge Plan and Services Expected Discharge Plan: [place] In-house Referral: Clinical Social Work Discharge Planning Services: CM Consult Post Acute Care Choice: [place] arrangements for the past 2 months: [place] Expected Discharge Date: 06/15/21               DME Arranged: N/A         HH Arranged: RN, PT HH Agency: [facility] Date HH Agency Contacted: 06/11/21 Time [place]: 1339 Representative spoke with at [place]: [place] ([place]) Interventions    Readmission Risk Interventions    06/07/2021    2:36 PM  Readmission Risk Prevention Plan  Post Dischage Appt Complete  Transportation Screening Complete

## 2021-06-15 NOTE — Progress Notes (Signed)
CARDIAC REHAB PHASE I   PRE:  Rate/Rhythm: 91 SR    BP: sitting 94/64    SaO2: 98 RA  MODE:  Ambulation: 320 ft   POST:  Rate/Rhythm: 104 ST    BP: sitting 112/81     SaO2: 100 RA  Pt stood independently and ambulated with RW. Stronger today, quicker pace. No major c/o. Return to bed. Sts she read HF booklet but unable to recall. Reminded to not add salt to food and take meds. Encouraged to tell RN at SNF if swollen.  [id number]   [place], ACSM 06/15/2021 10:32 AM

## 2021-06-15 NOTE — TOC Transition Note (Signed)
Transition of Care [facility]) - CM/SW Discharge Note   Patient Details  Name: Shelby Trevino MRN: [id number] Date of Birth: 1950/02/08  [facility] Care [facility]) CM/SW Contact:  Edmundo Tedesco, LCSW Phone Number: 06/15/2021, 9:48 AM   Clinical Narrative:    Patient will DC to: [facility] SNF Anticipated DC date: 06/15/21 Family notified: yes, son, Civil engineer, contracting by: [employer]   Per MD patient ready for DC to [facility], SNF. RN to call report prior to discharge (Room# 310-1 [phone]). RN, patient, patient's family, and facility notified of DC. Discharge Summary and FL2 sent to facility. DC packet on chart. Ambulance transport requested for patient.   CSW will sign off for now as social work intervention is no longer needed. Please consult [place] again if new needs arise.     Final next level of care: Skilled Nursing Facility Barriers to Discharge: No Barriers Identified   Patient Goals and CMS Choice Patient states their goals for this hospitalization and ongoing recovery are:: Wants to get better CMS Medicare.gov Compare Post Acute Care list provided to:: Patient Represenative (must comment) (son, Lanny Hurst) Choice offered to / list presented to : Adult Children  Discharge Placement              Patient chooses bed at: [facility] Patient to be transferred to facility by: [place] Name of family member notified: son, Lanny Hurst Patient and family notified of of transfer: 06/15/21  Discharge Plan and Services In-house Referral: Clinical Social Work Discharge Planning Services: CM Consult Post Acute Care Choice: [place]          DME Arranged: N/A         HH Arranged: RN, PT [place] Agency: [place] Date [place]: 06/11/21 Time [place]: 1339 Representative spoke with at [place]: [place] [facility] Interventions     Readmission Risk Interventions    06/07/2021    2:36 PM  Readmission Risk  Prevention Plan  Post Dischage Appt Complete  Transportation Screening Complete

## 2021-06-19 DIAGNOSIS — I255 Ischemic cardiomyopathy: Secondary | ICD-10-CM | POA: Diagnosis not present

## 2021-06-19 DIAGNOSIS — I509 Heart failure, unspecified: Secondary | ICD-10-CM | POA: Diagnosis not present

## 2021-06-19 DIAGNOSIS — I10 Essential (primary) hypertension: Secondary | ICD-10-CM | POA: Diagnosis not present

## 2021-06-19 DIAGNOSIS — R5381 Other malaise: Secondary | ICD-10-CM | POA: Diagnosis not present

## 2021-06-25 DIAGNOSIS — I10 Essential (primary) hypertension: Secondary | ICD-10-CM | POA: Diagnosis not present

## 2021-06-25 DIAGNOSIS — I255 Ischemic cardiomyopathy: Secondary | ICD-10-CM | POA: Diagnosis not present

## 2021-06-25 DIAGNOSIS — I509 Heart failure, unspecified: Secondary | ICD-10-CM | POA: Diagnosis not present

## 2021-06-25 DIAGNOSIS — R5381 Other malaise: Secondary | ICD-10-CM | POA: Diagnosis not present

## 2021-06-28 ENCOUNTER — Ambulatory Visit (HOSPITAL_COMMUNITY)
Admission: RE | Admit: 2021-06-28 | Discharge: 2021-06-28 | Disposition: A | Discharge status: Home / Self Care | Payer: Medicare Other | Source: Ambulatory Visit | Attending: Cardiology | Admitting: Cardiology

## 2021-06-28 ENCOUNTER — Encounter (HOSPITAL_COMMUNITY): Payer: Medicare Other

## 2021-06-28 ENCOUNTER — Encounter (HOSPITAL_COMMUNITY): Payer: Self-pay

## 2021-06-28 VITALS — BP 116/68 | HR 75 | Wt 112.4 lb

## 2021-06-28 DIAGNOSIS — I255 Ischemic cardiomyopathy: Secondary | ICD-10-CM

## 2021-06-28 DIAGNOSIS — I471 Supraventricular tachycardia: Secondary | ICD-10-CM | POA: Diagnosis not present

## 2021-06-28 DIAGNOSIS — I110 Hypertensive heart disease with heart failure: Secondary | ICD-10-CM | POA: Insufficient documentation

## 2021-06-28 DIAGNOSIS — I5022 Chronic systolic (congestive) heart failure: Secondary | ICD-10-CM | POA: Diagnosis not present

## 2021-06-28 DIAGNOSIS — I2510 Atherosclerotic heart disease of native coronary artery without angina pectoris: Secondary | ICD-10-CM

## 2021-06-28 DIAGNOSIS — I10 Essential (primary) hypertension: Secondary | ICD-10-CM | POA: Diagnosis not present

## 2021-06-28 DIAGNOSIS — Z7982 Long term (current) use of aspirin: Secondary | ICD-10-CM | POA: Insufficient documentation

## 2021-06-28 DIAGNOSIS — Z955 Presence of coronary angioplasty implant and graft: Secondary | ICD-10-CM | POA: Diagnosis not present

## 2021-06-28 DIAGNOSIS — E1142 Type 2 diabetes mellitus with diabetic polyneuropathy: Secondary | ICD-10-CM | POA: Diagnosis not present

## 2021-06-28 DIAGNOSIS — K219 Gastro-esophageal reflux disease without esophagitis: Secondary | ICD-10-CM | POA: Insufficient documentation

## 2021-06-28 DIAGNOSIS — Z7902 Long term (current) use of antithrombotics/antiplatelets: Secondary | ICD-10-CM | POA: Insufficient documentation

## 2021-06-28 DIAGNOSIS — Z79899 Other long term (current) drug therapy: Secondary | ICD-10-CM | POA: Insufficient documentation

## 2021-06-28 DIAGNOSIS — Z9861 Coronary angioplasty status: Secondary | ICD-10-CM | POA: Diagnosis not present

## 2021-06-28 DIAGNOSIS — Z7984 Long term (current) use of oral hypoglycemic drugs: Secondary | ICD-10-CM | POA: Insufficient documentation

## 2021-06-28 DIAGNOSIS — R42 Dizziness and giddiness: Secondary | ICD-10-CM | POA: Insufficient documentation

## 2021-06-28 DIAGNOSIS — R5381 Other malaise: Secondary | ICD-10-CM | POA: Diagnosis not present

## 2021-06-28 DIAGNOSIS — E785 Hyperlipidemia, unspecified: Secondary | ICD-10-CM | POA: Diagnosis not present

## 2021-06-28 DIAGNOSIS — I493 Ventricular premature depolarization: Secondary | ICD-10-CM

## 2021-06-28 DIAGNOSIS — I509 Heart failure, unspecified: Secondary | ICD-10-CM | POA: Diagnosis not present

## 2021-06-28 LAB — BASIC METABOLIC PANEL
Anion gap: 12 (ref 5–15)
BUN: 9 mg/dL (ref 8–23)
CO2: 27 mmol/L (ref 22–32)
Calcium: 9.7 mg/dL (ref 8.9–10.3)
Chloride: 95 mmol/L — ABNORMAL LOW (ref 98–111)
Creatinine, Ser: 0.87 mg/dL (ref 0.44–1.00)
GFR, Estimated: >60 mL/min (ref >60)
Glucose, Bld: 128 mg/dL — ABNORMAL HIGH (ref 70–99)
Potassium: 3.8 mmol/L (ref 3.5–5.1)
Sodium: 134 mmol/L — ABNORMAL LOW (ref 135–145)

## 2021-06-28 LAB — BRAIN NATRIURETIC PEPTIDE: B Natriuretic Peptide: 606.8 pg/mL — ABNORMAL HIGH (ref 0.0–100.0)

## 2021-06-28 LAB — DIGOXIN LEVEL: Digoxin Level: 1.2 ng/mL (ref 0.8–2.0)

## 2021-06-28 MED ORDER — METOPROLOL SUCCINATE ER 25 MG PO TB24: 12.5000 mg | ORAL_TABLET | Freq: Every day | ORAL | 5 refills | Status: DC

## 2021-06-28 MED ORDER — ASPIRIN 81 MG PO TBEC: 81.0000 mg | DELAYED_RELEASE_TABLET | Freq: Every day | ORAL | 6 refills | Status: AC

## 2021-06-28 MED ORDER — SPIRONOLACTONE 25 MG PO TABS: 25.0000 mg | ORAL_TABLET | Freq: Every day | ORAL | 6 refills | Status: DC

## 2021-06-28 MED ORDER — LOSARTAN POTASSIUM 25 MG PO TABS: 25.0000 mg | ORAL_TABLET | Freq: Every day | ORAL | 6 refills | Status: DC

## 2021-06-28 MED ORDER — FUROSEMIDE 40 MG PO TABS: 40.0000 mg | ORAL_TABLET | ORAL | 2 refills | Status: DC

## 2021-06-28 MED ORDER — DIGOXIN 125 MCG PO TABS: 0.1250 mg | ORAL_TABLET | Freq: Every day | ORAL | 6 refills | Status: DC

## 2021-06-28 MED ORDER — CLOPIDOGREL BISULFATE 75 MG PO TABS: 75.0000 mg | ORAL_TABLET | Freq: Every day | ORAL | 11 refills | Status: DC

## 2021-06-28 MED ORDER — PANTOPRAZOLE SODIUM 40 MG PO TBEC: 40.0000 mg | DELAYED_RELEASE_TABLET | Freq: Two times a day (BID) | ORAL | 6 refills | Status: AC

## 2021-06-28 MED ORDER — DAPAGLIFLOZIN PROPANEDIOL 10 MG PO TABS: 10.0000 mg | ORAL_TABLET | Freq: Every day | ORAL | 6 refills | Status: DC

## 2021-06-28 NOTE — Addendum Note (Signed)
Encounter addended by: Burna Sis, LCSW on: 06/28/2021 2:51 PM  Actions taken: Flowsheet accepted, Clinical Note Signed

## 2021-06-28 NOTE — Progress Notes (Signed)
Advanced Heart Failure Clinic Note   Referring Physician: PCP: [facility] PCP-Cardiologist: Dr. Aundra Dubin   HPI:  71 y.o. female with history of AVNRT s/p ablation, CAD (mild to moderate disease in LAD on cath 12/2013) HTN, HLD, DM II. Quite functional at baseline but may have some memory issues (daughter said that this is "not bad").  Works as a Programmer, applications full time.    Presented to ED 06/06/21 with complaints of CP, worsening dyspnea, orthopnea, PND and fatigue for about a week. She was admitted for acute systolic CHF. Lactic acid 2.1>1.6,  HS troponin 546>584, BNP 2,924, Scr 1.01, CO2 22, K 5.0, Na 137. ECG sinus tach 110, anterior and inferior Qs.  CTA chest negative for PE, + for small bilateral effusions, extensive coronary calcifications in LAD territory.     Echo: EF 20-25%, RWMA in LAD territory, RV moderately reduced, moderate MR.   [facility] 6/2 showed totally occluded p to m RCA which fills by collaterals from Cx and LAD, Cx okay, 95% p to m LAD, 95% D1, RA mean 14, PA 40/18 (28), PCWP mean 23, LVEDP 27, Fick CO/CI 3.84/2.22.    She was considered for CABG. CT surgery team was consulted. AHF team was also consulted post cath. She was diuresed further w/ IV Lasix. GDMT initiated. cMRI was done for viability study. Delayed enhancement imaging suggested minimal viability in the basal to mid inferior and inferoseptal walls. The mid to apical anteroseptal and anterior walls were borderline in terms of expected viability with revascularization, with 50% wall thickness LGE. LVEF 23%, RVEF 24%, mod MR.   Reviewed cMRI with Dr. Cyndia Bent. With severe RV dysfunction, borderline viability, and some question of early dementia, we decided that she would likely be best served by PCI rather than CABG.  On 6/7, she had successful PCI to LAD. Tolerated procedure well w/o complication. Placed on DAPT w/ ASA + Plavix and high intensity statin.    HF further optimized. Diuresed and  transitioned to PO diuretics. GDMT further titrated. D/c to SNF. D/c wt 119 lb.   Presents today for post hospital f/u. Here w/ daughter. Was just discharged home from SNF today. Denies ischemic CP but has occasional heartburn, relieved w/ Mylanta and Tums. Denies exertional dyspnea walking on flat surfaces. Wt down 7 lb since d/c at 112 lb. ReDs Clip 29%. BP 116/68. Reports occasional dizziness if she stands too quickly.  She wants to go back to work and worried about finances.   Discussed medication plan w/ daughter, who lives 15 min away from her. They plan to help organize pills in pill box weekly for her.    Review of Systems: [y] = yes, [ ]  = no   General: Weight gain [ ] ; Weight loss [Y ]; Anorexia [ ] ; Fatigue [ ] ; Fever [ ] ; Chills [ ] ; Weakness [ ]   Cardiac: Chest pain/pressure [ ] ; Resting SOB [ ] ; Exertional SOB [ ] ; Orthopnea [ ] ; Pedal Edema [ ] ; Palpitations [ ] ; Syncope [ ] ; Presyncope [ ] ; Paroxysmal nocturnal dyspnea[ ]   Pulmonary: Cough [ ] ; Wheezing[ ] ; Hemoptysis[ ] ; Sputum [ ] ; Snoring [ ]   GI: Vomiting[ ] ; Dysphagia[ ] ; Melena[ ] ; Hematochezia [ ] ; Heartburn[ Y]; Abdominal pain [ ] ; Constipation [ ] ; Diarrhea [ ] ; BRBPR [ ]   GU: Hematuria[ ] ; Dysuria [ ] ; Nocturia[ ]   Vascular: Pain in legs with walking [ ] ; Pain in feet with lying flat [ ] ; Non-healing sores [ ] ; Stroke [ ] ;  TIA [ ] ; Slurred speech [ ] ;  Neuro: Headaches[ ] ; Vertigo[ ] ; Seizures[ ] ; Paresthesias[ ] ;Blurred vision [ ] ; Diplopia [ ] ; Vision changes [ ]   Ortho/Skin: Arthritis [ ] ; Joint pain [ ] ; Muscle pain [ ] ; Joint swelling [ ] ; Back Pain [ ] ; Rash [ ]   Psych: Depression[ ] ; Anxiety[ ]   Heme: Bleeding problems [ ] ; Clotting disorders [ ] ; Anemia [ ]   Endocrine: Diabetes [ y]; Thyroid dysfunction[ ]    Past Medical History:  Diagnosis Date   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 Donnel Saxon in 2004   CAD (coronary artery disease)    80% stenosis in moderate size diagonal  on cath in 2004. with last adenosine Cardiolite study in 2007 showing no abnormalities   Depression    Diabetes mellitus    Dyslipidemia    GERD (gastroesophageal reflux disease)    Hyperlipidemia    Hypertension    Mitral valve prolapse    Osteoarthritis    Peripheral neuropathy    Renal insufficiency     Current Outpatient Medications  Medication Sig Dispense Refill   ascorbic acid (VITAMIN C) 500 MG tablet Take 1 tablet by mouth daily.     aspirin EC 81 MG tablet Take 1 tablet (81 mg total) by mouth daily. 30 tablet 11   atorvastatin (LIPITOR) 80 MG tablet Take 1 tablet (80 mg total) by mouth at bedtime. 30 tablet 5   Cholecalciferol (VITAMIN D3) 5000 UNITS CAPS Take 2,000 Units by mouth daily.     clopidogrel (PLAVIX) 75 MG tablet Take 1 tablet (75 mg total) by mouth daily. 30 tablet 11   dapagliflozin propanediol (FARXIGA) 10 MG TABS tablet Take 1 tablet (10 mg total) by mouth daily. 30 tablet 5   digoxin (LANOXIN) 0.125 MG tablet Take 1 tablet (0.125 mg total) by mouth daily. 30 tablet 5   furosemide (LASIX) 40 MG tablet Take 1 tablet (40 mg total) by mouth daily. 30 tablet 5   losartan (COZAAR) 25 MG tablet Take 1/2 tablet (12.5 mg total) by mouth at bedtime. 15 tablet 5   metFORMIN (GLUCOPHAGE-XR) 500 MG 24 hr tablet Take 1 tablet (500 mg total) by mouth 2 (two) times daily.     metoprolol succinate (TOPROL-XL) 25 MG 24 hr tablet Take 1/2 tablet (12.5 mg total) by mouth daily. 30 tablet 5   Multiple Vitamins-Minerals (CENTRUM SILVER PO) Take 1 tablet by mouth daily.       nitroGLYCERIN (NITROSTAT) 0.4 MG SL tablet Place 1 tablet (0.4 mg total) under the tongue every 5 (five) minutes as needed for chest pain. 25 tablet 2   pantoprazole (PROTONIX) 40 MG tablet Take 1 tablet (40 mg total) by mouth daily. 30 tablet 3   spironolactone (ALDACTONE) 25 MG tablet Take 1 tablet (25 mg total) by mouth daily. 30 tablet 5   vitamin E 200 UNIT capsule Take by mouth.      clotrimazole-betamethasone (LOTRISONE) cream Apply 1 application. topically 2 (two) times daily.     estradiol (ESTRACE) 0.1 MG/GM vaginal cream Place vaginally.     Methenamine-Sodium Salicylate (AZO URINARY TRACT DEFENSE PO) Take 2 capsules by mouth daily.     No current facility-administered medications for this encounter.    Allergies  Allergen Reactions   Latex     Itching and rash      Social History   Socioeconomic History   Marital status: Divorced    Spouse name: Not on file  Number of children: Not on file   Years of education: Not on file   Highest education level: Not on file  Occupational History   Not on file  Tobacco Use   Smoking status: Former    Packs/day: 0.25    Years: 21.00    Total pack years: 5.25    Types: Cigarettes    Start date: 06/22/1966    Quit date: 07/23/1987    Years since quitting: 33.9   Smokeless tobacco: Never  Substance and Sexual Activity   Alcohol use: No    Alcohol/week: 0.0 standard drinks of alcohol   Drug use: No   Sexual activity: Not on file  Other Topics Concern   Not on file  Social History Narrative   Has one son and one daughter.   Has five grandchildren.   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 3 brothers died with cancer.   Social Determinants of Health   Financial Resource Strain: Not on file  Food Insecurity: Not on file  Transportation Needs: Not on file  Physical Activity: Not on file  Stress: Not on file  Social Connections: Not on file  Intimate Partner Violence: Not on file      Family History  Problem Relation Age of Onset   Cancer Father    Pneumonia Mother    Cancer Brother        3 brothers died from cancer    Vitals:   Jul 19, 2021 1345  BP: 116/68  Pulse: 75  SpO2: 100%  Weight: 51 kg (112 lb 6.4 oz)     PHYSICAL EXAM: ReDs Clip 29%  General:  thin elderly AAF. No respiratory difficulty HEENT: normal Neck: supple. no JVD. Carotids 2+ bilat; no bruits.  No lymphadenopathy or thyromegaly appreciated. Cor: PMI nondisplaced. Regular rate & rhythm. No rubs, gallops or murmurs. Lungs: clear Abdomen: soft, nontender, nondistended. No hepatosplenomegaly. No bruits or masses. Good bowel sounds. Extremities: no cyanosis, clubbing, rash, edema Neuro: alert & oriented x 3, cranial nerves grossly intact. moves all 4 extremities w/o difficulty. Affect pleasant.  ECG: NSR 74 bpm    ASSESSMENT & PLAN:  1. Chronic systolic CHF: Ischemic cardiomyopathy.  Echo 6/23 EF 20-25% with moderate RV dysfunction and moderate MR, dilated IVC. In Setting of MV CAD s/p LAD PCI (poor candidate for CABG). RHC on 6/2 showed elevated filling pressures with CI 2.22. cMRI LVEF 23%, RVEF 24%, delayed enhancement imaging suggested minimal viability in the basal to mid inferior and inferoseptal walls. The mid to apical anteroseptal and anterior walls were borderline in terms of expected viability with revascularization.  - NYHA Class II. Euvolemic on exam. Wt down 7 lb post hospital. ReDs Clip 29% - Reduce Lasix to 40 mg 2 days a week, Monday and Fridays - Increase Losartan to 25 mg daily. If BP able to tolerate, plan transition to [facility] next visit  - Continue digoxin 0.125 daily. Check Dig level  - Continue spironolactone 25 daily.  - Continue Farxiga 10 daily.  - Continue Toprol XL 12.5 mg daily.  - Check BMP today  - Instructed to check wt daily at home.  2. CAD: Recent admit 6/23 w/ CHF but had anginal-type symptoms as well.  HS-TnI at admission did not suggest ACS at the time of admission.  Inferior and anterior Qs suggested out of hospital MI.  Cath on 6/2 showed severe proximal to mid LAD and proximal D1 disease as well as occluded RCA with  collaterals.  Given severe RV dysfunction, borderline viability based on cMRI, and some question of early dementia, we decided that she would likely be best served by PCI rather than CABG.  On 6/7, she had PCI to LAD. Stable w/o  ischemic CP. Has acid reflux relieved w/ antacids   - Continue ASA 81 mg daily  - Continue Plavix 75 daily.  - atorvastatin 80 mg daily. LDL goal < 70. Check FLP and HFTs in 6 more weeks  - increase Protonix to 40 mg bid x 4 wks  3. Type 2 diabetes:  Hgb A1c 7.6.  - On Farxiga.  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  - denies palpitations, NSR on EKG - continue ? blocker  Advised to stay out of work for the next 4 weeks. SW team to see and assist w/ financial needs. D/w daughter need for strict med compliance, particularly w/ DAPT in the setting of recent stent placement.   F/u in 2-3 weeks w/ APP for further med titration. Repeat echo in 3 months     Robbie Lis, PA-C 06/28/21

## 2021-06-28 NOTE — Progress Notes (Signed)
  Heart and Vascular Care Navigation  06/28/2021  SEYNABOU FULTS 01/20/1950 [id number]  Reason for Referral: financial concerns   Engaged with patient face to face for initial visit for Heart and Vascular Care Coordination.                                                                                                   Assessment:      CSW consulted to meet with pt to discuss financial concerns.  Pt being asked not to work for at least a month so she is worried about finances at this time.  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   Is unsure if she can get STD but will call work to discuss.  Has no immediate bills in need of assistance at this time but CSW discussed ability to refer to patient care fund for bills to help her get through period where she can't work- informed her what she would need to bring in and had her sign grand acknowledgement just in case.                                HRT/VAS Care Coordination     Living arrangements for the past 2 months Single Family Home   Lives with: Self   Home Assistive Devices/Equipment Eyeglasses   [facility]   Current home services --  n/a       Social History:                                                                             SDOH Screenings   Alcohol Screen: Not on file  Depression (PHQ2-9): Not on file  Financial Resource Strain: Not on file  Food Insecurity: Not on file  Housing: Low Risk  (06/28/2021)   Housing    Last Housing Risk Score: 0  Physical Activity: Not on file  Social Connections: Not on file  Stress: Not on file  Tobacco Use: Medium Risk (06/28/2021)   Patient History    Smoking Tobacco Use: Former    Smokeless Tobacco Use: Never    Passive Exposure: Not on file  Transportation Needs: Not on file    Follow-up plan:    Pt to call CSW if she has need of  assistance with future bills  Burna Sis, LCSW Clinical Social Worker Advanced [facility] Clinic Desk#: [phone] Cell#: [phone]

## 2021-06-28 NOTE — Progress Notes (Signed)
ReDS Vest / Clip - 06/28/21 1400       ReDS Vest / Clip   Station Marker C    Ruler Value 24    ReDS Value Range Low volume    ReDS Actual Value 29    Anatomical Comments sitting

## 2021-06-28 NOTE — Patient Instructions (Signed)
Thank you for coming in today  Labs were done today, if any labs are abnormal the clinic will call you No news is good news    Change Lasix 40 mg twice weekly Mondays and Fridays  INCREASE Losartan to 25 mg daily  INCREASE Protonix to 40 mg twice a day for 4 weeks   Your physician recommends that you schedule a follow-up appointment in:  2-3 weeks in clinic  At the [facility], you and your health needs are our priority. As part of our continuing mission to provide you with exceptional heart care, we have created designated Provider Care Teams. These Care Teams include your primary Cardiologist (physician) and Advanced Practice Providers (APPs- Physician Assistants and Nurse Practitioners) who all work together to provide you with the care you need, when you need it.   You may see any of the following providers on your designated Care Team at your next follow up: Dr Arvilla Meres Dr Carron Curie, NP Robbie Lis, [place] [facility] [place], [place] Karle Plumber, PharmD   Please be sure to bring in all your medications bottles to every appointment.   If you have any questions or concerns before your next appointment please send [place] a message through [place] or call our office at [phone].    TO LEAVE A MESSAGE FOR THE NURSE SELECT OPTION 2, PLEASE LEAVE A MESSAGE INCLUDING: YOUR NAME DATE OF BIRTH CALL BACK NUMBER REASON FOR CALL**this is important as we prioritize the call backs  YOU WILL RECEIVE A CALL BACK THE SAME DAY AS LONG AS YOU CALL BEFORE 4:00 PM

## 2021-07-03 DIAGNOSIS — I110 Hypertensive heart disease with heart failure: Secondary | ICD-10-CM | POA: Diagnosis not present

## 2021-07-03 DIAGNOSIS — Z7902 Long term (current) use of antithrombotics/antiplatelets: Secondary | ICD-10-CM | POA: Diagnosis not present

## 2021-07-03 DIAGNOSIS — Z7984 Long term (current) use of oral hypoglycemic drugs: Secondary | ICD-10-CM | POA: Diagnosis not present

## 2021-07-03 DIAGNOSIS — I493 Ventricular premature depolarization: Secondary | ICD-10-CM | POA: Diagnosis not present

## 2021-07-03 DIAGNOSIS — Z7982 Long term (current) use of aspirin: Secondary | ICD-10-CM | POA: Diagnosis not present

## 2021-07-03 DIAGNOSIS — Z955 Presence of coronary angioplasty implant and graft: Secondary | ICD-10-CM | POA: Diagnosis not present

## 2021-07-03 DIAGNOSIS — E785 Hyperlipidemia, unspecified: Secondary | ICD-10-CM | POA: Diagnosis not present

## 2021-07-03 DIAGNOSIS — I5023 Acute on chronic systolic (congestive) heart failure: Secondary | ICD-10-CM | POA: Diagnosis not present

## 2021-07-03 DIAGNOSIS — Z9181 History of falling: Secondary | ICD-10-CM | POA: Diagnosis not present

## 2021-07-03 DIAGNOSIS — E119 Type 2 diabetes mellitus without complications: Secondary | ICD-10-CM | POA: Diagnosis not present

## 2021-07-03 DIAGNOSIS — I255 Ischemic cardiomyopathy: Secondary | ICD-10-CM | POA: Diagnosis not present

## 2021-07-03 DIAGNOSIS — E46 Unspecified protein-calorie malnutrition: Secondary | ICD-10-CM | POA: Diagnosis not present

## 2021-07-05 DIAGNOSIS — I255 Ischemic cardiomyopathy: Secondary | ICD-10-CM | POA: Diagnosis not present

## 2021-07-05 DIAGNOSIS — I5023 Acute on chronic systolic (congestive) heart failure: Secondary | ICD-10-CM | POA: Diagnosis not present

## 2021-07-05 DIAGNOSIS — I110 Hypertensive heart disease with heart failure: Secondary | ICD-10-CM | POA: Diagnosis not present

## 2021-07-05 DIAGNOSIS — E46 Unspecified protein-calorie malnutrition: Secondary | ICD-10-CM | POA: Diagnosis not present

## 2021-07-05 DIAGNOSIS — E785 Hyperlipidemia, unspecified: Secondary | ICD-10-CM | POA: Diagnosis not present

## 2021-07-05 DIAGNOSIS — E119 Type 2 diabetes mellitus without complications: Secondary | ICD-10-CM | POA: Diagnosis not present

## 2021-07-11 DIAGNOSIS — E785 Hyperlipidemia, unspecified: Secondary | ICD-10-CM | POA: Diagnosis not present

## 2021-07-11 DIAGNOSIS — E46 Unspecified protein-calorie malnutrition: Secondary | ICD-10-CM | POA: Diagnosis not present

## 2021-07-11 DIAGNOSIS — I5023 Acute on chronic systolic (congestive) heart failure: Secondary | ICD-10-CM | POA: Diagnosis not present

## 2021-07-11 DIAGNOSIS — I255 Ischemic cardiomyopathy: Secondary | ICD-10-CM | POA: Diagnosis not present

## 2021-07-11 DIAGNOSIS — E119 Type 2 diabetes mellitus without complications: Secondary | ICD-10-CM | POA: Diagnosis not present

## 2021-07-11 DIAGNOSIS — I110 Hypertensive heart disease with heart failure: Secondary | ICD-10-CM | POA: Diagnosis not present

## 2021-07-18 DIAGNOSIS — I110 Hypertensive heart disease with heart failure: Secondary | ICD-10-CM | POA: Diagnosis not present

## 2021-07-18 DIAGNOSIS — E46 Unspecified protein-calorie malnutrition: Secondary | ICD-10-CM | POA: Diagnosis not present

## 2021-07-18 DIAGNOSIS — I255 Ischemic cardiomyopathy: Secondary | ICD-10-CM | POA: Diagnosis not present

## 2021-07-18 DIAGNOSIS — E119 Type 2 diabetes mellitus without complications: Secondary | ICD-10-CM | POA: Diagnosis not present

## 2021-07-18 DIAGNOSIS — E785 Hyperlipidemia, unspecified: Secondary | ICD-10-CM | POA: Diagnosis not present

## 2021-07-18 DIAGNOSIS — I5023 Acute on chronic systolic (congestive) heart failure: Secondary | ICD-10-CM | POA: Diagnosis not present

## 2021-07-20 ENCOUNTER — Ambulatory Visit (HOSPITAL_COMMUNITY)
Admission: RE | Admit: 2021-07-20 | Discharge: 2021-07-20 | Disposition: A | Discharge status: Home / Self Care | Payer: Medicare Other | Source: Ambulatory Visit | Attending: Family Medicine | Admitting: Family Medicine

## 2021-07-20 ENCOUNTER — Encounter (HOSPITAL_COMMUNITY): Payer: Self-pay

## 2021-07-20 ENCOUNTER — Telehealth: Payer: Self-pay | Admitting: Physician Assistant

## 2021-07-20 VITALS — BP 126/68 | HR 93 | Wt 118.6 lb

## 2021-07-20 DIAGNOSIS — I255 Ischemic cardiomyopathy: Secondary | ICD-10-CM | POA: Diagnosis not present

## 2021-07-20 DIAGNOSIS — Z79899 Other long term (current) drug therapy: Secondary | ICD-10-CM | POA: Insufficient documentation

## 2021-07-20 DIAGNOSIS — I5023 Acute on chronic systolic (congestive) heart failure: Secondary | ICD-10-CM | POA: Diagnosis not present

## 2021-07-20 DIAGNOSIS — F0390 Unspecified dementia without behavioral disturbance: Secondary | ICD-10-CM | POA: Diagnosis not present

## 2021-07-20 DIAGNOSIS — I5022 Chronic systolic (congestive) heart failure: Secondary | ICD-10-CM

## 2021-07-20 DIAGNOSIS — I2510 Atherosclerotic heart disease of native coronary artery without angina pectoris: Secondary | ICD-10-CM | POA: Diagnosis not present

## 2021-07-20 DIAGNOSIS — I471 Supraventricular tachycardia: Secondary | ICD-10-CM

## 2021-07-20 DIAGNOSIS — I110 Hypertensive heart disease with heart failure: Secondary | ICD-10-CM | POA: Diagnosis not present

## 2021-07-20 DIAGNOSIS — Z7902 Long term (current) use of antithrombotics/antiplatelets: Secondary | ICD-10-CM | POA: Insufficient documentation

## 2021-07-20 DIAGNOSIS — Z9861 Coronary angioplasty status: Secondary | ICD-10-CM | POA: Diagnosis not present

## 2021-07-20 DIAGNOSIS — Z7982 Long term (current) use of aspirin: Secondary | ICD-10-CM | POA: Diagnosis not present

## 2021-07-20 DIAGNOSIS — E1142 Type 2 diabetes mellitus with diabetic polyneuropathy: Secondary | ICD-10-CM | POA: Insufficient documentation

## 2021-07-20 DIAGNOSIS — E785 Hyperlipidemia, unspecified: Secondary | ICD-10-CM | POA: Diagnosis not present

## 2021-07-20 DIAGNOSIS — Z955 Presence of coronary angioplasty implant and graft: Secondary | ICD-10-CM | POA: Insufficient documentation

## 2021-07-20 DIAGNOSIS — E119 Type 2 diabetes mellitus without complications: Secondary | ICD-10-CM

## 2021-07-20 LAB — BASIC METABOLIC PANEL
Anion gap: 11 (ref 5–15)
BUN: 5 mg/dL — ABNORMAL LOW (ref 8–23)
CO2: 26 mmol/L (ref 22–32)
Calcium: 9.7 mg/dL (ref 8.9–10.3)
Chloride: 104 mmol/L (ref 98–111)
Creatinine, Ser: 0.63 mg/dL (ref 0.44–1.00)
GFR, Estimated: >60 mL/min (ref >60)
Glucose, Bld: 96 mg/dL (ref 70–99)
Potassium: 4 mmol/L (ref 3.5–5.1)
Sodium: 141 mmol/L (ref 135–145)

## 2021-07-20 LAB — DIGOXIN LEVEL: Digoxin Level: <0.2 ng/mL — ABNORMAL LOW (ref 0.8–2.0)

## 2021-07-20 MED ORDER — DAPAGLIFLOZIN PROPANEDIOL 10 MG PO TABS: 10.0000 mg | ORAL_TABLET | Freq: Every day | ORAL | 6 refills | Status: DC

## 2021-07-20 MED ORDER — NITROGLYCERIN 0.4 MG SL SUBL: 0.4000 mg | SUBLINGUAL_TABLET | SUBLINGUAL | 2 refills | Status: DC | PRN

## 2021-07-20 MED ORDER — FUROSEMIDE 40 MG PO TABS: 40.0000 mg | ORAL_TABLET | ORAL | 2 refills | Status: DC

## 2021-07-20 NOTE — Patient Instructions (Addendum)
Thank you for coming in today  Labs were done today, if any labs are abnormal the clinic will call you No news is good news  Your physician recommends that you schedule a follow-up appointment in:  1-2 weeks in clinic  3 months with Dr. Shirlee Latch with echocardiogram  Your physician has requested that you have an echocardiogram. Echocardiography is a painless test that uses sound waves to create images of your heart. It provides your doctor with information about the size and shape of your heart and how well your heart's chambers and valves are working. This procedure takes approximately one hour. There are no restrictions for this procedure.   PLEASE CALL WHEN YOU GET HOME TO GO OVER YOUR MEDICATIONS  At the [facility], you and your health needs are our priority. As part of our continuing mission to provide you with exceptional heart care, we have created designated Provider Care Teams. These Care Teams include your primary Cardiologist (physician) and Advanced Practice Providers (APPs- Physician Assistants and Nurse Practitioners) who all work together to provide you with the care you need, when you need it.   You may see any of the following providers on your designated Care Team at your next follow up: Dr Arvilla Meres Dr Carron Curie, NP Robbie Lis, [place] [facility] [place], [place] Karle Plumber, PharmD   Please be sure to bring in all your medications bottles to every appointment.   If you have any questions or concerns before your next appointment please send [place] a message through [place] or call our office at [phone].    TO LEAVE A MESSAGE FOR THE NURSE SELECT OPTION 2, PLEASE LEAVE A MESSAGE INCLUDING: YOUR NAME DATE OF BIRTH CALL BACK NUMBER REASON FOR CALL**this is important as we prioritize the call backs  YOU WILL RECEIVE A CALL BACK THE SAME DAY AS LONG AS YOU CALL BEFORE 4:00 PM

## 2021-07-20 NOTE — Progress Notes (Addendum)
Advanced Heart Failure Clinic Note   PCP: [facility] Cardiologist: Dr. Shirlee Latch   HPI: Shelby Trevino is a 71 y.o. female with history of AVNRT s/p ablation, CAD (mild to moderate disease in LAD on cath 12/2013) HTN, HLD, DM II. Quite functional at baseline but may have some memory issues (daughter said that this is "not bad").  Works as a Water engineer full time.    Admitted 5/23 with acute systolic CHF. Echo: EF 20-25%, RWMA in LAD territory, RV moderately reduced, moderate MR. Underwent R/LHC showing totally occluded p to m RCA which fills by collaterals from Cx and LAD, Cx okay, 95% p to m LAD, 95% D1, RA mean 14, PA 40/18 (28), PCWP mean 23, LVEDP 27, Fick CO/CI 3.84/2.22. CT surgery and AHF consulted. She was diuresed further w/ IV Lasix. GDMT initiated. cMRI showed delayed enhancement imaging suggested minimal viability in the basal to mid inferior and inferoseptal walls. The mid to apical anteroseptal and anterior walls were borderline in terms of expected viability with revascularization, with 50% wall thickness LGE. LVEF 23%, RVEF 24%, mod MR. cMRI reviewed with Dr. Laneta Simmers. With severe RV dysfunction, borderline viability, and some question of early dementia, decided she would be best served by PCI rather than CABG. She underwent successful PCI to LAD. Placed on DAPT w/ ASA + Plavix and high intensity statin. GDMT titrated and discharged to SNF, weight 119 lb.   Post hospital HF follow up 6/23, recently discharged from SNF. ReDs 29%, Lasix reduced and losartan increased.  Today she returns for HF follow up with her daughter. She does not know what medications she is on and she did not bring her med list. Overall feeling fine. She has dyspnea if she hurries or pushes herself. Asking when she can go back to work. Denies palpitations, CP, dizziness, edema, or PND/Orthopnea. Appetite ok. No fever or chills. Weight at home 108-110 pounds. Lives in [place].  ReDs:  33%  Labs (6/23): K 3.8, creatinine 0.87, digoxin 1.2  ROS: All systems reviewed and negative except as per HPI.   Past Medical History:  Diagnosis Date   AV nodal tachycardia (HCC) 2004   with radiofrequency ablation by Dr. Grant Fontana in 2004   CAD (coronary artery disease)    80% stenosis in moderate size diagonal on cath in 2004. with last adenosine Cardiolite study in 2007 showing no abnormalities   Depression    Diabetes mellitus    Dyslipidemia    GERD (gastroesophageal reflux disease)    Hyperlipidemia    Hypertension    Mitral valve prolapse    Osteoarthritis    Peripheral neuropathy    Renal insufficiency    Current Outpatient Medications  Medication Sig Dispense Refill   aspirin EC 81 MG tablet Take 1 tablet (81 mg total) by mouth daily. 30 tablet 6   Multiple Vitamins-Minerals (CENTRUM SILVER PO) Take 1 tablet by mouth daily.       ascorbic acid (VITAMIN C) 500 MG tablet Take 1 tablet by mouth daily.     atorvastatin (LIPITOR) 80 MG tablet Take 1 tablet (80 mg total) by mouth at bedtime. 30 tablet 5   Cholecalciferol (VITAMIN D3) 5000 UNITS CAPS Take 2,000 Units by mouth daily.     clopidogrel (PLAVIX) 75 MG tablet Take 1 tablet (75 mg total) by mouth daily. 30 tablet 11   clotrimazole-betamethasone (LOTRISONE) cream Apply 1 application. topically 2 (two) times daily.     dapagliflozin propanediol (FARXIGA) 10 MG TABS  tablet Take 1 tablet (10 mg total) by mouth daily. 30 tablet 6   digoxin (LANOXIN) 0.125 MG tablet Take 1 tablet (0.125 mg total) by mouth daily. 30 tablet 6   estradiol (ESTRACE) 0.1 MG/GM vaginal cream Place vaginally.     furosemide (LASIX) 40 MG tablet Take 1 tablet (40 mg total) by mouth 2 (two) times a week. Mondays and Fridays 30 tablet 2   losartan (COZAAR) 25 MG tablet Take 1 tablet (25 mg total) by mouth at bedtime. 30 tablet 6   metFORMIN (GLUCOPHAGE-XR) 500 MG 24 hr tablet Take 1 tablet (500 mg total) by mouth 2 (two) times daily.      Methenamine-Sodium Salicylate (AZO URINARY TRACT DEFENSE PO) Take 2 capsules by mouth daily.     metoprolol succinate (TOPROL-XL) 25 MG 24 hr tablet Take 1/2 tablet (12.5 mg total) by mouth daily. 30 tablet 5   nitroGLYCERIN (NITROSTAT) 0.4 MG SL tablet Place 1 tablet (0.4 mg total) under the tongue every 5 (five) minutes as needed for chest pain. 25 tablet 2   pantoprazole (PROTONIX) 40 MG tablet Take 1 tablet (40 mg total) by mouth 2 (two) times daily. 40 mg 1 tablet twice a day 60 tablet 6   spironolactone (ALDACTONE) 25 MG tablet Take 1 tablet (25 mg total) by mouth daily. 30 tablet 6   vitamin E 200 UNIT capsule Take by mouth.     No current facility-administered medications for this encounter.   Allergies  Allergen Reactions   Latex     Itching and rash   Social History   Socioeconomic History   Marital status: Divorced    Spouse name: Not on file   Number of children: Not on file   Years of education: Not on file   Highest education level: Not on file  Occupational History   Not on file  Tobacco Use   Smoking status: Former    Packs/day: 0.25    Years: 21.00    Total pack years: 5.25    Types: Cigarettes    Start date: 06/22/1966    Quit date: 07/23/1987    Years since quitting: 34.0   Smokeless tobacco: Never  Substance and Sexual Activity   Alcohol use: No    Alcohol/week: 0.0 standard drinks of alcohol   Drug use: No   Sexual activity: Not on file  Other Topics Concern   Not on file  Social History Narrative   Has one son and one daughter.   Has five grandchildren.   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 3 brothers died with cancer.   Social Determinants of Health   Financial Resource Strain: Medium Risk (06/28/2021)   Overall Financial Resource Strain (CARDIA)    Difficulty of Paying Living Expenses: Somewhat hard  Food Insecurity: Not on file  Transportation Needs: Not on file  Physical Activity: Not on file  Stress: Not on  file  Social Connections: Not on file  Intimate Partner Violence: Not on file   Family History  Problem Relation Age of Onset   Cancer Father    Pneumonia Mother    Cancer Brother        3 brothers died from cancer   Wt Readings from Last 3 Encounters:  07/20/21 53.8 kg (118 lb 9.6 oz)  06/28/21 51 kg (112 lb 6.4 oz)  06/15/21 54 kg (119 lb 0.8 oz)   BP 126/68   Pulse 93   Wt 53.8 kg (  118 lb 9.6 oz)   SpO2 99%   BMI 18.58 kg/m   PHYSICAL EXAM: General:  NAD. No resp difficulty, elderly, walked into clinic HEENT: Normal Neck: Supple. No JVD. Carotids 2+ bilat; no bruits. No lymphadenopathy or thryomegaly appreciated. Cor: PMI nondisplaced. Regular rate & rhythm. No rubs, gallops or murmurs. Lungs: Clear Abdomen: Soft, nontender, nondistended. No hepatosplenomegaly. No bruits or masses. Good bowel sounds. Extremities: No cyanosis, clubbing, rash, edema Neuro: Alert & oriented x 3, cranial nerves grossly intact. Moves all 4 extremities w/o difficulty. Affect pleasant. + mild confusion.  ASSESSMENT & PLAN: 1. Chronic systolic CHF: Ischemic cardiomyopathy.  Echo 6/23 EF 20-25% with moderate RV dysfunction and moderate MR, dilated IVC. In Setting of MV CAD s/p LAD PCI (poor candidate for CABG). RHC on 6/2 showed elevated filling pressures with CI 2.22. cMRI LVEF 23%, RVEF 24%, delayed enhancement imaging suggested minimal viability in the basal to mid inferior and inferoseptal walls. The mid to apical anteroseptal and anterior walls were borderline in terms of expected viability with revascularization. Stable NYHA II, she is not volume overloaded on exam, ReDs 33%. She does not know what medications she is taking and does not recognize them by name when we discussed today. I will no make any changes today. Daughter is agreeable to managing her pillboxes. - Continue losartan 25 mg daily. - Continue Lasix 40 mg on Mondays and Fridays. - Off digoxin with dig level 1.2. She says she has  stopped digoxin, but will repeat dig level today as she appears confused about most of her medications. - Continue spironolactone 25 mg daily.  - Continue Farxiga 10 mg daily.  - Continue Toprol XL 12.5 mg daily.  2. CAD: Cath showed severe proximal to mid LAD and proximal D1 disease as well as occluded RCA with collaterals.  Given severe RV dysfunction, borderline viability based on cMRI, and some question of early dementia, decided she would be best served by PCI, rather than CABG. She had PCI to LAD. Denies CP. - Continue ASA 81 mg daily.  - Continue Plavix 75 mg daily.  - Continue atorvastatin 80 mg daily. LDL goal < 70. Check lipids and HFTs at next lab appt. 3. Type 2 diabetes:  Hgb A1c 7.6.  - Continue Farxiga. No GU symptoms. 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  - Denies palpitations, NSR on recent ECG. - Continue ? blocker. 5. Dementia: Seems significant. I do not think she can manage her medications by herself. Patient lives in [place] so not a candidate for paramedicine. - May need HH aide. Daughter is willing to set up pillboxes for now.  Follow up in 1-2 weeks with APP (check LFTs and lipids), and 3 months with Dr. Shirlee Latch + echo.  I asked patient to call when she get home to reconcile medications.  Anderson Malta March ARB, FNP 07/20/21

## 2021-07-20 NOTE — Telephone Encounter (Signed)
Med refilled.

## 2021-07-20 NOTE — Progress Notes (Signed)
ReDS Vest / Clip - 07/20/21 1400       ReDS Vest / Clip   Station Marker C    Ruler Value 23    ReDS Value Range Low volume    ReDS Actual Value 33

## 2021-07-25 DIAGNOSIS — E119 Type 2 diabetes mellitus without complications: Secondary | ICD-10-CM | POA: Diagnosis not present

## 2021-07-25 DIAGNOSIS — E785 Hyperlipidemia, unspecified: Secondary | ICD-10-CM | POA: Diagnosis not present

## 2021-07-25 DIAGNOSIS — I5023 Acute on chronic systolic (congestive) heart failure: Secondary | ICD-10-CM | POA: Diagnosis not present

## 2021-07-25 DIAGNOSIS — E46 Unspecified protein-calorie malnutrition: Secondary | ICD-10-CM | POA: Diagnosis not present

## 2021-07-25 DIAGNOSIS — Z4689 Encounter for fitting and adjustment of other specified devices: Secondary | ICD-10-CM | POA: Diagnosis not present

## 2021-07-25 DIAGNOSIS — I255 Ischemic cardiomyopathy: Secondary | ICD-10-CM | POA: Diagnosis not present

## 2021-07-25 DIAGNOSIS — I110 Hypertensive heart disease with heart failure: Secondary | ICD-10-CM | POA: Diagnosis not present

## 2021-07-27 DIAGNOSIS — I255 Ischemic cardiomyopathy: Secondary | ICD-10-CM | POA: Diagnosis not present

## 2021-07-27 DIAGNOSIS — I5022 Chronic systolic (congestive) heart failure: Secondary | ICD-10-CM | POA: Diagnosis not present

## 2021-07-27 DIAGNOSIS — I2510 Atherosclerotic heart disease of native coronary artery without angina pectoris: Secondary | ICD-10-CM | POA: Diagnosis not present

## 2021-07-27 DIAGNOSIS — I471 Supraventricular tachycardia: Secondary | ICD-10-CM | POA: Diagnosis not present

## 2021-07-27 DIAGNOSIS — E1159 Type 2 diabetes mellitus with other circulatory complications: Secondary | ICD-10-CM | POA: Diagnosis not present

## 2021-08-06 ENCOUNTER — Encounter (HOSPITAL_COMMUNITY): Payer: Medicare Other

## 2021-08-09 DIAGNOSIS — Z4689 Encounter for fitting and adjustment of other specified devices: Secondary | ICD-10-CM | POA: Diagnosis not present

## 2021-09-06 DIAGNOSIS — Z4689 Encounter for fitting and adjustment of other specified devices: Secondary | ICD-10-CM | POA: Diagnosis not present

## 2021-09-07 ENCOUNTER — Encounter (HOSPITAL_COMMUNITY): Payer: Self-pay

## 2021-09-07 ENCOUNTER — Ambulatory Visit (HOSPITAL_COMMUNITY)
Admission: RE | Admit: 2021-09-07 | Discharge: 2021-09-07 | Disposition: A | Discharge status: Home / Self Care | Payer: Medicare Other | Source: Ambulatory Visit | Attending: Family Medicine | Admitting: Family Medicine

## 2021-09-07 VITALS — BP 128/70 | HR 78 | Wt 112.4 lb

## 2021-09-07 DIAGNOSIS — E1142 Type 2 diabetes mellitus with diabetic polyneuropathy: Secondary | ICD-10-CM | POA: Diagnosis not present

## 2021-09-07 DIAGNOSIS — I471 Supraventricular tachycardia: Secondary | ICD-10-CM | POA: Diagnosis not present

## 2021-09-07 DIAGNOSIS — E785 Hyperlipidemia, unspecified: Secondary | ICD-10-CM | POA: Insufficient documentation

## 2021-09-07 DIAGNOSIS — Z7902 Long term (current) use of antithrombotics/antiplatelets: Secondary | ICD-10-CM | POA: Insufficient documentation

## 2021-09-07 DIAGNOSIS — I5023 Acute on chronic systolic (congestive) heart failure: Secondary | ICD-10-CM | POA: Insufficient documentation

## 2021-09-07 DIAGNOSIS — I255 Ischemic cardiomyopathy: Secondary | ICD-10-CM | POA: Diagnosis not present

## 2021-09-07 DIAGNOSIS — E119 Type 2 diabetes mellitus without complications: Secondary | ICD-10-CM | POA: Diagnosis not present

## 2021-09-07 DIAGNOSIS — Z7982 Long term (current) use of aspirin: Secondary | ICD-10-CM | POA: Diagnosis not present

## 2021-09-07 DIAGNOSIS — Z9861 Coronary angioplasty status: Secondary | ICD-10-CM | POA: Diagnosis not present

## 2021-09-07 DIAGNOSIS — I110 Hypertensive heart disease with heart failure: Secondary | ICD-10-CM | POA: Insufficient documentation

## 2021-09-07 DIAGNOSIS — R630 Anorexia: Secondary | ICD-10-CM | POA: Diagnosis not present

## 2021-09-07 DIAGNOSIS — F0390 Unspecified dementia without behavioral disturbance: Secondary | ICD-10-CM | POA: Insufficient documentation

## 2021-09-07 DIAGNOSIS — I5022 Chronic systolic (congestive) heart failure: Secondary | ICD-10-CM

## 2021-09-07 DIAGNOSIS — I2510 Atherosclerotic heart disease of native coronary artery without angina pectoris: Secondary | ICD-10-CM | POA: Insufficient documentation

## 2021-09-07 DIAGNOSIS — Z955 Presence of coronary angioplasty implant and graft: Secondary | ICD-10-CM | POA: Diagnosis not present

## 2021-09-07 DIAGNOSIS — Z79899 Other long term (current) drug therapy: Secondary | ICD-10-CM | POA: Insufficient documentation

## 2021-09-07 LAB — BASIC METABOLIC PANEL
Anion gap: 9 (ref 5–15)
BUN: 11 mg/dL (ref 8–23)
CO2: 29 mmol/L (ref 22–32)
Calcium: 9.7 mg/dL (ref 8.9–10.3)
Chloride: 100 mmol/L (ref 98–111)
Creatinine, Ser: 0.86 mg/dL (ref 0.44–1.00)
GFR, Estimated: >60 mL/min (ref >60)
Glucose, Bld: 110 mg/dL — ABNORMAL HIGH (ref 70–99)
Potassium: 3.9 mmol/L (ref 3.5–5.1)
Sodium: 138 mmol/L (ref 135–145)

## 2021-09-07 MED ORDER — CLOPIDOGREL BISULFATE 75 MG PO TABS: 75.0000 mg | ORAL_TABLET | Freq: Every day | ORAL | 11 refills | Status: AC

## 2021-09-07 MED ORDER — ATORVASTATIN CALCIUM 80 MG PO TABS: 80.0000 mg | ORAL_TABLET | Freq: Every day | ORAL | 5 refills | Status: DC

## 2021-09-07 NOTE — Progress Notes (Signed)
Advanced Heart Failure Clinic Note   PCP: [facility] Cardiologist: Dr. Shirlee Latch   HPI: Shelby Trevino is a 71 y.o. female with history of AVNRT s/p ablation, CAD (mild to moderate disease in LAD on cath 12/2013) HTN, HLD, DM II. Quite functional at baseline but may have some memory issues (daughter said that this is "not bad").  Works as a Water engineer full time.    Admitted 5/23 with acute systolic CHF. Echo: EF 20-25%, RWMA in LAD territory, RV moderately reduced, moderate MR. Underwent R/LHC showing totally occluded p to m RCA which fills by collaterals from Cx and LAD, Cx okay, 95% p to m LAD, 95% D1, RA mean 14, PA 40/18 (28), PCWP mean 23, LVEDP 27, Fick CO/CI 3.84/2.22. CT surgery and AHF consulted. She was diuresed further w/ IV Lasix. GDMT initiated. cMRI showed delayed enhancement imaging suggested minimal viability in the basal to mid inferior and inferoseptal walls. The mid to apical anteroseptal and anterior walls were borderline in terms of expected viability with revascularization, with 50% wall thickness LGE. LVEF 23%, RVEF 24%, mod MR. cMRI reviewed with Dr. Laneta Simmers. With severe RV dysfunction, borderline viability, and some question of early dementia, decided she would be best served by PCI rather than CABG. She underwent successful PCI to LAD. Placed on DAPT w/ ASA + Plavix and high intensity statin. GDMT titrated and discharged to SNF, weight 119 lb.   Post hospital HF follow up 6/23, recently discharged from SNF. ReDs 29%, Lasix reduced and losartan increased.  Today she returns for HF follow up with her daughter. Overall feeling fatigued. Able to get around the house and complete ADLs if she takes her time. Has dyspnea walking further distances on flat ground. Denies palpitations, abnormal bleeding, CP, dizziness, edema, or PND/Orthopnea. Appetite poor. No fever or chills. Weight at home 108-111 pounds. Ran out of Plavix, Farxiga, and atorvastatin. Has  continued taking dig despite being discontinued. Lives in [place] by herself, son and daughter live nearby. Worked as a Lawyer, wants to go back to work.   Labs (6/23): K 3.8, creatinine 0.87, digoxin 1.2 Labs (7/23): K 4.0, creatinine 0.63   ROS: All systems reviewed and negative except as per HPI.   Past Medical History:  Diagnosis Date   AV nodal tachycardia (HCC) 2004   with radiofrequency ablation by Dr. Grant Fontana in 2004   CAD (coronary artery disease)    80% stenosis in moderate size diagonal on cath in 2004. with last adenosine Cardiolite study in 2007 showing no abnormalities   Depression    Diabetes mellitus    Dyslipidemia    GERD (gastroesophageal reflux disease)    Hyperlipidemia    Hypertension    Mitral valve prolapse    Osteoarthritis    Peripheral neuropathy    Renal insufficiency    Current Outpatient Medications  Medication Sig Dispense Refill   ascorbic acid (VITAMIN C) 500 MG tablet Take 1 tablet by mouth daily.     aspirin EC 81 MG tablet Take 1 tablet (81 mg total) by mouth daily. 30 tablet 6   Cholecalciferol (VITAMIN D3) 5000 UNITS CAPS Take 2,000 Units by mouth daily.     clotrimazole-betamethasone (LOTRISONE) cream Apply 1 application. topically 2 (two) times daily.     digoxin (LANOXIN) 0.125 MG tablet Take 1 tablet (0.125 mg total) by mouth daily. 30 tablet 6   estradiol (ESTRACE) 0.1 MG/GM vaginal cream Place vaginally.     furosemide (LASIX) 40 MG tablet  Take 1 tablet (40 mg total) by mouth 2 (two) times a week. Mondays and Fridays 30 tablet 2   losartan (COZAAR) 25 MG tablet Take 1 tablet (25 mg total) by mouth at bedtime. (Patient taking differently: Take 12.5 mg by mouth daily.) 30 tablet 6   metFORMIN (GLUCOPHAGE-XR) 500 MG 24 hr tablet Take 1 tablet (500 mg total) by mouth 2 (two) times daily.     metoprolol succinate (TOPROL-XL) 25 MG 24 hr tablet Take 1/2 tablet (12.5 mg total) by mouth daily. 30 tablet 5   Multiple Vitamins-Minerals (CENTRUM  SILVER PO) Take 1 tablet by mouth daily.       nitroGLYCERIN (NITROSTAT) 0.4 MG SL tablet Place 1 tablet (0.4 mg total) under the tongue every 5 (five) minutes as needed for chest pain. 25 tablet 2   pantoprazole (PROTONIX) 40 MG tablet Take 1 tablet (40 mg total) by mouth 2 (two) times daily. 40 mg 1 tablet twice a day 60 tablet 6   spironolactone (ALDACTONE) 25 MG tablet Take 1 tablet (25 mg total) by mouth daily. 30 tablet 6   vitamin E 200 UNIT capsule Take by mouth.     No current facility-administered medications for this encounter.   Allergies  Allergen Reactions   Latex     Itching and rash   Social History   Socioeconomic History   Marital status: Divorced    Spouse name: Not on file   Number of children: Not on file   Years of education: Not on file   Highest education level: Not on file  Occupational History   Not on file  Tobacco Use   Smoking status: Former    Packs/day: 0.25    Years: 21.00    Total pack years: 5.25    Types: Cigarettes    Start date: 06/22/1966    Quit date: 07/23/1987    Years since quitting: 34.1   Smokeless tobacco: Never  Substance and Sexual Activity   Alcohol use: No    Alcohol/week: 0.0 standard drinks of alcohol   Drug use: No   Sexual activity: Not on file  Other Topics Concern   Not on file  Social History Narrative   Has one son and one daughter.   Has five grandchildren.   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 3 brothers died with cancer.   Social Determinants of Health   Financial Resource Strain: Medium Risk (06/28/2021)   Overall Financial Resource Strain (CARDIA)    Difficulty of Paying Living Expenses: Somewhat hard  Food Insecurity: Not on file  Transportation Needs: Not on file  Physical Activity: Not on file  Stress: Not on file  Social Connections: Not on file  Intimate Partner Violence: Not on file   Family History  Problem Relation Age of Onset   Cancer Father    Pneumonia  Mother    Cancer Brother        3 brothers died from cancer   Wt Readings from Last 3 Encounters:  09/07/21 51 kg (112 lb 6.4 oz)  07/20/21 53.8 kg (118 lb 9.6 oz)  06/28/21 51 kg (112 lb 6.4 oz)   BP 128/70   Pulse 78   Wt 51 kg (112 lb 6.4 oz)   SpO2 99%   BMI 17.60 kg/m   PHYSICAL EXAM: General:  NAD. No resp difficulty, elderly, walked into clinic. HEENT: Normal Neck: Supple. No JVD. Carotids 2+ bilat; no bruits. No lymphadenopathy or thryomegaly  appreciated. Cor: PMI nondisplaced. Regular rate & rhythm. No rubs, gallops, 2/6 MR Lungs: Clear Abdomen: Soft, nontender, nondistended. No hepatosplenomegaly. No bruits or masses. Good bowel sounds. Extremities: No cyanosis, clubbing, rash, edema Neuro: Alert & oriented x 3, cranial nerves grossly intact. Moves all 4 extremities w/o difficulty. Affect pleasant.  ASSESSMENT & PLAN: 1. Chronic systolic CHF: Ischemic cardiomyopathy.  Echo 6/23 EF 20-25% with moderate RV dysfunction and moderate MR, dilated IVC. In Setting of MV CAD s/p LAD PCI (poor candidate for CABG). RHC on 6/2 showed elevated filling pressures with CI 2.22. cMRI LVEF 23%, RVEF 24%, delayed enhancement imaging suggested minimal viability in the basal to mid inferior and inferoseptal walls. The mid to apical anteroseptal and anterior walls were borderline in terms of expected viability with revascularization. NYHA II-early III, limited mainly by fatigue. She is not volume overloaded on exam. - Has a pessary now due to urinary incontinence, will not re-start Farxiga over concern for GU infection. - Continue losartan 25 mg daily. - Continue Lasix 40 mg on Mondays and Fridays. - Stop digoxin (this was previously stopped due to elevated level) over concerns that she is unable to manage her medications correctly. - Continue spironolactone 25 mg daily.  - Continue Toprol XL 12.5 mg daily.  - Repeat echo next visit. 2. CAD: Cath showed severe proximal to mid LAD and proximal  D1 disease as well as occluded RCA with collaterals.  Given severe RV dysfunction, borderline viability based on cMRI, and some question of early dementia, decided she would be best served by PCI, rather than CABG. She had PCI to LAD. Denies CP. - Continue ASA 81 mg daily.  - Restart Plavix 75 mg daily. Discussed importance on med compliance, especially with her DAPT. - Restart atorvastatin 80 mg daily. LDL goal < 70. Check lipids and HFTs at next appt. 3. Type 2 diabetes:  Hgb A1c 7.6.  - Keep off SGLT2i for now. 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  - Denies palpitations, NSR on recent ECG. - Continue ? blocker. 5. Dementia: Seems significant. I do not think she can manage her medications by herself. Patient lives in [place] so not a candidate for paramedicine. - May need HH aide. Discussed with daughter last visit, and she was willing to help with pillboxes. However, she has been out of several meds for a few weeks now.   - Engage [facility]FS[facility] for resources. 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  Follow up in 2 months with Dr. Shirlee Latch + echo.  Anderson Malta [place], FNP 09/07/21

## 2021-09-07 NOTE — Patient Instructions (Addendum)
Labs done today. We will contact you only if your labs are abnormal.  STOP taking Digoxin  STOP taking Farxiga   RESTART Atorvastatin 80mg  (1 tablet) by mouth daily  RESTART Plavix 75mg  (1 tablet) by mouth daily.   No other medication changes were made. Please continue all current medications as prescribed.  Your physician recommends that you schedule a follow-up appointment in: 2 months with an echo prior to your exam.  Your physician has requested that you have an echocardiogram. Echocardiography is a painless test that uses sound waves to create images of your heart. It provides your doctor with information about the size and shape of your heart and how well your heart's chambers and valves are working. This procedure takes approximately one hour. There are no restrictions for this procedure.  If you have any questions or concerns before your next appointment please send a message through [place] or call our office at [phone].    TO LEAVE A MESSAGE FOR THE NURSE SELECT OPTION 2, PLEASE LEAVE A MESSAGE INCLUDING: YOUR NAME DATE OF BIRTH CALL BACK NUMBER REASON FOR CALL**this is important as we prioritize the call backs  YOU WILL RECEIVE A CALL BACK THE SAME DAY AS LONG AS YOU CALL BEFORE 4:00 PM   Do the following things EVERYDAY: Weigh yourself in the morning before breakfast. Write it down and keep it in a log. Take your medicines as prescribed Eat low salt foods--Limit salt (sodium) to 2000 mg per day.  Stay as active as you can everyday Limit all fluids for the day to less than 2 liters   At the [facility], you and your health needs are our priority. As part of our continuing mission to provide you with exceptional heart care, we have created designated Provider Care Teams. These Care Teams include your primary Cardiologist (physician) and Advanced Practice Providers (APPs- Physician Assistants and Nurse Practitioners) who all work together to  provide you with the care you need, when you need it.   You may see any of the following providers on your designated Care Team at your next follow up: Dr [place] Dr [phone], NP Arvilla Meres, Carron Curie Robbie Lis, PharmD   Please be sure to bring in all your medications bottles to every appointment.

## 2021-09-11 DIAGNOSIS — G510 Bell's palsy: Secondary | ICD-10-CM | POA: Diagnosis not present

## 2021-09-11 DIAGNOSIS — I10 Essential (primary) hypertension: Secondary | ICD-10-CM | POA: Diagnosis not present

## 2021-09-11 DIAGNOSIS — I2510 Atherosclerotic heart disease of native coronary artery without angina pectoris: Secondary | ICD-10-CM | POA: Diagnosis not present

## 2021-09-11 DIAGNOSIS — E7849 Other hyperlipidemia: Secondary | ICD-10-CM | POA: Diagnosis not present

## 2021-09-11 DIAGNOSIS — E782 Mixed hyperlipidemia: Secondary | ICD-10-CM | POA: Diagnosis not present

## 2021-09-11 DIAGNOSIS — E1159 Type 2 diabetes mellitus with other circulatory complications: Secondary | ICD-10-CM | POA: Diagnosis not present

## 2021-09-11 DIAGNOSIS — M25511 Pain in right shoulder: Secondary | ICD-10-CM | POA: Diagnosis not present

## 2021-09-11 DIAGNOSIS — I471 Supraventricular tachycardia: Secondary | ICD-10-CM | POA: Diagnosis not present

## 2021-09-11 DIAGNOSIS — N182 Chronic kidney disease, stage 2 (mild): Secondary | ICD-10-CM | POA: Diagnosis not present

## 2021-09-11 DIAGNOSIS — I255 Ischemic cardiomyopathy: Secondary | ICD-10-CM | POA: Diagnosis not present

## 2021-09-11 DIAGNOSIS — Z681 Body mass index (BMI) 19 or less, adult: Secondary | ICD-10-CM | POA: Diagnosis not present

## 2021-09-11 DIAGNOSIS — E119 Type 2 diabetes mellitus without complications: Secondary | ICD-10-CM | POA: Diagnosis not present

## 2021-09-11 DIAGNOSIS — I5022 Chronic systolic (congestive) heart failure: Secondary | ICD-10-CM | POA: Diagnosis not present

## 2021-11-16 ENCOUNTER — Telehealth (HOSPITAL_COMMUNITY): Payer: Self-pay

## 2021-11-16 ENCOUNTER — Ambulatory Visit (HOSPITAL_BASED_OUTPATIENT_CLINIC_OR_DEPARTMENT_OTHER)
Admission: RE | Admit: 2021-11-16 | Discharge: 2021-11-16 | Disposition: A | Discharge status: Home / Self Care | Payer: Medicare Other | Source: Ambulatory Visit | Attending: Cardiology | Admitting: Cardiology

## 2021-11-16 ENCOUNTER — Ambulatory Visit (HOSPITAL_COMMUNITY)
Admission: RE | Admit: 2021-11-16 | Discharge: 2021-11-16 | Disposition: A | Discharge status: Home / Self Care | Payer: Medicare Other | Source: Ambulatory Visit | Attending: Cardiology | Admitting: Cardiology

## 2021-11-16 ENCOUNTER — Telehealth (HOSPITAL_COMMUNITY): Payer: Self-pay | Admitting: Pharmacy Technician

## 2021-11-16 ENCOUNTER — Other Ambulatory Visit (HOSPITAL_COMMUNITY): Payer: Self-pay

## 2021-11-16 VITALS — BP 120/70 | HR 88 | Wt 119.6 lb

## 2021-11-16 DIAGNOSIS — I5022 Chronic systolic (congestive) heart failure: Secondary | ICD-10-CM | POA: Insufficient documentation

## 2021-11-16 DIAGNOSIS — Z955 Presence of coronary angioplasty implant and graft: Secondary | ICD-10-CM | POA: Diagnosis not present

## 2021-11-16 DIAGNOSIS — Z7982 Long term (current) use of aspirin: Secondary | ICD-10-CM | POA: Insufficient documentation

## 2021-11-16 DIAGNOSIS — I255 Ischemic cardiomyopathy: Secondary | ICD-10-CM | POA: Insufficient documentation

## 2021-11-16 DIAGNOSIS — Z87891 Personal history of nicotine dependence: Secondary | ICD-10-CM | POA: Diagnosis not present

## 2021-11-16 DIAGNOSIS — F03A0 Unspecified dementia, mild, without behavioral disturbance, psychotic disturbance, mood disturbance, and anxiety: Secondary | ICD-10-CM | POA: Insufficient documentation

## 2021-11-16 DIAGNOSIS — Z79899 Other long term (current) drug therapy: Secondary | ICD-10-CM | POA: Insufficient documentation

## 2021-11-16 DIAGNOSIS — Z5986 Financial insecurity: Secondary | ICD-10-CM | POA: Insufficient documentation

## 2021-11-16 DIAGNOSIS — E119 Type 2 diabetes mellitus without complications: Secondary | ICD-10-CM | POA: Insufficient documentation

## 2021-11-16 DIAGNOSIS — E785 Hyperlipidemia, unspecified: Secondary | ICD-10-CM | POA: Diagnosis not present

## 2021-11-16 DIAGNOSIS — I493 Ventricular premature depolarization: Secondary | ICD-10-CM | POA: Insufficient documentation

## 2021-11-16 DIAGNOSIS — I2510 Atherosclerotic heart disease of native coronary artery without angina pectoris: Secondary | ICD-10-CM

## 2021-11-16 DIAGNOSIS — Z9861 Coronary angioplasty status: Secondary | ICD-10-CM | POA: Diagnosis not present

## 2021-11-16 DIAGNOSIS — Z7902 Long term (current) use of antithrombotics/antiplatelets: Secondary | ICD-10-CM | POA: Insufficient documentation

## 2021-11-16 DIAGNOSIS — I3139 Other pericardial effusion (noninflammatory): Secondary | ICD-10-CM | POA: Diagnosis not present

## 2021-11-16 DIAGNOSIS — I110 Hypertensive heart disease with heart failure: Secondary | ICD-10-CM | POA: Diagnosis not present

## 2021-11-16 DIAGNOSIS — Z7984 Long term (current) use of oral hypoglycemic drugs: Secondary | ICD-10-CM | POA: Diagnosis not present

## 2021-11-16 LAB — LIPID PANEL
Cholesterol: 197 mg/dL (ref 0–200)
HDL: 63 mg/dL (ref >40)
LDL Cholesterol: 120 mg/dL — ABNORMAL HIGH (ref 0–99)
Total CHOL/HDL Ratio: 3.1 RATIO
Triglycerides: 72 mg/dL (ref <150)
VLDL: 14 mg/dL (ref 0–40)

## 2021-11-16 LAB — ECHOCARDIOGRAM COMPLETE
Area-P 1/2: 5.38 cm2
Calc EF: 36.2 %
S' Lateral: 3.7 cm
Single Plane A2C EF: 37 %
Single Plane A4C EF: 37.3 %

## 2021-11-16 LAB — BRAIN NATRIURETIC PEPTIDE: B Natriuretic Peptide: 418.4 pg/mL — ABNORMAL HIGH (ref 0.0–100.0)

## 2021-11-16 MED ORDER — ENTRESTO 24-26 MG PO TABS: 1.0000 | ORAL_TABLET | Freq: Two times a day (BID) | ORAL | 11 refills | Status: DC

## 2021-11-16 MED ORDER — FUROSEMIDE 40 MG PO TABS: 40.0000 mg | ORAL_TABLET | ORAL | 2 refills | Status: DC | PRN

## 2021-11-16 NOTE — Progress Notes (Signed)
  Echocardiogram 2D Echocardiogram has been performed.  Delcie Roch 11/16/2021, 11:29 AM

## 2021-11-16 NOTE — Telephone Encounter (Signed)
Advanced Heart Failure Patient Advocate Encounter  Patient was seen in clinic today and started on Entresto. Couldn't locate part D information. Started [employer] assistance. Patient is aware she will need POI as well.

## 2021-11-16 NOTE — Telephone Encounter (Signed)
Referral faxed to [facility] for patient

## 2021-11-16 NOTE — Patient Instructions (Addendum)
START Entresto 24/26 Twice daily  CHANGE Lasix to as need for weight of 3lb in 24 hours or 5lb in a week  Labs done today, your results will be available in MyChart, we will contact you for abnormal readings.   You have been referred to cardiac rehab in [place]. They will contact you to arrange your appointment.  Your physician recommends that you schedule a follow-up appointment in: 1 month  If you have any questions or concerns before your next appointment please send [place] a message through [place] or call our office at [phone].    TO LEAVE A MESSAGE FOR THE NURSE SELECT OPTION 2, PLEASE LEAVE A MESSAGE INCLUDING: YOUR NAME DATE OF BIRTH CALL BACK NUMBER REASON FOR CALL**this is important as we prioritize the call backs  YOU WILL RECEIVE A CALL BACK THE SAME DAY AS LONG AS YOU CALL BEFORE 4:00 PM  At the [facility], you and your health needs are our priority. As part of our continuing mission to provide you with exceptional heart care, we have created designated Provider Care Teams. These Care Teams include your primary Cardiologist (physician) and Advanced Practice Providers (APPs- Physician Assistants and Nurse Practitioners) who all work together to provide you with the care you need, when you need it.   You may see any of the following providers on your designated Care Team at your next follow up: Dr Arvilla Meres Dr Marca Ancona Dr. Marcos Eke, NP Robbie Lis, [place] [facility] [place], [place] Brynda Peon, NP Karle Plumber, PharmD   Please be sure to bring in all your medications bottles to every appointment.

## 2021-11-18 NOTE — Progress Notes (Signed)
Advanced Heart Failure Clinic Note   PCP: [facility] Cardiologist: Dr. Shirlee Latch   HPI: Shelby Trevino is a 71 y.o. female with history of AVNRT s/p ablation, CAD (mild to moderate disease in LAD on cath 12/2013), HTN, HLD, DM II. Quite functional at baseline but has some memory issues (daughter said that this is "not bad").  Works as a Water engineer full time.    Admitted 5/23 with acute systolic CHF. Echo showed EF 20-25%, RWMA in LAD territory, RV moderately reduced, moderate MR. Underwent R/LHC showing totally occluded p to m RCA which fills by collaterals from Cx and LAD, Cx okay, 95% p to m LAD, 95% D1, RA mean 14, PA 40/18 (28), PCWP mean 23, LVEDP 27, Fick CO/CI 3.84/2.22. CT surgery and AHF consulted. She was diuresed further w/ IV Lasix. GDMT initiated. cMRI showed delayed enhancement imaging suggesting minimal viability in the basal to mid inferior and inferoseptal walls. The mid to apical anteroseptal and anterior walls were borderline in terms of expected viability with revascularization, with 50% wall thickness LGE. LVEF 23%, RVEF 24%, mod MR. cMRI reviewed with Dr. Laneta Simmers. With severe RV dysfunction, borderline viability, and some question of early dementia, decided she would be best served by PCI rather than CABG. She underwent successful PCI to LAD. Placed on DAPT w/ ASA + Plavix and high intensity statin. GDMT titrated and discharged to SNF, weight 119 lb.   Echo was done today and reviewed, EF 30-35%, diffuse hypokinesis with basal-mid inferior akinesis, RV normal, IVC normal.   Today she returns for HF follow up with her daughter. She asks about going back to work, works as a Water engineer.  She is short of breath after walking 100 yards.  No lightheadedness, no chest pain.  She does get some fatigue with prolonged activity.  She has short-term memory deficits but per her daughter, these are not severe.   ECG (personally reviewed): NSR, inferior Qs, old  ASMI  Labs (6/23): K 3.8, creatinine 0.87, digoxin 1.2 Labs (7/23): K 4.0, creatinine 0.63 Labs (9/23): K 3.9, creatinine 0.86  ROS: All systems reviewed and negative except as per HPI.   PMH: 1. AVNRT: s/p ablation in 2004.  2. CAD: Cath in 2004 with 80% stenosis in diagonal.  - cMRI in 6/23 showed delayed enhancement imaging suggesting minimal viability in the basal to mid inferior and inferoseptal walls. The mid to apical anteroseptal and anterior walls were borderline in terms of expected viability with revascularization, with 50% wall thickness LGE. LVEF 23%, RVEF 24%, mod MR. Decision made to proceed with PCI rather than CABG.  - Cath (6/23):  Totally occluded p to m RCA which fills by collaterals from Cx and LAD, Cx okay, 95% p to m LAD, 95% D1.  Patient had atherectomy with DES to proximal/mid LAD.  3. Type 2 diabetes 4. GERD 5. Hyperlipidemia 6. HTN 7. Mild dementia 8. Chronic systolic CHF: Ischemic cardiomyopathy.  - Echo (6/23): EF 20-25%, RWMA in LAD territory, RV moderately reduced, moderate MR - RHC (6/23): RA mean 14, PA 40/18 (28), PCWP mean 23, LVEDP 27, Fick CO/CI 3.84/2.22 - Echo (11/23): EF 30-35%, diffuse hypokinesis with basal-mid inferior akinesis, RV normal, IVC normal.   Current Outpatient Medications  Medication Sig Dispense Refill   ascorbic acid (VITAMIN C) 500 MG tablet Take 1 tablet by mouth daily.     aspirin EC 81 MG tablet Take 1 tablet (81 mg total) by mouth daily. 30 tablet 6  atorvastatin (LIPITOR) 80 MG tablet Take 1 tablet (80 mg total) by mouth at bedtime. 30 tablet 5   Cholecalciferol (VITAMIN D3) 5000 UNITS CAPS Take 2,000 Units by mouth daily.     clopidogrel (PLAVIX) 75 MG tablet Take 1 tablet (75 mg total) by mouth daily. 30 tablet 11   clotrimazole-betamethasone (LOTRISONE) cream Apply 1 application. topically 2 (two) times daily.     estradiol (ESTRACE) 0.1 MG/GM vaginal cream Place vaginally.     metFORMIN (GLUCOPHAGE-XR) 500 MG 24 hr  tablet Take 1 tablet (500 mg total) by mouth 2 (two) times daily.     metoprolol succinate (TOPROL-XL) 25 MG 24 hr tablet Take 1/2 tablet (12.5 mg total) by mouth daily. 30 tablet 5   Multiple Vitamins-Minerals (CENTRUM SILVER PO) Take 1 tablet by mouth daily.       nitroGLYCERIN (NITROSTAT) 0.4 MG SL tablet Place 1 tablet (0.4 mg total) under the tongue every 5 (five) minutes as needed for chest pain. 25 tablet 2   pantoprazole (PROTONIX) 40 MG tablet Take 1 tablet (40 mg total) by mouth 2 (two) times daily. 40 mg 1 tablet twice a day 60 tablet 6   sacubitril-valsartan (ENTRESTO) 24-26 MG Take 1 tablet by mouth 2 (two) times daily. 60 tablet 11   spironolactone (ALDACTONE) 25 MG tablet Take 1 tablet (25 mg total) by mouth daily. 30 tablet 6   vitamin E 200 UNIT capsule Take by mouth.     furosemide (LASIX) 40 MG tablet Take 1 tablet (40 mg total) by mouth as needed. 30 tablet 2   No current facility-administered medications for this encounter.   Allergies  Allergen Reactions   Latex     Itching and rash   Social History   Socioeconomic History   Marital status: Divorced    Spouse name: Not on file   Number of children: Not on file   Years of education: Not on file   Highest education level: Not on file  Occupational History   Not on file  Tobacco Use   Smoking status: Former    Packs/day: 0.25    Years: 21.00    Total pack years: 5.25    Types: Cigarettes    Start date: 06/22/1966    Quit date: 07/23/1987    Years since quitting: 34.3   Smokeless tobacco: Never  Substance and Sexual Activity   Alcohol use: No    Alcohol/week: 0.0 standard drinks of alcohol   Drug use: No   Sexual activity: Not on file  Other Topics Concern   Not on file  Social History Narrative   Has one son and one daughter.   Has five grandchildren.   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 3 brothers died with cancer.   Social Determinants of Health   Financial  Resource Strain: Medium Risk (06/28/2021)   Overall Financial Resource Strain (CARDIA)    Difficulty of Paying Living Expenses: Somewhat hard  Food Insecurity: Not on file  Transportation Needs: Not on file  Physical Activity: Not on file  Stress: Not on file  Social Connections: Not on file  Intimate Partner Violence: Not on file   Family History  Problem Relation Age of Onset   Cancer Father    Pneumonia Mother    Cancer Brother        3 brothers died from cancer   Wt Readings from Last 3 Encounters:  11/16/21 54.3 kg (119 lb 9.6 oz)  09/07/21 51 kg (112 lb 6.4 oz)  07/20/21 53.8 kg (118 lb 9.6 oz)   BP 120/70   Pulse 88   Wt 54.3 kg (119 lb 9.6 oz)   SpO2 100%   BMI 18.73 kg/m   PHYSICAL EXAM: General: NAD Neck: No JVD, no thyromegaly or thyroid nodule.  Lungs: Clear to auscultation bilaterally with normal respiratory effort. CV: Nondisplaced PMI.  Heart regular S1/S2, no S3/S4, no murmur.  Trace ankle edema.  No carotid bruit.  Normal pedal pulses.  Abdomen: Soft, nontender, no hepatosplenomegaly, no distention.  Skin: Intact without lesions or rashes.  Neurologic: Alert and oriented x 3.  Psych: Anxious affect. Extremities: No clubbing or cyanosis.  HEENT: Normal.   ASSESSMENT & PLAN: 1. Chronic systolic CHF: Ischemic cardiomyopathy.  Echo 6/23 showed EF 20-25% with moderate RV dysfunction and moderate MR, dilated IVC. RHC in 6/23 showed elevated filling pressures with CI 2.22. cMRI in 6/23 with LVEF 23%, RVEF 24%, delayed enhancement imaging suggested minimal viability in the basal to mid inferior and inferoseptal walls; the mid to apical anteroseptal and anterior walls were borderline in terms of expected viability with revascularization. Echo was done today and reviewed, EF 30-35%, diffuse hypokinesis with basal-mid inferior akinesis, RV normal, IVC normal.  Patient reports NYHA class II symptoms, she is not volume overloaded on exam.  - D/c losartan, start Entresto  24/26 bid. BMET/BNP today and in 10 days.  - Change Lasix to prn.  - Continue spironolactone 25 mg daily.  - Continue Toprol XL 12.5 mg daily.  - Would start SGLT2 inhibitor next appt.  - With persistently low EF and ischemic cardiomyopathy, she qualifies for ICD.  Not CRT candidate with narrow QRS.  I will refer her to EP.  2. CAD: Cath in 6/23 showed severe proximal to mid LAD and proximal D1 disease as well as occluded RCA with collaterals.  Given severe RV dysfunction, borderline viability based on cMRI, and some question of early dementia, it was decided that she would be best served by PCI rather than CABG. She had atherectomy + DES to LAD. No chest pain.  - Continue ASA 81 and Plavix 75.   - Continue atorvastatin, check lipids today. - I will refer for cardiac rehab in [place].  3. Type 2 diabetes:  Per PCP.  4. AVNRT: H/o AVNRT ablation.  No palpitations.   5. Dementia: Mild but present.  I asked daughter to make sure to help her with her meds.   Physically, I think she could go back to work.  Issue will be severity of dementia.   Follow up in 1 month with APP  Marca Ancona, MD 11/18/21

## 2021-11-19 ENCOUNTER — Telehealth (HOSPITAL_COMMUNITY): Payer: Self-pay | Admitting: Cardiology

## 2021-11-19 DIAGNOSIS — I5022 Chronic systolic (congestive) heart failure: Secondary | ICD-10-CM

## 2021-11-19 NOTE — Telephone Encounter (Signed)
-----   Message from Laurey Morale, MD sent at 11/18/2021  8:21 PM EST ----- LDL is too high, goal < 55.  Refer to lipid clinic for [place].

## 2021-11-19 NOTE — Telephone Encounter (Signed)
Referral placed.

## 2021-11-21 NOTE — Telephone Encounter (Signed)
Advanced Heart Failure Patient Advocate Encounter  Application sent in via fax. Document scanned to chart.

## 2021-12-05 ENCOUNTER — Other Ambulatory Visit (HOSPITAL_COMMUNITY): Payer: Self-pay

## 2021-12-05 DIAGNOSIS — E7849 Other hyperlipidemia: Secondary | ICD-10-CM | POA: Diagnosis not present

## 2021-12-05 DIAGNOSIS — I10 Essential (primary) hypertension: Secondary | ICD-10-CM | POA: Diagnosis not present

## 2021-12-05 DIAGNOSIS — E1169 Type 2 diabetes mellitus with other specified complication: Secondary | ICD-10-CM | POA: Diagnosis not present

## 2021-12-05 DIAGNOSIS — E782 Mixed hyperlipidemia: Secondary | ICD-10-CM | POA: Diagnosis not present

## 2021-12-05 DIAGNOSIS — E7800 Pure hypercholesterolemia, unspecified: Secondary | ICD-10-CM | POA: Diagnosis not present

## 2021-12-05 DIAGNOSIS — K219 Gastro-esophageal reflux disease without esophagitis: Secondary | ICD-10-CM | POA: Diagnosis not present

## 2021-12-05 DIAGNOSIS — E7801 Familial hypercholesterolemia: Secondary | ICD-10-CM | POA: Diagnosis not present

## 2021-12-05 NOTE — Telephone Encounter (Addendum)
Advanced Heart Failure Patient Advocate Encounter  Called and spoke with the patient's daughter. Reminded her what POI [employer] will take. Provided her the phone number to [facility] in [place] ([phone]) to help establish part D insurance.

## 2021-12-12 DIAGNOSIS — Z1331 Encounter for screening for depression: Secondary | ICD-10-CM | POA: Diagnosis not present

## 2021-12-12 DIAGNOSIS — I10 Essential (primary) hypertension: Secondary | ICD-10-CM | POA: Diagnosis not present

## 2021-12-12 DIAGNOSIS — I255 Ischemic cardiomyopathy: Secondary | ICD-10-CM | POA: Diagnosis not present

## 2021-12-12 DIAGNOSIS — E1159 Type 2 diabetes mellitus with other circulatory complications: Secondary | ICD-10-CM | POA: Diagnosis not present

## 2021-12-12 DIAGNOSIS — E7849 Other hyperlipidemia: Secondary | ICD-10-CM | POA: Diagnosis not present

## 2021-12-12 DIAGNOSIS — G510 Bell's palsy: Secondary | ICD-10-CM | POA: Diagnosis not present

## 2021-12-12 DIAGNOSIS — M25511 Pain in right shoulder: Secondary | ICD-10-CM | POA: Diagnosis not present

## 2021-12-12 DIAGNOSIS — I2510 Atherosclerotic heart disease of native coronary artery without angina pectoris: Secondary | ICD-10-CM | POA: Diagnosis not present

## 2021-12-12 DIAGNOSIS — I5022 Chronic systolic (congestive) heart failure: Secondary | ICD-10-CM | POA: Diagnosis not present

## 2021-12-12 DIAGNOSIS — N182 Chronic kidney disease, stage 2 (mild): Secondary | ICD-10-CM | POA: Diagnosis not present

## 2021-12-12 DIAGNOSIS — Z1389 Encounter for screening for other disorder: Secondary | ICD-10-CM | POA: Diagnosis not present

## 2021-12-18 ENCOUNTER — Encounter (HOSPITAL_COMMUNITY): Payer: Medicare Other

## 2021-12-18 DIAGNOSIS — M19011 Primary osteoarthritis, right shoulder: Secondary | ICD-10-CM | POA: Diagnosis not present

## 2021-12-18 DIAGNOSIS — M19012 Primary osteoarthritis, left shoulder: Secondary | ICD-10-CM | POA: Diagnosis not present

## 2021-12-18 NOTE — Telephone Encounter (Signed)
Advanced Heart Failure Patient Advocate Encounter  Attempted to contact patient regarding POI for [employer]. No answer, left voicemail.

## 2021-12-20 ENCOUNTER — Encounter (HOSPITAL_COMMUNITY): Payer: Self-pay | Admitting: Licensed Clinical Social Worker

## 2021-12-20 ENCOUNTER — Encounter (HOSPITAL_COMMUNITY): Payer: Medicare Other

## 2021-12-20 NOTE — Progress Notes (Incomplete)
Advanced Heart Failure Clinic Note   PCP: [facility] Cardiologist: Dr. Shirlee Latch   HPI: Shelby Trevino is a 71 y.o. female with history of AVNRT s/p ablation, CAD (mild to moderate disease in LAD on cath 12/2013), HTN, HLD, DM II. Quite functional at baseline but has some memory issues.  Works as a Water engineer full time.    Admitted 5/23 with acute systolic CHF. Echo showed EF 20-25%, RWMA in LAD territory, RV moderately reduced, moderate MR. R/LHC: totally occluded p to m RCA which fills by collaterals from Cx and LAD, Cx okay, 95% p to m LAD, 95% D1, RA mean 14, PA 40/18 (28), PCWP mean 23, LVEDP 27, Fick CO/CI 3.84/2.22. CT surgery and AHF consulted. She was diuresed and GDMT initiated. cMRI showed delayed enhancement imaging suggesting minimal viability in the basal to mid inferior and inferoseptal walls. The mid to apical anteroseptal and anterior walls were borderline in terms of expected viability with revascularization, with 50% wall thickness LGE. LVEF 23%, RVEF 24%, mod MR. cMRI reviewed with Dr. Laneta Simmers. With severe RV dysfunction, borderline viability, and some question of early dementia, decided she would be best served by PCI rather than CABG. She underwent successful PCI to LAD.   Echo 11/23, EF 30-35%, diffuse hypokinesis with basal-mid inferior akinesis, RV normal, IVC normal.   Here today for 1 month f/u.   She has short-term memory deficits but per her daughter, these are not severe.   ECG (personally reviewed): ***  Labs (6/23): K 3.8, creatinine 0.87, digoxin 1.2 Labs (7/23): K 4.0, creatinine 0.63 Labs (9/23): K 3.9, creatinine 0.86  ROS: All systems reviewed and negative except as per HPI.   PMH: 1. AVNRT: s/p ablation in 2004.  2. CAD: Cath in 2004 with 80% stenosis in diagonal.  - cMRI in 6/23 showed delayed enhancement imaging suggesting minimal viability in the basal to mid inferior and inferoseptal walls. The mid to apical anteroseptal and  anterior walls were borderline in terms of expected viability with revascularization, with 50% wall thickness LGE. LVEF 23%, RVEF 24%, mod MR. Decision made to proceed with PCI rather than CABG.  - Cath (6/23):  Totally occluded p to m RCA which fills by collaterals from Cx and LAD, Cx okay, 95% p to m LAD, 95% D1.  Patient had atherectomy with DES to proximal/mid LAD.  3. Type 2 diabetes 4. GERD 5. Hyperlipidemia 6. HTN 7. Mild dementia 8. Chronic systolic CHF: Ischemic cardiomyopathy.  - Echo (6/23): EF 20-25%, RWMA in LAD territory, RV moderately reduced, moderate MR - RHC (6/23): RA mean 14, PA 40/18 (28), PCWP mean 23, LVEDP 27, Fick CO/CI 3.84/2.22 - Echo (11/23): EF 30-35%, diffuse hypokinesis with basal-mid inferior akinesis, RV normal, IVC normal.   Current Outpatient Medications  Medication Sig Dispense Refill   ascorbic acid (VITAMIN C) 500 MG tablet Take 1 tablet by mouth daily.     aspirin EC 81 MG tablet Take 1 tablet (81 mg total) by mouth daily. 30 tablet 6   atorvastatin (LIPITOR) 80 MG tablet Take 1 tablet (80 mg total) by mouth at bedtime. 30 tablet 5   Cholecalciferol (VITAMIN D3) 5000 UNITS CAPS Take 2,000 Units by mouth daily.     clopidogrel (PLAVIX) 75 MG tablet Take 1 tablet (75 mg total) by mouth daily. 30 tablet 11   clotrimazole-betamethasone (LOTRISONE) cream Apply 1 application. topically 2 (two) times daily.     estradiol (ESTRACE) 0.1 MG/GM vaginal cream Place vaginally.  furosemide (LASIX) 40 MG tablet Take 1 tablet (40 mg total) by mouth as needed. 30 tablet 2   metFORMIN (GLUCOPHAGE-XR) 500 MG 24 hr tablet Take 1 tablet (500 mg total) by mouth 2 (two) times daily.     metoprolol succinate (TOPROL-XL) 25 MG 24 hr tablet Take 1/2 tablet (12.5 mg total) by mouth daily. 30 tablet 5   Multiple Vitamins-Minerals (CENTRUM SILVER PO) Take 1 tablet by mouth daily.       nitroGLYCERIN (NITROSTAT) 0.4 MG SL tablet Place 1 tablet (0.4 mg total) under the tongue  every 5 (five) minutes as needed for chest pain. 25 tablet 2   pantoprazole (PROTONIX) 40 MG tablet Take 1 tablet (40 mg total) by mouth 2 (two) times daily. 40 mg 1 tablet twice a day 60 tablet 6   sacubitril-valsartan (ENTRESTO) 24-26 MG Take 1 tablet by mouth 2 (two) times daily. 60 tablet 11   spironolactone (ALDACTONE) 25 MG tablet Take 1 tablet (25 mg total) by mouth daily. 30 tablet 6   vitamin E 200 UNIT capsule Take by mouth.     No current facility-administered medications for this visit.   Allergies  Allergen Reactions   Latex     Itching and rash   Social History   Socioeconomic History   Marital status: Divorced    Spouse name: Not on file   Number of children: Not on file   Years of education: Not on file   Highest education level: Not on file  Occupational History   Not on file  Tobacco Use   Smoking status: Former    Packs/day: 0.25    Years: 21.00    Total pack years: 5.25    Types: Cigarettes    Start date: 06/22/1966    Quit date: 07/23/1987    Years since quitting: 34.4   Smokeless tobacco: Never  Substance and Sexual Activity   Alcohol use: No    Alcohol/week: 0.0 standard drinks of alcohol   Drug use: No   Sexual activity: Not on file  Other Topics Concern   Not on file  Social History Narrative   Has one son and one daughter.   Has five grandchildren.   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 3 brothers died with cancer.   Social Determinants of Health   Financial Resource Strain: Medium Risk (06/28/2021)   Overall Financial Resource Strain (CARDIA)    Difficulty of Paying Living Expenses: Somewhat hard  Food Insecurity: Not on file  Transportation Needs: Not on file  Physical Activity: Not on file  Stress: Not on file  Social Connections: Not on file  Intimate Partner Violence: Not on file   Family History  Problem Relation Age of Onset   Cancer Father    Pneumonia Mother    Cancer Brother        3 brothers  died from cancer   Wt Readings from Last 3 Encounters:  11/16/21 54.3 kg (119 lb 9.6 oz)  09/07/21 51 kg (112 lb 6.4 oz)  07/20/21 53.8 kg (118 lb 9.6 oz)   There were no vitals taken for this visit.  PHYSICAL EXAM: General: NAD Neck: No JVD, no thyromegaly or thyroid nodule.  Lungs: Clear to auscultation bilaterally with normal respiratory effort. CV: Nondisplaced PMI.  Heart regular S1/S2, no S3/S4, no murmur.  Trace ankle edema.  No carotid bruit.  Normal pedal pulses.  Abdomen: Soft, nontender, no hepatosplenomegaly, no distention.  Skin: Intact  without lesions or rashes.  Neurologic: Alert and oriented x 3.  Psych: Anxious affect. Extremities: No clubbing or cyanosis.  HEENT: Normal.   ASSESSMENT & PLAN: 1. Chronic systolic CHF: Ischemic cardiomyopathy.  Echo 6/23 showed EF 20-25% with moderate RV dysfunction and moderate MR, dilated IVC. RHC in 6/23 showed elevated filling pressures with CI 2.22. cMRI in 6/23 with LVEF 23%, RVEF 24%, delayed enhancement imaging suggested minimal viability in the basal to mid inferior and inferoseptal walls; the mid to apical anteroseptal and anterior walls were borderline in terms of expected viability with revascularization. Echo 11/23, EF 30-35%, diffuse hypokinesis with basal-mid inferior akinesis, RV normal, IVC normal.  Patient reports NYHA class II symptoms, she is not volume overloaded on exam. Continue lasix PRN - Continue entresto 24/26 mg BID - Continue spironolactone 25 mg daily.  - Continue Toprol XL 12.5 mg daily.  - Would start SGLT2 inhibitor next appt.  - With persistently low EF and ischemic cardiomyopathy, she qualifies for ICD.  Not CRT candidate with narrow QRS. Refer to EP. 2. CAD: Cath in 6/23 showed severe proximal to mid LAD and proximal D1 disease as well as occluded RCA with collaterals.  Given severe RV dysfunction, borderline viability based on cMRI, and some question of early dementia, it was decided that she would be  best served by PCI rather than CABG. She had atherectomy + DES to LAD. No chest pain.  - Continue ASA 81 and Plavix 75.   - Last LDL 120 on 80 mg Atorvastatin. Referred to lipid clinic but has not been scheduled. - I will refer for cardiac rehab in [place].  3. Type 2 diabetes:  Per PCP.  4. AVNRT: H/o AVNRT ablation.  No palpitations.   5. Dementia: Mild but present.  I asked daughter to make sure to help her with her meds.   Physically, I think she could go back to work.  Issue will be severity of dementia.   Follow up***  Kariann Wecker N, PA-C 12/20/21

## 2022-01-17 ENCOUNTER — Encounter (HOSPITAL_COMMUNITY): Payer: Medicare Other

## 2022-01-17 NOTE — Progress Notes (Incomplete)
Advanced Heart Failure Clinic Note   PCP: [facility] Cardiologist: Dr. Shirlee Latch   HPI: Shelby Trevino is a 72 y.o. female with history of AVNRT s/p ablation, CAD (mild to moderate disease in LAD on cath 12/2013), HTN, HLD, DM II. Quite functional at baseline but has some memory issues (daughter said that this is "not bad").  Works as a Water engineer full time.    Admitted 5/23 with acute systolic CHF. Echo showed EF 20-25%, RWMA in LAD territory, RV moderately reduced, moderate MR. Underwent R/LHC showing totally occluded p to m RCA which fills by collaterals from Cx and LAD, Cx okay, 95% p to m LAD, 95% D1, RA mean 14, PA 40/18 (28), PCWP mean 23, LVEDP 27, Fick CO/CI 3.84/2.22. CT surgery and AHF consulted. She was diuresed further w/ IV Lasix. GDMT initiated. cMRI showed delayed enhancement imaging suggesting minimal viability in the basal to mid inferior and inferoseptal walls. The mid to apical anteroseptal and anterior walls were borderline in terms of expected viability with revascularization, with 50% wall thickness LGE. LVEF 23%, RVEF 24%, mod MR. cMRI reviewed with Dr. Laneta Simmers. With severe RV dysfunction, borderline viability, and some question of early dementia, decided she would be best served by PCI rather than CABG. She underwent successful PCI to LAD. Placed on DAPT w/ ASA + Plavix and high intensity statin. GDMT titrated and discharged to SNF, weight 119 lb.   Echo 11/2021 , EF 30-35%, diffuse hypokinesis with basal-mid inferior akinesis, RV normal, IVC normal.   Today she returns for HF follow up. Last visit losartan was stopped and entresto was started. Overall feeling fine. Denies SOB/PND/Orthopnea. Appetite ok. No fever or chills. Weight at home  pounds. Taking all medications.  Labs (6/23): K 3.8, creatinine 0.87, digoxin 1.2 Labs (7/23): K 4.0, creatinine 0.63 Labs (9/23): K 3.9, creatinine 0.86  ROS: All systems reviewed and negative except as per HPI.    PMH: 1. AVNRT: s/p ablation in 2004.  2. CAD: Cath in 2004 with 80% stenosis in diagonal.  - cMRI in 6/23 showed delayed enhancement imaging suggesting minimal viability in the basal to mid inferior and inferoseptal walls. The mid to apical anteroseptal and anterior walls were borderline in terms of expected viability with revascularization, with 50% wall thickness LGE. LVEF 23%, RVEF 24%, mod MR. Decision made to proceed with PCI rather than CABG.  - Cath (6/23):  Totally occluded p to m RCA which fills by collaterals from Cx and LAD, Cx okay, 95% p to m LAD, 95% D1.  Patient had atherectomy with DES to proximal/mid LAD.  3. Type 2 diabetes 4. GERD 5. Hyperlipidemia 6. HTN 7. Mild dementia 8. Chronic systolic CHF: Ischemic cardiomyopathy.  - Echo (6/23): EF 20-25%, RWMA in LAD territory, RV moderately reduced, moderate MR - RHC (6/23): RA mean 14, PA 40/18 (28), PCWP mean 23, LVEDP 27, Fick CO/CI 3.84/2.22 - Echo (11/23): EF 30-35%, diffuse hypokinesis with basal-mid inferior akinesis, RV normal, IVC normal.   Current Outpatient Medications  Medication Sig Dispense Refill   ascorbic acid (VITAMIN C) 500 MG tablet Take 1 tablet by mouth daily.     aspirin EC 81 MG tablet Take 1 tablet (81 mg total) by mouth daily. 30 tablet 6   atorvastatin (LIPITOR) 80 MG tablet Take 1 tablet (80 mg total) by mouth at bedtime. 30 tablet 5   Cholecalciferol (VITAMIN D3) 5000 UNITS CAPS Take 2,000 Units by mouth daily.     clopidogrel (PLAVIX)  75 MG tablet Take 1 tablet (75 mg total) by mouth daily. 30 tablet 11   clotrimazole-betamethasone (LOTRISONE) cream Apply 1 application. topically 2 (two) times daily.     estradiol (ESTRACE) 0.1 MG/GM vaginal cream Place vaginally.     furosemide (LASIX) 40 MG tablet Take 1 tablet (40 mg total) by mouth as needed. 30 tablet 2   metFORMIN (GLUCOPHAGE-XR) 500 MG 24 hr tablet Take 1 tablet (500 mg total) by mouth 2 (two) times daily.     metoprolol succinate  (TOPROL-XL) 25 MG 24 hr tablet Take 1/2 tablet (12.5 mg total) by mouth daily. 30 tablet 5   Multiple Vitamins-Minerals (CENTRUM SILVER PO) Take 1 tablet by mouth daily.       nitroGLYCERIN (NITROSTAT) 0.4 MG SL tablet Place 1 tablet (0.4 mg total) under the tongue every 5 (five) minutes as needed for chest pain. 25 tablet 2   pantoprazole (PROTONIX) 40 MG tablet Take 1 tablet (40 mg total) by mouth 2 (two) times daily. 40 mg 1 tablet twice a day 60 tablet 6   sacubitril-valsartan (ENTRESTO) 24-26 MG Take 1 tablet by mouth 2 (two) times daily. 60 tablet 11   spironolactone (ALDACTONE) 25 MG tablet Take 1 tablet (25 mg total) by mouth daily. 30 tablet 6   vitamin E 200 UNIT capsule Take by mouth.     No current facility-administered medications for this visit.   Allergies  Allergen Reactions   Latex     Itching and rash   Social History   Socioeconomic History   Marital status: Divorced    Spouse name: Not on file   Number of children: Not on file   Years of education: Not on file   Highest education level: Not on file  Occupational History   Not on file  Tobacco Use   Smoking status: Former    Packs/day: 0.25    Years: 21.00    Total pack years: 5.25    Types: Cigarettes    Start date: 06/22/1966    Quit date: 07/23/1987    Years since quitting: 34.5   Smokeless tobacco: Never  Substance and Sexual Activity   Alcohol use: No    Alcohol/week: 0.0 standard drinks of alcohol   Drug use: No   Sexual activity: Not on file  Other Topics Concern   Not on file  Social History Narrative   Has one son and one daughter.   Has five grandchildren.   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 3 brothers died with cancer.   Social Determinants of Health   Financial Resource Strain: Medium Risk (06/28/2021)   Overall Financial Resource Strain (CARDIA)    Difficulty of Paying Living Expenses: Somewhat hard  Food Insecurity: Not on file  Transportation Needs:  Not on file  Physical Activity: Not on file  Stress: Not on file  Social Connections: Not on file  Intimate Partner Violence: Not on file   Family History  Problem Relation Age of Onset   Cancer Father    Pneumonia Mother    Cancer Brother        3 brothers died from cancer   Wt Readings from Last 3 Encounters:  11/16/21 54.3 kg (119 lb 9.6 oz)  09/07/21 51 kg (112 lb 6.4 oz)  07/20/21 53.8 kg (118 lb 9.6 oz)   There were no vitals taken for this visit.  PHYSICAL EXAM: General:  Well appearing. No resp difficulty HEENT: normal Neck:  supple. no JVD. Carotids 2+ bilat; no bruits. No lymphadenopathy or thryomegaly appreciated. Cor: PMI nondisplaced. Regular rate & rhythm. No rubs, gallops or murmurs. Lungs: clear Abdomen: soft, nontender, nondistended. No hepatosplenomegaly. No bruits or masses. Good bowel sounds. Extremities: no cyanosis, clubbing, rash, edema Neuro: alert & orientedx3, cranial nerves grossly intact. moves all 4 extremities w/o difficulty. Affect pleasant  ASSESSMENT & PLAN: 1. Chronic systolic CHF: Ischemic cardiomyopathy.  Echo 6/23 showed EF 20-25% with moderate RV dysfunction and moderate MR, dilated IVC. RHC in 6/23 showed elevated filling pressures with CI 2.22. cMRI in 6/23 with LVEF 23%, RVEF 24%, delayed enhancement imaging suggested minimal viability in the basal to mid inferior and inferoseptal walls; the mid to apical anteroseptal and anterior walls were borderline in terms of expected viability with revascularization. Echo 11/2021 EF 30-35%, diffuse hypokinesis with basal-mid inferior akinesis, RV normal, IVC normal.  Patient reports NYHA class II symptoms, she is not volume overloaded on exam.  - Continue  Entresto 24/26 bid.  - Change Lasix to prn.  - Continue spironolactone 25 mg daily.  - Continue Toprol XL 12.5 mg daily.  - Would start SGLT2 inhibitor next appt.  - With persistently low EF and ischemic cardiomyopathy, she qualifies for ICD.  Not  CRT candidate with narrow QRS.    2. CAD: Cath in 6/23 showed severe proximal to mid LAD and proximal D1 disease as well as occluded RCA with collaterals.  Given severe RV dysfunction, borderline viability based on cMRI, and some question of early dementia, it was decided that she would be best served by PCI rather than CABG. She had atherectomy + DES to LAD. No chest pain.  - Continue ASA 81 and Plavix 75.   - Continue atorvastatin 3. Type 2 diabetes:  Per PCP.  4. AVNRT: H/o AVNRT ablation.  No palpitations.   5. Dementia: Mild but present.    Follow up in 1 month with APP  Darrick Grinder, NP 01/17/22

## 2022-02-07 ENCOUNTER — Ambulatory Visit (HOSPITAL_COMMUNITY)
Admission: RE | Admit: 2022-02-07 | Discharge: 2022-02-07 | Disposition: A | Discharge status: Home / Self Care | Payer: Medicare Other | Source: Ambulatory Visit | Attending: Adult Health | Admitting: Adult Health

## 2022-02-07 ENCOUNTER — Other Ambulatory Visit (HOSPITAL_COMMUNITY): Payer: Self-pay

## 2022-02-07 VITALS — BP 110/68 | HR 80 | Wt 113.0 lb

## 2022-02-07 DIAGNOSIS — Z5986 Financial insecurity: Secondary | ICD-10-CM | POA: Insufficient documentation

## 2022-02-07 DIAGNOSIS — I5022 Chronic systolic (congestive) heart failure: Secondary | ICD-10-CM | POA: Diagnosis not present

## 2022-02-07 DIAGNOSIS — E119 Type 2 diabetes mellitus without complications: Secondary | ICD-10-CM | POA: Insufficient documentation

## 2022-02-07 DIAGNOSIS — Z7982 Long term (current) use of aspirin: Secondary | ICD-10-CM | POA: Diagnosis not present

## 2022-02-07 DIAGNOSIS — I2510 Atherosclerotic heart disease of native coronary artery without angina pectoris: Secondary | ICD-10-CM | POA: Diagnosis not present

## 2022-02-07 DIAGNOSIS — I110 Hypertensive heart disease with heart failure: Secondary | ICD-10-CM | POA: Diagnosis not present

## 2022-02-07 DIAGNOSIS — Z87891 Personal history of nicotine dependence: Secondary | ICD-10-CM | POA: Insufficient documentation

## 2022-02-07 DIAGNOSIS — F03A0 Unspecified dementia, mild, without behavioral disturbance, psychotic disturbance, mood disturbance, and anxiety: Secondary | ICD-10-CM | POA: Diagnosis not present

## 2022-02-07 DIAGNOSIS — Z7902 Long term (current) use of antithrombotics/antiplatelets: Secondary | ICD-10-CM | POA: Insufficient documentation

## 2022-02-07 DIAGNOSIS — Z7984 Long term (current) use of oral hypoglycemic drugs: Secondary | ICD-10-CM | POA: Insufficient documentation

## 2022-02-07 DIAGNOSIS — I255 Ischemic cardiomyopathy: Secondary | ICD-10-CM | POA: Diagnosis not present

## 2022-02-07 DIAGNOSIS — E785 Hyperlipidemia, unspecified: Secondary | ICD-10-CM | POA: Diagnosis not present

## 2022-02-07 DIAGNOSIS — Z955 Presence of coronary angioplasty implant and graft: Secondary | ICD-10-CM | POA: Diagnosis not present

## 2022-02-07 DIAGNOSIS — Z79899 Other long term (current) drug therapy: Secondary | ICD-10-CM | POA: Diagnosis not present

## 2022-02-07 LAB — BASIC METABOLIC PANEL
Anion gap: 9 (ref 5–15)
BUN: 13 mg/dL (ref 8–23)
CO2: 25 mmol/L (ref 22–32)
Calcium: 9.2 mg/dL (ref 8.9–10.3)
Chloride: 103 mmol/L (ref 98–111)
Creatinine, Ser: 0.84 mg/dL (ref 0.44–1.00)
GFR, Estimated: >60 mL/min (ref >60)
Glucose, Bld: 101 mg/dL — ABNORMAL HIGH (ref 70–99)
Potassium: 4 mmol/L (ref 3.5–5.1)
Sodium: 137 mmol/L (ref 135–145)

## 2022-02-07 MED ORDER — LOSARTAN POTASSIUM 25 MG PO TABS: 12.5000 mg | ORAL_TABLET | Freq: Every day | ORAL | 11 refills | Status: DC

## 2022-02-07 NOTE — Progress Notes (Addendum)
Advanced Heart Failure Clinic Note   PCP: [facility] Cardiologist: Dr. Aundra Dubin   HPI: Shelby Trevino is a 72 y.o. female with history of AVNRT s/p ablation, CAD (mild to moderate disease in LAD on cath 12/2013), HTN, HLD, DM II. Quite functional at baseline but has some memory issues (daughter said that this is "not bad").  Works as a Programmer, applications full time.    Admitted 5/23 with acute systolic CHF. Echo showed EF 20-25%, RWMA in LAD territory, RV moderately reduced, moderate MR. Underwent R/LHC showing totally occluded p to m RCA which fills by collaterals from Cx and LAD, Cx okay, 95% p to m LAD, 95% D1, RA mean 14, PA 40/18 (28), PCWP mean 23, LVEDP 27, Fick CO/CI 3.84/2.29. CT surgery and AHF consulted. She was diuresed further w/ IV Lasix. GDMT initiated. cMRI showed delayed enhancement imaging suggesting minimal viability in the basal to mid inferior and inferoseptal walls. The mid to apical anteroseptal and anterior walls were borderline in terms of expected viability with revascularization, with 50% wall thickness LGE. LVEF 23%, RVEF 24%, mod MR. cMRI reviewed with Dr. Cyndia Bent. With severe RV dysfunction, borderline viability, and some question of early dementia, decided she would be best served by PCI rather than CABG. She underwent successful PCI to LAD. Placed on DAPT w/ ASA + Plavix and high intensity statin. GDMT titrated and discharged to SNF, weight 119 lb.   Echo 11/2021 , EF 30-35%, diffuse hypokinesis with basal-mid inferior akinesis, RV normal, IVC normal.   Today she returns for HF follow up with her daughter. Last visit losartan was stopped and entresto was started. She later stopped due to cost. She is not interested in starting entresto again. Overall feeling ok. Says she has to take her time when walking. SOB if she tries to walk fast. Denies PND/Orthopnea. Appetite ok. No fever or chills. Weight at home has been stable. Taking all other medications. Lives  alone.   Labs (6/23): K 3.8, creatinine 0.87, digoxin 1.2 Labs (7/23): K 4.0, creatinine 0.63 Labs (9/23): K 3.9, creatinine 0.86  ROS: All systems reviewed and negative except as per HPI.   PMH: 1. AVNRT: s/p ablation in 2004.  2. CAD: Cath in 2004 with 80% stenosis in diagonal.  - cMRI in 6/23 showed delayed enhancement imaging suggesting minimal viability in the basal to mid inferior and inferoseptal walls. The mid to apical anteroseptal and anterior walls were borderline in terms of expected viability with revascularization, with 50% wall thickness LGE. LVEF 23%, RVEF 24%, mod MR. Decision made to proceed with PCI rather than CABG.  - Cath (6/23):  Totally occluded p to m RCA which fills by collaterals from Cx and LAD, Cx okay, 95% p to m LAD, 95% D1.  Patient had atherectomy with DES to proximal/mid LAD.  3. Type 2 diabetes 4. GERD 5. Hyperlipidemia 6. HTN 7. Mild dementia 8. Chronic systolic CHF: Ischemic cardiomyopathy.  - Echo (6/23): EF 20-25%, RWMA in LAD territory, RV moderately reduced, moderate MR - RHC (6/23): RA mean 14, PA 40/18 (28), PCWP mean 23, LVEDP 27, Fick CO/CI 3.84/2.22 - Echo (11/23): EF 30-35%, diffuse hypokinesis with basal-mid inferior akinesis, RV normal, IVC normal.   Current Outpatient Medications  Medication Sig Dispense Refill   ascorbic acid (VITAMIN C) 500 MG tablet Take 1 tablet by mouth daily.     aspirin EC 81 MG tablet Take 1 tablet (81 mg total) by mouth daily. 30 tablet 6  atorvastatin (LIPITOR) 80 MG tablet Take 1 tablet (80 mg total) by mouth at bedtime. 30 tablet 5   Cholecalciferol (VITAMIN D3) 5000 UNITS CAPS Take 2,000 Units by mouth daily.     clopidogrel (PLAVIX) 75 MG tablet Take 1 tablet (75 mg total) by mouth daily. 30 tablet 11   clotrimazole-betamethasone (LOTRISONE) cream Apply 1 application. topically 2 (two) times daily.     estradiol (ESTRACE) 0.1 MG/GM vaginal cream Place vaginally.     furosemide (LASIX) 40 MG tablet Take 1  tablet (40 mg total) by mouth as needed. 30 tablet 2   metFORMIN (GLUCOPHAGE-XR) 500 MG 24 hr tablet Take 1 tablet (500 mg total) by mouth 2 (two) times daily.     metoprolol succinate (TOPROL-XL) 25 MG 24 hr tablet Take 1/2 tablet (12.5 mg total) by mouth daily. 30 tablet 5   Multiple Vitamins-Minerals (CENTRUM SILVER PO) Take 1 tablet by mouth daily.       nitroGLYCERIN (NITROSTAT) 0.4 MG SL tablet Place 1 tablet (0.4 mg total) under the tongue every 5 (five) minutes as needed for chest pain. 25 tablet 2   pantoprazole (PROTONIX) 40 MG tablet Take 1 tablet (40 mg total) by mouth 2 (two) times daily. 40 mg 1 tablet twice a day 60 tablet 6   spironolactone (ALDACTONE) 25 MG tablet Take 1 tablet (25 mg total) by mouth daily. 30 tablet 6   vitamin E 200 UNIT capsule Take by mouth.     No current facility-administered medications for this encounter.   Allergies  Allergen Reactions   Latex     Itching and rash   Social History   Socioeconomic History   Marital status: Divorced    Spouse name: Not on file   Number of children: Not on file   Years of education: Not on file   Highest education level: Not on file  Occupational History   Not on file  Tobacco Use   Smoking status: Former    Packs/day: 0.25    Years: 21.00    Total pack years: 5.25    Types: Cigarettes    Start date: 06/22/1966    Quit date: 07/23/1987    Years since quitting: 34.5   Smokeless tobacco: Never  Substance and Sexual Activity   Alcohol use: No    Alcohol/week: 0.0 standard drinks of alcohol   Drug use: No   Sexual activity: Not on file  Other Topics Concern   Not on file  Social History Narrative   Has one son and one daughter.   Has five grandchildren.   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 3 brothers died with cancer.   Social Determinants of Health   Financial Resource Strain: Medium Risk (06/28/2021)   Overall Financial Resource Strain (CARDIA)    Difficulty of  Paying Living Expenses: Somewhat hard  Food Insecurity: Not on file  Transportation Needs: Not on file  Physical Activity: Not on file  Stress: Not on file  Social Connections: Not on file  Intimate Partner Violence: Not on file   Family History  Problem Relation Age of Onset   Cancer Father    Pneumonia Mother    Cancer Brother        3 brothers died from cancer   Wt Readings from Last 3 Encounters:  02/07/22 51.3 kg (113 lb)  11/16/21 54.3 kg (119 lb 9.6 oz)  09/07/21 51 kg (112 lb 6.4 oz)   BP 110/68  Pulse 80   Wt 51.3 kg (113 lb)   SpO2 100%   BMI 17.70 kg/m   PHYSICAL EXAM: General:  Well appearing. No resp difficulty HEENT: normal Neck: supple. no JVD. Carotids 2+ bilat; no bruits. No lymphadenopathy or thryomegaly appreciated. Cor: PMI nondisplaced. Regular rate & rhythm. No rubs, gallops or murmurs. Lungs: clear Abdomen: soft, nontender, nondistended. No hepatosplenomegaly. No bruits or masses. Good bowel sounds. Extremities: no cyanosis, clubbing, rash, edema Neuro: alert & orientedx3, cranial nerves grossly intact. moves all 4 extremities w/o difficulty. Affect pleasant  ASSESSMENT & PLAN: 1. Chronic systolic CHF: Ischemic cardiomyopathy.  Echo 6/23 showed EF 20-25% with moderate RV dysfunction and moderate MR, dilated IVC. RHC in 6/23 showed elevated filling pressures with CI 2.22. cMRI in 6/23 with LVEF 23%, RVEF 24%, delayed enhancement imaging suggested minimal viability in the basal to mid inferior and inferoseptal walls; the mid to apical anteroseptal and anterior walls were borderline in terms of expected viability with revascularization. Echo 11/2021 EF 30-35%, diffuse hypokinesis with basal-mid inferior akinesis, RV normal, IVC normal.   .- NYHA III. Volume status stable. Continue lasix as needed.   - Continue spironolactone 25 mg daily.  - Continue Toprol XL 12.5 mg daily.  - Would start SGLT2 inhibitor soon.  - Add 12.5 mg losartan daily. She does not  want to restart entresto. I am also concerned her SBP would drop to much.   - With persistently low EF and ischemic cardiomyopathy, she qualifies for ICD.  Not CRT candidate with narrow QRS.  I referred to EP today. We discussed.  2. CAD: Cath in 6/23 showed severe proximal to mid LAD and proximal D1 disease as well as occluded RCA with collaterals.  Given severe RV dysfunction, borderline viability based on cMRI, and some question of early dementia, it was decided that she would be best served by PCI rather than CABG. She had atherectomy + DES to LAD. No chest pain.  - Continue ASA 81 and Plavix 75.   - Continue atorvastatin 3. Type 2 diabetes:  Per PCP.  4. AVNRT: H/o AVNRT ablation.  No palpitations.   5. Dementia: Mild but present.    Check BMET  Cardiac Rehab---Referred in November to [facility].   Follow up in 6 weeks.   Darrick Grinder, NP 02/07/22

## 2022-02-07 NOTE — Patient Instructions (Signed)
Start Losartan 12.5 mg (1/2 tablet) daily. Rx sent to local pharmacy. Labs today - will call you if abnormal. Referral sent for EP consult to evaluate need for ICD. See appointment below.  Return to Heart Failure APP clinic in 6 weeks.

## 2022-02-11 ENCOUNTER — Ambulatory Visit: Payer: Medicare Other | Attending: Cardiovascular Disease | Admitting: Cardiovascular Disease

## 2022-02-11 ENCOUNTER — Encounter: Payer: Self-pay | Admitting: Cardiovascular Disease

## 2022-02-11 VITALS — BP 112/70 | HR 79 | Ht 67.0 in | Wt 112.0 lb

## 2022-02-11 DIAGNOSIS — Z01812 Encounter for preprocedural laboratory examination: Secondary | ICD-10-CM | POA: Diagnosis not present

## 2022-02-11 DIAGNOSIS — I493 Ventricular premature depolarization: Secondary | ICD-10-CM | POA: Diagnosis not present

## 2022-02-11 DIAGNOSIS — I5022 Chronic systolic (congestive) heart failure: Secondary | ICD-10-CM

## 2022-02-11 NOTE — Progress Notes (Signed)
Electrophysiology Office Note:    Date:  02/11/2022   ID:  Shelby Trevino, DOB 01/17/1950, MRN [id number]  PCP:  [facility] Cardiologist:  None     Referring MD: Larey Dresser, MD   History of Present Illness:    Shelby Trevino is a 72 y.o. female with a hx listed below, significant for AVNRT s/p ablation 2004, CAD with ischemic cardiomyopathy, CHFrEF referred to discuss ICD placement.  He was diagnosed with heart failure in May 2023.  Cath showed occlusive coronary disease in multiple territories.  Based upon the patient's risk factors and the question of early dementia, PCI of the LAD was performed rather than bypass surgery.  In follow-up, 6 months later, EF remained low at 30 to 35%. She has not been able to take entresto due to cost. Otherwise, she has been taking GDMT.  She is referred today to discuss ICD placement for primary prevention. She reports that she is functional and enjoys her current quality of life.   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 Donnel Saxon in 2004   CAD (coronary artery disease)    80% stenosis in moderate size diagonal on cath in 2004. with last adenosine Cardiolite study in 2007 showing no abnormalities   Depression    Diabetes mellitus    Dyslipidemia    GERD (gastroesophageal reflux disease)    Hyperlipidemia    Hypertension    Mitral valve prolapse    Osteoarthritis    Peripheral neuropathy    Renal insufficiency     Past Surgical History:  Procedure Laterality Date   CARDIAC CATHETERIZATION  01/21/2002   80% stenosis of moderate sized diagonal branch   CHOLECYSTECTOMY  1978   CORONARY ATHERECTOMY N/A 06/13/2021   Procedure: CORONARY ATHERECTOMY;  Surgeon: Leonie Man, MD;  Location: [facility];  Service: Cardiovascular;  Laterality: N/A;   CORONARY STENT INTERVENTION N/A 06/13/2021   Procedure: CORONARY  STENT INTERVENTION;  Surgeon: Leonie Man, MD;  Location: [facility];  Service: Cardiovascular;  Laterality: N/A;   INTRAVASCULAR IMAGING/OCT N/A 06/13/2021   Procedure: INTRAVASCULAR IMAGING/OCT;  Surgeon: Leonie Man, MD;  Location: [facility];  Service: Cardiovascular;  Laterality: N/A;   LEFT BREAST BIOPSY     By Dr. Anthony Sar showed fibrocystic changes   LEFT HEART CATH AND CORONARY ANGIOGRAPHY N/A 06/13/2021   Procedure: LEFT HEART CATH AND CORONARY ANGIOGRAPHY;  Surgeon: Leonie Man, MD;  Location: [facility];  Service: Cardiovascular;  Laterality: N/A;   LEFT HEART CATHETERIZATION WITH CORONARY ANGIOGRAM N/A 12/28/2013   Procedure: LEFT HEART CATHETERIZATION WITH CORONARY ANGIOGRAM;  Surgeon: Jettie Booze, MD;  Location: [facility];  Service: Cardiovascular;  Laterality: N/A;   RIGHT/LEFT HEART CATH AND CORONARY ANGIOGRAPHY N/A 06/08/2021   Procedure: RIGHT/LEFT HEART CATH AND CORONARY ANGIOGRAPHY;  Surgeon: Belva Crome, MD;  Location: [facility];  Service: Cardiovascular;  Laterality: N/A;    Current Medications: No outpatient medications have been marked as taking for the 02/11/22 encounter (Appointment) with Farrah Skoda, Yetta Barre, MD.     Allergies:   Latex   Social History   Socioeconomic History   Marital status: Divorced    Spouse name: Not on file   Number of children: Not on file   Years of education: Not on file   Highest education level:  Not on file  Occupational History   Not on file  Tobacco Use   Smoking status: Former    Packs/day: 0.25    Years: 21.00    Total pack years: 5.25    Types: Cigarettes    Start date: 06/22/1966    Quit date: 07/23/1987    Years since quitting: 34.5   Smokeless tobacco: Never  Substance and Sexual Activity   Alcohol use: No    Alcohol/week: 0.0 standard drinks of alcohol   Drug use: No   Sexual activity: Not on file  Other Topics Concern   Not on file  Social History Narrative    Has one son and one daughter.   Has five grandchildren.   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 3 brothers died with cancer.   Social Determinants of Health   Financial Resource Strain: Medium Risk (06/28/2021)   Overall Financial Resource Strain (CARDIA)    Difficulty of Paying Living Expenses: Somewhat hard  Food Insecurity: Not on file  Transportation Needs: Not on file  Physical Activity: Not on file  Stress: Not on file  Social Connections: Not on file     Family History: The patient's family history includes Cancer in her brother and father; Pneumonia in her mother.  ROS:   Please see the history of present illness.    All other systems reviewed and are negative.  EKGs/Labs/Other Studies Reviewed Today:    TTEs: 12/02/2021 and 06/2021 CMR 06/2021  EKG:  Last EKG results: today - sinus rhythm   Recent Labs: 06/07/2021: TSH 1.572 06/09/2021: ALT 83 06/13/2021: Magnesium 2.3 06/14/2021: Hemoglobin 13.1; Platelets 414 11/16/2021: B Natriuretic Peptide 418.4 02/07/2022: BUN 13; Creatinine, Ser 0.84; Potassium 4.0; Sodium 137     Physical Exam:    VS:  There were no vitals taken for this visit.    Wt Readings from Last 3 Encounters:  02/07/22 113 lb (51.3 kg)  11/16/21 119 lb 9.6 oz (54.3 kg)  09/07/21 112 lb 6.4 oz (51 kg)     GEN: Thin, elderly-appearing, in no acute distress CARDIAC: RRR, no murmurs, rubs, gallops RESPIRATORY:  Normal work of breathing MUSCULOSKELETAL: no edema    ASSESSMENT & PLAN:    Ischemic cardiomyopathy: I discussed the indication for the ICD placement procedure and the logistics, risks, potential benefit, and after care. I specifically explained that risks include but are not limited to infection, bleeding,damage to blood vessels, lung, and the heart -- but risk of prolonged hospitalization, need for surgery, or the event of stroke, heart attack, or death are low but not zero.   CHFrEF: EF remains < 35% despite  GDMT.        Medication Adjustments/Labs and Tests Ordered: Current medicines are reviewed at length with the patient today.  Concerns regarding medicines are outlined above.  No orders of the defined types were placed in this encounter.  No orders of the defined types were placed in this encounter.    Signed, Melida Quitter, MD  02/11/2022 8:54 AM    [place]

## 2022-02-11 NOTE — Patient Instructions (Addendum)
Medication Instructions:  Your physician recommends that you continue on your current medications as directed. Please refer to the Current Medication list given to you today.   *If you need a refill on your cardiac medications before your next appointment, please call your pharmacy*   Lab Work: On Thursday March 21, 2022: CBC, BMET (you do not need to be fasting)   **Ask for April G to review instructions and pick-up CHG scrub prior to procedure.**  You may come to the lab any time between 7:30am and 4:30pm. If you have labs (blood work) drawn today and your tests are completely normal, you will receive your results only by: [facility] (if you have MyChart) OR A paper copy in the mail If you have any lab test that is abnormal or we need to change your treatment, we will call you to review the results.   Testing/Procedures: Your physician has requested that you have a single-chamber defibrillator implant placed.   Your defibrillator procedure is scheduled for Friday March 29, 2022 with Dr.  Kaleen Odea at 01:30pm. You will arrive at [facility]. [facility] (main entrance A) at 11:30am.   Follow-Up: At [facility], you and your health needs are our priority.  As part of our continuing mission to provide you with exceptional heart care, we have created designated Provider Care Teams.  These Care Teams include your primary Cardiologist (physician) and Advanced Practice Providers (APPs -  Physician Assistants and Nurse Practitioners) who all work together to provide you with the care you need, when you need it.   Your next appointment:   10-14 days after defibrillator implant for wound check 91 days after procedure with Dr. Myles Gip  We will call to schedule these appointments with you.

## 2022-02-11 NOTE — Addendum Note (Signed)
Addended by: Molli Barrows on: 02/11/2022 01:00 PM   Modules accepted: Orders

## 2022-02-22 ENCOUNTER — Other Ambulatory Visit (HOSPITAL_COMMUNITY): Payer: Self-pay | Admitting: Cardiology

## 2022-03-12 DIAGNOSIS — K219 Gastro-esophageal reflux disease without esophagitis: Secondary | ICD-10-CM | POA: Diagnosis not present

## 2022-03-12 DIAGNOSIS — E7849 Other hyperlipidemia: Secondary | ICD-10-CM | POA: Diagnosis not present

## 2022-03-12 DIAGNOSIS — N182 Chronic kidney disease, stage 2 (mild): Secondary | ICD-10-CM | POA: Diagnosis not present

## 2022-03-12 DIAGNOSIS — R5383 Other fatigue: Secondary | ICD-10-CM | POA: Diagnosis not present

## 2022-03-12 DIAGNOSIS — E119 Type 2 diabetes mellitus without complications: Secondary | ICD-10-CM | POA: Diagnosis not present

## 2022-03-12 DIAGNOSIS — E7801 Familial hypercholesterolemia: Secondary | ICD-10-CM | POA: Diagnosis not present

## 2022-03-12 DIAGNOSIS — I10 Essential (primary) hypertension: Secondary | ICD-10-CM | POA: Diagnosis not present

## 2022-03-19 DIAGNOSIS — G510 Bell's palsy: Secondary | ICD-10-CM | POA: Diagnosis not present

## 2022-03-19 DIAGNOSIS — N182 Chronic kidney disease, stage 2 (mild): Secondary | ICD-10-CM | POA: Diagnosis not present

## 2022-03-19 DIAGNOSIS — E7849 Other hyperlipidemia: Secondary | ICD-10-CM | POA: Diagnosis not present

## 2022-03-19 DIAGNOSIS — I255 Ischemic cardiomyopathy: Secondary | ICD-10-CM | POA: Diagnosis not present

## 2022-03-19 DIAGNOSIS — Z6820 Body mass index (BMI) 20.0-20.9, adult: Secondary | ICD-10-CM | POA: Diagnosis not present

## 2022-03-19 DIAGNOSIS — M25511 Pain in right shoulder: Secondary | ICD-10-CM | POA: Diagnosis not present

## 2022-03-19 DIAGNOSIS — I10 Essential (primary) hypertension: Secondary | ICD-10-CM | POA: Diagnosis not present

## 2022-03-19 DIAGNOSIS — I2510 Atherosclerotic heart disease of native coronary artery without angina pectoris: Secondary | ICD-10-CM | POA: Diagnosis not present

## 2022-03-19 DIAGNOSIS — E119 Type 2 diabetes mellitus without complications: Secondary | ICD-10-CM | POA: Diagnosis not present

## 2022-03-19 DIAGNOSIS — I5022 Chronic systolic (congestive) heart failure: Secondary | ICD-10-CM | POA: Diagnosis not present

## 2022-03-19 DIAGNOSIS — E1159 Type 2 diabetes mellitus with other circulatory complications: Secondary | ICD-10-CM | POA: Diagnosis not present

## 2022-03-21 ENCOUNTER — Encounter (HOSPITAL_COMMUNITY): Payer: Medicare Other

## 2022-03-21 ENCOUNTER — Other Ambulatory Visit: Payer: Medicare Other

## 2022-03-25 ENCOUNTER — Telehealth: Payer: Self-pay | Admitting: Cardiovascular Disease

## 2022-03-25 ENCOUNTER — Other Ambulatory Visit: Payer: Self-pay

## 2022-03-25 DIAGNOSIS — I5022 Chronic systolic (congestive) heart failure: Secondary | ICD-10-CM

## 2022-03-25 NOTE — Telephone Encounter (Signed)
I spoke with pt's Daughter and she has the instruction letter but didn't get labs done. She will have to pt go to [facility] tomorrow for labs and get scrub soap from the [facility] there.   Orders have been placed.

## 2022-03-25 NOTE — Telephone Encounter (Signed)
Daughter is following-up for instructions prior to upcoming surgery.

## 2022-03-26 ENCOUNTER — Other Ambulatory Visit (HOSPITAL_COMMUNITY)
Admission: RE | Admit: 2022-03-26 | Discharge: 2022-03-26 | Disposition: A | Discharge status: Home / Self Care | Payer: Medicare Other | Source: Ambulatory Visit | Attending: Cardiovascular Disease | Admitting: Cardiovascular Disease

## 2022-03-26 DIAGNOSIS — I5022 Chronic systolic (congestive) heart failure: Secondary | ICD-10-CM | POA: Insufficient documentation

## 2022-03-26 LAB — CBC
HCT: 32.5 % — ABNORMAL LOW (ref 36.0–46.0)
Hemoglobin: 10.6 g/dL — ABNORMAL LOW (ref 12.0–15.0)
MCH: 28 pg (ref 26.0–34.0)
MCHC: 32.6 g/dL (ref 30.0–36.0)
MCV: 86 fL (ref 80.0–100.0)
Platelets: 347 10*3/uL (ref 150–400)
RBC: 3.78 MIL/uL — ABNORMAL LOW (ref 3.87–5.11)
RDW: 15.6 % — ABNORMAL HIGH (ref 11.5–15.5)
WBC: 4.8 10*3/uL (ref 4.0–10.5)
nRBC: 0 % (ref 0.0–0.2)

## 2022-03-26 LAB — BASIC METABOLIC PANEL
Anion gap: 10 (ref 5–15)
BUN: 23 mg/dL (ref 8–23)
CO2: 25 mmol/L (ref 22–32)
Calcium: 9.2 mg/dL (ref 8.9–10.3)
Chloride: 100 mmol/L (ref 98–111)
Creatinine, Ser: 0.99 mg/dL (ref 0.44–1.00)
GFR, Estimated: >60 mL/min (ref >60)
Glucose, Bld: 109 mg/dL — ABNORMAL HIGH (ref 70–99)
Potassium: 4.2 mmol/L (ref 3.5–5.1)
Sodium: 135 mmol/L (ref 135–145)

## 2022-03-28 NOTE — Pre-Procedure Instructions (Signed)
Instructed patient on the following items: Arrival time 1130 Nothing to eat or drink after midnight No meds AM of procedure Responsible person to drive you home and stay with you for 24 hrs Wash with special soap night before and morning of procedure  

## 2022-03-29 ENCOUNTER — Ambulatory Visit (HOSPITAL_COMMUNITY): Payer: Medicare Other

## 2022-03-29 ENCOUNTER — Other Ambulatory Visit: Payer: Self-pay

## 2022-03-29 ENCOUNTER — Ambulatory Visit (HOSPITAL_COMMUNITY)
Admission: RE | Admit: 2022-03-29 | Discharge: 2022-03-29 | Disposition: A | Discharge status: Home / Self Care | Payer: Medicare Other | Source: Ambulatory Visit | Attending: Cardiovascular Disease | Admitting: Cardiovascular Disease

## 2022-03-29 ENCOUNTER — Encounter (HOSPITAL_COMMUNITY)
Admission: RE | Disposition: A | Discharge status: Home / Self Care | Payer: Self-pay | Source: Ambulatory Visit | Attending: Cardiovascular Disease

## 2022-03-29 DIAGNOSIS — I2510 Atherosclerotic heart disease of native coronary artery without angina pectoris: Secondary | ICD-10-CM | POA: Insufficient documentation

## 2022-03-29 DIAGNOSIS — I110 Hypertensive heart disease with heart failure: Secondary | ICD-10-CM | POA: Diagnosis not present

## 2022-03-29 DIAGNOSIS — Z87891 Personal history of nicotine dependence: Secondary | ICD-10-CM | POA: Insufficient documentation

## 2022-03-29 DIAGNOSIS — I5022 Chronic systolic (congestive) heart failure: Secondary | ICD-10-CM | POA: Diagnosis not present

## 2022-03-29 DIAGNOSIS — Z950 Presence of cardiac pacemaker: Secondary | ICD-10-CM | POA: Diagnosis not present

## 2022-03-29 DIAGNOSIS — I255 Ischemic cardiomyopathy: Secondary | ICD-10-CM | POA: Diagnosis not present

## 2022-03-29 HISTORY — PX: ICD IMPLANT: EP1208

## 2022-03-29 LAB — GLUCOSE, CAPILLARY: Glucose-Capillary: 86 mg/dL (ref 70–99)

## 2022-03-29 SURGERY — ICD IMPLANT
Anesthesia: LOCAL

## 2022-03-29 MED ORDER — POVIDONE-IODINE 10 % EX SWAB
2.0000 | Freq: Once | CUTANEOUS | Status: AC
  Administered 2022-03-29: 2 via TOPICAL

## 2022-03-29 MED ORDER — CEFAZOLIN SODIUM-DEXTROSE 2-4 GM/100ML-% IV SOLN
INTRAVENOUS | Status: AC
  Filled 2022-03-29: qty 100

## 2022-03-29 MED ORDER — MIDAZOLAM HCL 5 MG/5ML IJ SOLN
INTRAMUSCULAR | Status: DC | PRN
  Administered 2022-03-29: 1 mg via INTRAVENOUS

## 2022-03-29 MED ORDER — SODIUM CHLORIDE 0.9 % IV SOLN
INTRAVENOUS | Status: AC
  Filled 2022-03-29: qty 2

## 2022-03-29 MED ORDER — LIDOCAINE HCL (PF) 1 % IJ SOLN
INTRAMUSCULAR | Status: AC
  Filled 2022-03-29: qty 30

## 2022-03-29 MED ORDER — FENTANYL CITRATE (PF) 100 MCG/2ML IJ SOLN
INTRAMUSCULAR | Status: AC
  Filled 2022-03-29: qty 2

## 2022-03-29 MED ORDER — ACETAMINOPHEN 325 MG PO TABS: 325.0000 mg | ORAL_TABLET | ORAL | Status: DC | PRN

## 2022-03-29 MED ORDER — CEFAZOLIN SODIUM-DEXTROSE 2-4 GM/100ML-% IV SOLN
2.0000 g | INTRAVENOUS | Status: AC
  Administered 2022-03-29: 2 g via INTRAVENOUS

## 2022-03-29 MED ORDER — ONDANSETRON HCL 4 MG/2ML IJ SOLN: 4.0000 mg | Freq: Four times a day (QID) | INTRAMUSCULAR | Status: DC | PRN

## 2022-03-29 MED ORDER — SODIUM CHLORIDE 0.9 % IV SOLN
80.0000 mg | INTRAVENOUS | Status: AC
  Administered 2022-03-29: 80 mg

## 2022-03-29 MED ORDER — SODIUM CHLORIDE 0.9 % IV SOLN: INTRAVENOUS | Status: DC

## 2022-03-29 MED ORDER — CHLORHEXIDINE GLUCONATE 4 % EX LIQD
4.0000 | Freq: Once | CUTANEOUS | Status: DC
  Filled 2022-03-29: qty 60

## 2022-03-29 MED ORDER — LIDOCAINE HCL (PF) 1 % IJ SOLN
INTRAMUSCULAR | Status: AC
  Filled 2022-03-29: qty 60

## 2022-03-29 MED ORDER — LIDOCAINE HCL (PF) 1 % IJ SOLN
INTRAMUSCULAR | Status: DC | PRN
  Administered 2022-03-29: 50 mL

## 2022-03-29 MED ORDER — HEPARIN (PORCINE) IN NACL 1000-0.9 UT/500ML-% IV SOLN
INTRAVENOUS | Status: DC | PRN
  Administered 2022-03-29: 500 mL

## 2022-03-29 MED ORDER — MIDAZOLAM HCL 5 MG/5ML IJ SOLN
INTRAMUSCULAR | Status: AC
  Filled 2022-03-29: qty 5

## 2022-03-29 MED ORDER — IOHEXOL 350 MG/ML SOLN
INTRAVENOUS | Status: DC | PRN
  Administered 2022-03-29: 10 mL

## 2022-03-29 MED ORDER — FENTANYL CITRATE (PF) 100 MCG/2ML IJ SOLN
INTRAMUSCULAR | Status: DC | PRN
  Administered 2022-03-29: 25 ug via INTRAVENOUS

## 2022-03-29 SURGICAL SUPPLY — 7 items
CABLE SURGICAL S-101-97-12 (CABLE) ×1 IMPLANT
ICD GALLANT VR CDVRA500Q (ICD Generator) IMPLANT
KIT MICROPUNCTURE NIT STIFF (SHEATH) IMPLANT
LEAD DURATA 7122Q-58CM (Lead) IMPLANT
PAD DEFIB RADIO PHYSIO CONN (PAD) ×1 IMPLANT
SHEATH 7FR PRELUDE SNAP 13 (SHEATH) IMPLANT
TRAY PACEMAKER INSERTION (PACKS) ×1 IMPLANT

## 2022-03-29 NOTE — Progress Notes (Signed)
Pt discharged to home with daughter who will drive and stay with pt x 24hrs.  Both given discharge instructions verbally and in writing and verbalize understanding.  VSS . Sling on

## 2022-03-29 NOTE — Discharge Instructions (Signed)

## 2022-03-29 NOTE — H&P (Signed)
Electrophysiology Office Note:    Date:  03/29/2022   ID:  Shelby Trevino, DOB [birth year], MRN [id number]  PCP:  [facility], [place] Providers Cardiologist:  None     Referring MD: No ref. provider found   History of Present Illness:    Shelby Trevino is a 72 y.o. female with a hx listed below, significant for AVNRT s/p ablation 2004, CAD with ischemic cardiomyopathy, CHFrEF referred to discuss ICD placement.  He was diagnosed with heart failure in May 2023.  Cath showed occlusive coronary disease in multiple territories.  Based upon the patient's risk factors and the question of early dementia, PCI of the LAD was performed rather than bypass surgery.  In follow-up, 6 months later, EF remained low at 30 to 35%. She has not been able to take entresto due to cost. Otherwise, she has been taking GDMT.  She reports that she is functional and enjoys her current quality of life. She works as a Building control surveyor. She affirms that she will be able to follow the post-op orders to not lift her arm above horizontal and can refrain from lifting anything heavier than a gallon of milk.   Past Medical History:  Diagnosis Date   AV nodal tachycardia 2004   with radiofrequency ablation by Dr. Donnel Saxon in 2004   CAD (coronary artery disease)    80% stenosis in moderate size diagonal on cath in 2004. with last adenosine Cardiolite study in 2007 showing no abnormalities   Depression    Diabetes mellitus    Dyslipidemia    GERD (gastroesophageal reflux disease)    Hyperlipidemia    Hypertension    Mitral valve prolapse    Osteoarthritis    Peripheral neuropathy    Renal insufficiency     Past Surgical History:  Procedure Laterality Date   CARDIAC CATHETERIZATION  01/21/2002   80% stenosis of moderate sized diagonal branch   CHOLECYSTECTOMY  1978   CORONARY ATHERECTOMY N/A 06/13/2021   Procedure: CORONARY ATHERECTOMY;  Surgeon: Leonie Man, MD;  Location: [place]  CV LAB;  Service: Cardiovascular;  Laterality: N/A;   CORONARY STENT INTERVENTION N/A 06/13/2021   Procedure: CORONARY STENT INTERVENTION;  Surgeon: Leonie Man, MD;  Location: [place] CV LAB;  Service: Cardiovascular;  Laterality: N/A;   INTRAVASCULAR IMAGING/OCT N/A 06/13/2021   Procedure: INTRAVASCULAR IMAGING/OCT;  Surgeon: Leonie Man, MD;  Location: [place] CV LAB;  Service: Cardiovascular;  Laterality: N/A;   LEFT BREAST BIOPSY     By Dr. Anthony Sar showed fibrocystic changes   LEFT HEART CATH AND CORONARY ANGIOGRAPHY N/A 06/13/2021   Procedure: LEFT HEART CATH AND CORONARY ANGIOGRAPHY;  Surgeon: Leonie Man, MD;  Location: [place] CV LAB;  Service: Cardiovascular;  Laterality: N/A;   LEFT HEART CATHETERIZATION WITH CORONARY ANGIOGRAM N/A 12/28/2013   Procedure: LEFT HEART CATHETERIZATION WITH CORONARY ANGIOGRAM;  Surgeon: Jettie Booze, MD;  [facility] C[facility] LAB;  Service: Cardiovascular;  Laterality: N/A;   RIGHT/LEFT HEART CATH AND CORONARY ANGIOGRAPHY N/A 06/08/2021   Procedure: RIGHT/LEFT HEART CATH AND CORONARY ANGIOGRAPHY;  Surgeon: Belva Crome, MD;  Location: [place] CV LAB;  Service: Cardiovascular;  Laterality: N/A;    Current Medications: Current Meds  Medication Sig   ascorbic acid (VITAMIN C) 500 MG tablet Take 1 tablet by mouth daily.   atorvastatin (LIPITOR) 80 MG tablet Take 1 tablet (80 mg total) by mouth at bedtime.   Cholecalciferol (VITAMIN D3) 5000 UNITS  CAPS Take 2,000 Units by mouth daily.   metFORMIN (GLUCOPHAGE-XR) 500 MG 24 hr tablet Take 1 tablet (500 mg total) by mouth 2 (two) times daily.   Multiple Vitamins-Minerals (CENTRUM SILVER PO) Take 1 tablet by mouth daily.       Allergies:   Latex   Social History   Socioeconomic History   Marital status: Divorced    Spouse name: Not on file   Number of children: Not on file   Years of education: Not on file   Highest education level: Not on file  Occupational History    Not on file  Tobacco Use   Smoking status: Former    Packs/day: 0.25    Years: 21.00    Additional pack years: 0.00    Total pack years: 5.25    Types: Cigarettes    Start date: 06/22/1966    Quit date: 07/23/1987    Years since quitting: 34.7   Smokeless tobacco: Never  Substance and Sexual Activity   Alcohol use: No    Alcohol/week: 0.0 standard drinks of alcohol   Drug use: No   Sexual activity: Not on file  Other Topics Concern   Not on file  Social History Narrative   Has one son and one daughter.   Has five grandchildren.   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 3 brothers died with cancer.   Social Determinants of Health   Financial Resource Strain: Medium Risk (06/28/2021)   Overall Financial Resource Strain (CARDIA)    Difficulty of Paying Living Expenses: Somewhat hard  Food Insecurity: Not on file  Transportation Needs: Not on file  Physical Activity: Not on file  Stress: Not on file  Social Connections: Not on file     Family History: The patient's family history includes Cancer in her brother and father; Pneumonia in her mother.  ROS:   Please see the history of present illness.    All other systems reviewed and are negative.  EKGs/Labs/Other Studies Reviewed Today:    TTEs: 12-24-21 and 06/2021 CMR 06/2021  EKG:  Last EKG results:  sinus rhythm   Recent Labs: 06/07/2021: TSH 1.572 06/09/2021: ALT 83 06/13/2021: Magnesium 2.3 11/16/2021: B Natriuretic Peptide 418.4 03/26/2022: BUN 23; Creatinine, Ser 0.99; Hemoglobin 10.6; Platelets 347; Potassium 4.2; Sodium 135     Physical Exam:    VS:  BP 127/77   Pulse 99   Temp 99.3 F (37.4 C) (Oral)   Resp 16   Ht 5\' 2"  (1.575 m)   Wt 54.4 kg   SpO2 99%   BMI 21.95 kg/m     Wt Readings from Last 3 Encounters:  03/29/22 54.4 kg  02/11/22 50.8 kg  02/07/22 51.3 kg     GEN: Thin, elderly-appearing, in no acute distress CARDIAC: RRR, no murmurs, rubs,  gallops RESPIRATORY:  Normal work of breathing MUSCULOSKELETAL: no edema    ASSESSMENT & PLAN:    Ischemic cardiomyopathy: I discussed the indication for the ICD placement procedure and the logistics, risks, potential benefit, and after care. I specifically explained that risks include but are not limited to infection, bleeding,damage to blood vessels, lung, and the heart -- but risk of prolonged hospitalization, need for surgery, or the event of stroke, heart attack, or death are low but not zero.   CHFrEF: EF remains < 35% despite GDMT.        Medication Adjustments/Labs and Tests Ordered: Current medicines are reviewed at length with the  patient today.  Concerns regarding medicines are outlined above.  Orders Placed This Encounter  Procedures   Glucose, capillary   Informed Consent Details: Physician/Practitioner Attestation; Transcribe to consent form and obtain patient signature   Initiate Pre-op Protocol   Apply Cardiac Implantable [place] on call to EP Lab   Electrode Placement Place arm electrodes on posterior shoulders   Confirm CBC and BMP (or CMP) results within 7 days for inpatient and 30 days for outpatient:   Pre-admission testing diagnosis   Use clippers to remove hair, entire chest area   SCRUB WITH Chlorhexidine (HIBICLENS) 4%   EP PPM/ICD IMPLANT   Insert peripheral IV   Meds ordered this encounter  Medications   0.9 %  sodium chloride infusion   gentamicin (GARAMYCIN) 80 mg in sodium chloride 0.9 % 500 mL irrigation   chlorhexidine (HIBICLENS) 4 % liquid 4 Application   0.9 %  sodium chloride infusion   ceFAZolin (ANCEF) IVPB 2g/100 mL premix    Order Specific Question:   Indication:    Answer:   Surgical Prophylaxis   povidone-iodine 10 % swab 2 Application     Signed, Melida Quitter, MD  03/29/2022 1:48 PM    [place]

## 2022-04-01 ENCOUNTER — Telehealth: Payer: Self-pay

## 2022-04-01 ENCOUNTER — Encounter (HOSPITAL_COMMUNITY): Payer: Self-pay | Admitting: Cardiovascular Disease

## 2022-04-01 NOTE — Telephone Encounter (Signed)
-----   Message from [facility], [place] sent at 03/29/2022  3:45 PM EDT ----- Same day d/c  [employer]ott ICD AM Plavix to  resume Monday 04/01/22

## 2022-04-01 NOTE — Telephone Encounter (Signed)
Attempted same day D/C. No answer, LMTCB.

## 2022-04-02 NOTE — Telephone Encounter (Signed)
Outreach  made to Pt.  She is doing well.  Site is mildly sore, but looks good. No questions/issues.  Follow-up after same day discharge: Implant date: 03/29/2022 MD: Dr. Myles Gip Device: SJ ICD   Wound check visit: 04/10/2022 at 2:00 pm 90 day MD follow-up: scheduled   Remote Transmission received:yes  Dressing/sling removed: yes  Confirm [place] restart on: Plavix 04/01/22

## 2022-04-10 ENCOUNTER — Ambulatory Visit: Payer: Medicare Other

## 2022-04-13 DIAGNOSIS — Z1152 Encounter for screening for COVID-19: Secondary | ICD-10-CM | POA: Diagnosis not present

## 2022-04-13 DIAGNOSIS — Z6820 Body mass index (BMI) 20.0-20.9, adult: Secondary | ICD-10-CM | POA: Diagnosis not present

## 2022-04-13 DIAGNOSIS — Z23 Encounter for immunization: Secondary | ICD-10-CM | POA: Diagnosis not present

## 2022-04-14 NOTE — Progress Notes (Unsigned)
Cardiology Office Note Date:  04/14/2022  Patient ID:  Shelby Trevino, DOB September 17, 1950, MRN [id number] PCP:  [facility], [facility]  Cardiologist:  *** Electrophysiologist: Dr. Nelly LaurenceMealor  ***refresh   Chief Complaint: *** wound check  History of Present Illness: Shelby Trevino is a 72 y.o. female with history of SVT (AVNRT ablated 2004), HTN, HLD, DM, CAD, ICM (bive failure), dementia   Admitted 5/23 with acute systolic CHF. Echo showed EF 20-25%, RWMA in LAD territory, RV moderately reduced, moderate MR. Underwent R/LHC showing totally occluded p to m RCA which fills by collaterals from Cx and LAD, Cx okay, 95% p to m LAD, 95% D1, RA mean 14, PA 40/18 (28), PCWP mean 23, LVEDP 27, Fick CO/CI 3.84/2.22. CT surgery and AHF consulted. She was diuresed further w/ IV Lasix. GDMT initiated. cMRI showed delayed enhancement imaging suggesting minimal viability in the basal to mid inferior and inferoseptal walls. The mid to apical anteroseptal and anterior walls were borderline in terms of expected viability with revascularization, with 50% wall thickness LGE. LVEF 23%, RVEF 24%, mod MR. cMRI reviewed with Dr. Laneta SimmersBartle. With severe RV dysfunction, borderline viability, and some question of early dementia, decided she would be best served by PCI rather than CABG. She underwent successful PCI to LAD.   Referred to EP, saw Dr. Nelly LaurenceMealor 02/11/22, planned for ICD  *** site *** restrictions *** acute outputs   Device information Abbot single chamber ICD implanted 03/29/22   Past Medical History:  Diagnosis Date   AV nodal tachycardia 2004   with radiofrequency ablation by Dr. Grant Fontanaaylo in 2004   CAD (coronary artery disease)    80% stenosis in moderate size diagonal on cath in 2004. with last adenosine Cardiolite study in 2007 showing no abnormalities   Depression    Diabetes mellitus    Dyslipidemia    GERD (gastroesophageal reflux disease)    Hyperlipidemia    Hypertension    Mitral valve  prolapse    Osteoarthritis    Peripheral neuropathy    Renal insufficiency     Past Surgical History:  Procedure Laterality Date   CARDIAC CATHETERIZATION  01/21/2002   80% stenosis of moderate sized diagonal branch   CHOLECYSTECTOMY  1978   CORONARY ATHERECTOMY N/A 06/13/2021   Procedure: CORONARY ATHERECTOMY;  Surgeon: Marykay LexHarding, David W, MD;  [facility];  Service: Cardiovascular;  Laterality: N/A;   CORONARY IMAGING/OCT N/A 06/13/2021   Procedure: INTRAVASCULAR IMAGING/OCT;  Surgeon: Marykay LexHarding, David W, MD;  [facility];  Service: Cardiovascular;  Laterality: N/A;   CORONARY STENT INTERVENTION N/A 06/13/2021   Procedure: CORONARY STENT INTERVENTION;  Surgeon: Marykay LexHarding, David W, MD;  [facility];  Service: Cardiovascular;  Laterality: N/A;   ICD IMPLANT N/A 03/29/2022   Procedure: ICD IMPLANT;  Surgeon: Maurice SmallMealor, Augustus E, MD;  [facility];  Service: Cardiovascular;  Laterality: N/A;   LEFT BREAST BIOPSY     By Dr. Gabriel CirrieMason showed fibrocystic changes   LEFT HEART CATH AND CORONARY ANGIOGRAPHY N/A 06/13/2021   Procedure: LEFT HEART CATH AND CORONARY ANGIOGRAPHY;  Surgeon: Marykay LexHarding, David W, MD;  [facility];  Service: Cardiovascular;  Laterality: N/A;   LEFT HEART CATHETERIZATION WITH CORONARY ANGIOGRAM N/A 12/28/2013   Procedure: LEFT HEART CATHETERIZATION WITH CORONARY ANGIOGRAM;  Surgeon: Corky CraftsJayadeep S Varanasi, MD;  [facility];  Service: Cardiovascular;  Laterality: N/A;   RIGHT/LEFT HEART CATH AND CORONARY ANGIOGRAPHY N/A  06/08/2021   Procedure: RIGHT/LEFT HEART CATH AND CORONARY ANGIOGRAPHY;  Surgeon: Lyn Records, MD;  [facility] INVAS[facility] LAB;  Service: Cardiovascular;  Laterality: N/A;    Current Outpatient Medications  Medication Sig Dispense Refill   ascorbic acid (VITAMIN C) 500 MG tablet Take 1 tablet by mouth daily.     aspirin EC 81 MG tablet Take 1 tablet (81 mg total) by mouth daily. 30  tablet 6   atorvastatin (LIPITOR) 80 MG tablet Take 1 tablet (80 mg total) by mouth at bedtime. 30 tablet 5   Cholecalciferol (VITAMIN D3) 5000 UNITS CAPS Take 2,000 Units by mouth daily.     clopidogrel (PLAVIX) 75 MG tablet Take 1 tablet (75 mg total) by mouth daily. 30 tablet 11   clotrimazole-betamethasone (LOTRISONE) cream Apply 1 application. topically 2 (two) times daily.     furosemide (LASIX) 40 MG tablet Take 1 tablet (40 mg total) by mouth as needed. 30 tablet 2   losartan (COZAAR) 25 MG tablet Take 0.5 tablets (12.5 mg total) by mouth daily. 30 tablet 11   metFORMIN (GLUCOPHAGE-XR) 500 MG 24 hr tablet Take 1 tablet (500 mg total) by mouth 2 (two) times daily.     metoprolol succinate (TOPROL-XL) 25 MG 24 hr tablet Take 1/2 tablet (12.5 mg total) by mouth daily. 30 tablet 5   Multiple Vitamins-Minerals (CENTRUM SILVER PO) Take 1 tablet by mouth daily.       nitroGLYCERIN (NITROSTAT) 0.4 MG SL tablet Place 1 tablet (0.4 mg total) under the tongue every 5 (five) minutes as needed for chest pain. 25 tablet 2   pantoprazole (PROTONIX) 40 MG tablet Take 1 tablet (40 mg total) by mouth 2 (two) times daily. 40 mg 1 tablet twice a day 60 tablet 6   spironolactone (ALDACTONE) 25 MG tablet TAKE 1 TABLET ONCE DAILY. 30 tablet 5   vitamin E 200 UNIT capsule Take by mouth.     No current facility-administered medications for this visit.    Allergies:   Latex   Social History:  The patient  reports that she quit smoking about 34 years ago. Her smoking use included cigarettes. She started smoking about 55 years ago. She has a 5.25 pack-year smoking history. She has never used smokeless tobacco. She reports that she does not drink alcohol and does not use drugs.   Family History:  The patient's family history includes Cancer in her brother and father; Pneumonia in her mother.  ROS:  Please see the history of present illness.    All other systems are reviewed and otherwise negative.   PHYSICAL  EXAM:  VS:  There were no vitals taken for this visit. BMI: There is no height or weight on file to calculate BMI. Well nourished, well developed, in no acute distress HEENT: normocephalic, atraumatic Neck: no JVD, carotid bruits or masses Cardiac:  *** RRR; no significant murmurs, no rubs, or gallops Lungs:  *** CTA b/l, no wheezing, rhonchi or rales Abd: soft, nontender MS: no deformity or *** atrophy Ext: *** no edema Skin: warm and dry, no rash Neuro:  No gross deficits appreciated Psych: euthymic mood, full affect  *** ICD *** site is stable, no tethering or discomfort   EKG:  not done today  Device interrogation done today and reviewed by myself:  ***   11/16/21: TTE 1. Left ventricular ejection fraction, by estimation, is 30 to 35%. The  left ventricle has moderately decreased function. The left ventricle  demonstrates global hypokinesis  with basal to mid inferior akinesis. There  is mild concentric left ventricular  hypertrophy. Left ventricular diastolic parameters are consistent with  Grade I diastolic dysfunction (impaired relaxation).   2. Right ventricular systolic function is normal. The right ventricular  size is normal. There is normal pulmonary artery systolic pressure. The  estimated right ventricular systolic pressure is 24.0 mmHg.   3. Left atrial size was mildly dilated.   4. The mitral valve is normal in structure. Mild mitral valve  regurgitation. No evidence of mitral stenosis.   5. The aortic valve is tricuspid. There is mild calcification of the  aortic valve. Aortic valve regurgitation is not visualized. No aortic  stenosis is present.   6. The inferior vena cava is normal in size with greater than 50%  respiratory variability, suggesting right atrial pressure of 3 mmHg.   7. A small pericardial effusion is present. The pericardial effusion is  localized near the right ventricle.    06/13/21: PCI Prox LAD lesion is 55% stenosed with 95% stenosed  side branch in 1st Diag.   -------------------------------------------   Culprit Lesion Segment: Mid LAD-1 lesion is 90% stenosed.  Mid LAD-2 lesion is 95% stenosed.   Orbital atherectomy followed by balloon angioplasty was performed on both lesions   Balloon angioplasty was performed on both lesion sites using a BALLN SAPPHIRE 2.5X15.=> Followed by OCT imaging   A drug-eluting stent was successfully placed covering both lesions (careful not to cover the more proximal 55% lesion-or approach 3rdDiag/distal LAD lesion) using a SYNERGY XD 3.0X28 -> deployed to 3.2 mm   Post intervention, there is a 0% residual stenosis.   -------------------------------------------   Dist LAD lesion is 65% stenosed.   Prox RCA lesion is 100% stenosed -PDA fills via septal collaterals.   LV end diastolic pressure is mildly elevated.   Successful Orbital Atherectomy and OCT guided DES PCI of proximal to mid LAD covering 2 major lesions with a Synergy DES 3.0 mm x 28 mm deployed to 3.2 mm   06/08/21: [facility] CONCLUSIONS: Ischemic cardiomyopathy with acute on chronic systolic heart failure, LVEDP 27 mmHg. CPO = 0.71. Widely patent left main Proximal to mid segmental calcified 95% LAD.  First diagonal of moderate size also 95% obstructed.  LAD and circumflex supply collaterals to the right coronary. Circumflex is widely patent with irregularities but no high-grade obstruction. Totally occluded proximal to mid right coronary.  Fills by left to right collaterals. Mild pulmonary artery hypertension with mean PA pressure 28 mmHg.  Etiology likely WHO group 2 etiology.  Capillary wedge mean pressure 23 mmHg.  Pulmonary vascular resistance 1.3 Wood units Elevated right atrial pressure, mean pressure 14 mmHg. PAPI = 1.6 Cardiac output 3.84 L/min; cardiac index 2.22 L/min/m. CPO = 0.71   Recommendations: Treat right and left heart failure. Consider PCI on LAD with plaque modification by orbital atherectomy or shockwave if  anterior wall viability. Advanced heart failure team consult.   Recent Labs: 06/07/2021: TSH 1.572 06/09/2021: ALT 83 06/13/2021: Magnesium 2.3 11/16/2021: B Natriuretic Peptide 418.4 03/26/2022: BUN 23; Creatinine, Ser 0.99; Hemoglobin 10.6; Platelets 347; Potassium 4.2; Sodium 135  11/16/2021: Cholesterol 197; HDL 63; LDL Cholesterol 120; Total CHOL/HDL Ratio 3.1; Triglycerides 72; VLDL 14   CrCl cannot be calculated (Unknown ideal weight.).   Wt Readings from Last 3 Encounters:  03/29/22 120 lb (54.4 kg)  02/11/22 112 lb (50.8 kg)  02/07/22 113 lb (51.3 kg)     Other studies reviewed: Additional studies/records reviewed today include:  summarized above  ASSESSMENT AND PLAN:  ICD ***   ICM Chronic CHF (bive failure) ***  CAD ***  Disposition: F/u with ***  Current medicines are reviewed at length with the patient today.  The patient did not have any concerns regarding medicines.  Norma Fredrickson, PA-C 04/14/2022 8:45 AM     [facility] [address] [place] [address] [phone] (office)  [phone] (fax)

## 2022-04-15 ENCOUNTER — Ambulatory Visit: Payer: Medicare Other | Admitting: Physician Assistant

## 2022-04-15 ENCOUNTER — Ambulatory Visit: Payer: Medicare Other | Attending: Cardiology

## 2022-04-15 DIAGNOSIS — I255 Ischemic cardiomyopathy: Secondary | ICD-10-CM | POA: Diagnosis not present

## 2022-04-15 LAB — CUP PACEART INCLINIC DEVICE CHECK
Battery Remaining Longevity: 122 mo
Brady Statistic RV Percent Paced: 0 %
Date Time Interrogation Session: 20240408145838
HighPow Impedance: 54 Ohm
Implantable Lead Connection Status: 753985
Implantable Lead Implant Date: 20240322
Implantable Lead Location: 753860
Implantable Lead Model: 7122
Implantable Pulse Generator Implant Date: 20240322
Lead Channel Impedance Value: 462.5 Ohm
Lead Channel Pacing Threshold Amplitude: 0.5 V
Lead Channel Pacing Threshold Amplitude: 0.5 V
Lead Channel Pacing Threshold Pulse Width: 0.5 ms
Lead Channel Pacing Threshold Pulse Width: 0.5 ms
Lead Channel Sensing Intrinsic Amplitude: 12 mV
Lead Channel Setting Pacing Amplitude: 3.5 V
Lead Channel Setting Pacing Pulse Width: 0.5 ms
Lead Channel Setting Sensing Sensitivity: 0.5 mV
Pulse Gen Serial Number: 211016498

## 2022-04-15 NOTE — Patient Instructions (Signed)

## 2022-04-15 NOTE — Patient Instructions (Signed)

## 2022-04-15 NOTE — Progress Notes (Signed)

## 2022-06-04 DIAGNOSIS — E7849 Other hyperlipidemia: Secondary | ICD-10-CM | POA: Diagnosis not present

## 2022-06-04 DIAGNOSIS — E1159 Type 2 diabetes mellitus with other circulatory complications: Secondary | ICD-10-CM | POA: Diagnosis not present

## 2022-06-04 DIAGNOSIS — I10 Essential (primary) hypertension: Secondary | ICD-10-CM | POA: Diagnosis not present

## 2022-06-04 DIAGNOSIS — K219 Gastro-esophageal reflux disease without esophagitis: Secondary | ICD-10-CM | POA: Diagnosis not present

## 2022-06-11 DIAGNOSIS — I2510 Atherosclerotic heart disease of native coronary artery without angina pectoris: Secondary | ICD-10-CM | POA: Diagnosis not present

## 2022-06-11 DIAGNOSIS — E7849 Other hyperlipidemia: Secondary | ICD-10-CM | POA: Diagnosis not present

## 2022-06-11 DIAGNOSIS — Z6821 Body mass index (BMI) 21.0-21.9, adult: Secondary | ICD-10-CM | POA: Diagnosis not present

## 2022-06-11 DIAGNOSIS — E1159 Type 2 diabetes mellitus with other circulatory complications: Secondary | ICD-10-CM | POA: Diagnosis not present

## 2022-06-11 DIAGNOSIS — I255 Ischemic cardiomyopathy: Secondary | ICD-10-CM | POA: Diagnosis not present

## 2022-06-11 DIAGNOSIS — N182 Chronic kidney disease, stage 2 (mild): Secondary | ICD-10-CM | POA: Diagnosis not present

## 2022-06-11 DIAGNOSIS — Z91199 Patient's noncompliance with other medical treatment and regimen due to unspecified reason: Secondary | ICD-10-CM | POA: Diagnosis not present

## 2022-06-11 DIAGNOSIS — I10 Essential (primary) hypertension: Secondary | ICD-10-CM | POA: Diagnosis not present

## 2022-06-11 DIAGNOSIS — I5022 Chronic systolic (congestive) heart failure: Secondary | ICD-10-CM | POA: Diagnosis not present

## 2022-06-11 DIAGNOSIS — E119 Type 2 diabetes mellitus without complications: Secondary | ICD-10-CM | POA: Diagnosis not present

## 2022-07-01 ENCOUNTER — Ambulatory Visit (INDEPENDENT_AMBULATORY_CARE_PROVIDER_SITE_OTHER): Payer: Medicare Other

## 2022-07-01 DIAGNOSIS — I5022 Chronic systolic (congestive) heart failure: Secondary | ICD-10-CM

## 2022-07-01 DIAGNOSIS — I255 Ischemic cardiomyopathy: Secondary | ICD-10-CM | POA: Diagnosis not present

## 2022-07-02 LAB — CUP PACEART REMOTE DEVICE CHECK
Battery Remaining Longevity: 111 mo
Battery Remaining Percentage: 95.5 %
Battery Voltage: 3.1 V
Brady Statistic RV Percent Paced: 1 %
Date Time Interrogation Session: 20240624020100
HighPow Impedance: 64 Ohm
Implantable Lead Connection Status: 753985
Implantable Lead Implant Date: 20240322
Implantable Lead Location: 753860
Implantable Lead Model: 7122
Implantable Pulse Generator Implant Date: 20240322
Lead Channel Impedance Value: 500 Ohm
Lead Channel Pacing Threshold Amplitude: 0.5 V
Lead Channel Pacing Threshold Pulse Width: 0.5 ms
Lead Channel Sensing Intrinsic Amplitude: 12 mV
Lead Channel Setting Pacing Amplitude: 3.5 V
Lead Channel Setting Pacing Pulse Width: 0.5 ms
Lead Channel Setting Sensing Sensitivity: 0.5 mV
Pulse Gen Serial Number: 211016498

## 2022-07-04 ENCOUNTER — Encounter: Payer: Self-pay | Admitting: Cardiovascular Disease

## 2022-07-04 ENCOUNTER — Ambulatory Visit: Payer: Medicare Other | Attending: Cardiovascular Disease | Admitting: Cardiovascular Disease

## 2022-07-04 VITALS — BP 114/80 | HR 88 | Ht 62.0 in | Wt 122.0 lb

## 2022-07-04 DIAGNOSIS — I471 Supraventricular tachycardia, unspecified: Secondary | ICD-10-CM | POA: Diagnosis not present

## 2022-07-04 DIAGNOSIS — I493 Ventricular premature depolarization: Secondary | ICD-10-CM

## 2022-07-04 DIAGNOSIS — I255 Ischemic cardiomyopathy: Secondary | ICD-10-CM

## 2022-07-04 NOTE — Patient Instructions (Signed)
Medication Instructions:  Your physician recommends that you continue on your current medications as directed. Please refer to the Current Medication list given to you today. *If you need a refill on your cardiac medications before your next appointment, please call your pharmacy*   Follow-Up: At [facility], you and your health needs are our priority.  As part of our continuing mission to provide you with exceptional heart care, we have created designated Provider Care Teams.  These Care Teams include your primary Cardiologist (physician) and Advanced Practice Providers (APPs -  Physician Assistants and Nurse Practitioners) who all work together to provide you with the care you need, when you need it.  We recommend signing up for the patient portal called "MyChart".  Sign up information is provided on this After Visit Summary.  MyChart is used to connect with patients for Virtual Visits (Telemedicine).  Patients are able to view lab/test results, encounter notes, upcoming appointments, etc.  Non-urgent messages can be sent to your provider as well.   To learn more about what you can do with MyChart, go to [email or web address].    Your next appointment:   1 year(s)  Provider:   Augustus Mealor, MD  

## 2022-07-04 NOTE — Progress Notes (Signed)
Electrophysiology Office Note:    Date:  07/04/2022   ID:  Shelby Trevino, DOB 08-03-1950, MRN [id number]  PCP:  [facility]   [facility] Providers Cardiologist:  None     Referring MD: [facility]   History of Present Illness:    Shelby Trevino is a 72 y.o. female with a hx listed below, significant for AVNRT s/p ablation 2004, CAD with ischemic cardiomyopathy, CHFrEF here for device follow-up.  He was diagnosed with heart failure in May 2023.  Cath showed occlusive coronary disease in multiple territories.  Based upon the patient's risk factors and the question of early dementia, PCI of the LAD was performed rather than bypass surgery.  In follow-up, 6 months later, EF remained low at 30 to 35%. She has not been able to take entresto due to cost. Otherwise, she has been taking GDMT.  She underwent placement of an Abbott single-chamber ICD in March 2024. she has no device related complaints -- no new tenderness, drainage, redness.    Past Medical History:  Diagnosis Date   AV nodal tachycardia 2004   with radiofrequency ablation by Dr. Grant Fontana in 2004   CAD (coronary artery disease)    80% stenosis in moderate size diagonal on cath in 2004. with last adenosine Cardiolite study in 2007 showing no abnormalities   Depression    Diabetes mellitus    Dyslipidemia    GERD (gastroesophageal reflux disease)    Hyperlipidemia    Hypertension    Mitral valve prolapse    Osteoarthritis    Peripheral neuropathy    Renal insufficiency     Past Surgical History:  Procedure Laterality Date   CARDIAC CATHETERIZATION  01/21/2002   80% stenosis of moderate sized diagonal branch   CHOLECYSTECTOMY  1978   CORONARY ATHERECTOMY N/A 06/13/2021   Procedure: CORONARY ATHERECTOMY;  Surgeon: Marykay Lex, MD;  Location: [facility] INVASIVE CV LAB;  Service: Cardiovascular;  Laterality: N/A;   CORONARY IMAGING/OCT N/A 06/13/2021   Procedure: INTRAVASCULAR  IMAGING/OCT;  Surgeon: Marykay Lex, MD;  Location: [facility] INVASIVE CV LAB;  Service: Cardiovascular;  Laterality: N/A;   CORONARY STENT INTERVENTION N/A 06/13/2021   Procedure: CORONARY STENT INTERVENTION;  Surgeon: Marykay Lex, MD;  Location: [facility] INVASIVE CV LAB;  Service: Cardiovascular;  Laterality: N/A;   ICD IMPLANT N/A 03/29/2022   Procedure: ICD IMPLANT;  Surgeon: Maurice Small, MD;  Location: [facility] INVASIVE CV LAB;  Service: Cardiovascular;  Laterality: N/A;   LEFT BREAST BIOPSY     By Dr. Gabriel Cirri showed fibrocystic changes   LEFT HEART CATH AND CORONARY ANGIOGRAPHY N/A 06/13/2021   Procedure: LEFT HEART CATH AND CORONARY ANGIOGRAPHY;  Surgeon: Marykay Lex, MD;  Location: [facility] INVASIVE CV LAB;  Service: Cardiovascular;  Laterality: N/A;   LEFT HEART CATHETERIZATION WITH CORONARY ANGIOGRAM N/A 12/28/2013   Procedure: LEFT HEART CATHETERIZATION WITH CORONARY ANGIOGRAM;  Surgeon: Corky Crafts, MD;  Location: [facility] CATH LAB;  Service: Cardiovascular;  Laterality: N/A;   RIGHT/LEFT HEART CATH AND CORONARY ANGIOGRAPHY N/A 06/08/2021   Procedure: RIGHT/LEFT HEART CATH AND CORONARY ANGIOGRAPHY;  Surgeon: Lyn Records, MD;  Location: [facility] INVASIVE CV LAB;  Service: Cardiovascular;  Laterality: N/A;    Current Medications: Current Meds  Medication Sig   ascorbic acid (VITAMIN C) 500 MG tablet Take 1 tablet by mouth daily.   aspirin EC 81 MG tablet Take 1 tablet (81 mg total) by mouth daily.   atorvastatin (LIPITOR) 80 MG  tablet Take 1 tablet (80 mg total) by mouth at bedtime.   Cholecalciferol (VITAMIN D3) 5000 UNITS CAPS Take 2,000 Units by mouth daily.   clopidogrel (PLAVIX) 75 MG tablet Take 1 tablet (75 mg total) by mouth daily.   furosemide (LASIX) 40 MG tablet Take 1 tablet (40 mg total) by mouth as needed.   losartan (COZAAR) 25 MG tablet Take 0.5 tablets (12.5 mg total) by mouth daily.   metFORMIN (GLUCOPHAGE-XR) 500 MG 24 hr tablet Take 1 tablet (500 mg total) by mouth 2 (two)  times daily.   metoprolol succinate (TOPROL-XL) 25 MG 24 hr tablet Take 1/2 tablet (12.5 mg total) by mouth daily.   Multiple Vitamins-Minerals (CENTRUM SILVER PO) Take 1 tablet by mouth daily.     nitroGLYCERIN (NITROSTAT) 0.4 MG SL tablet Place 1 tablet (0.4 mg total) under the tongue every 5 (five) minutes as needed for chest pain.   pantoprazole (PROTONIX) 40 MG tablet Take 1 tablet (40 mg total) by mouth 2 (two) times daily. 40 mg 1 tablet twice a day   spironolactone (ALDACTONE) 25 MG tablet TAKE 1 TABLET ONCE DAILY.   vitamin E 200 UNIT capsule Take by mouth.     Allergies:   Latex   Social History   Socioeconomic History   Marital status: Divorced    Spouse name: Not on file   Number of children: Not on file   Years of education: Not on file   Highest education level: Not on file  Occupational History   Not on file  Tobacco Use   Smoking status: Former    Packs/day: 0.25    Years: 21.00    Additional pack years: 0.00    Total pack years: 5.25    Types: Cigarettes    Start date: 06/22/1966    Quit date: 07/23/1987    Years since quitting: 34.9   Smokeless tobacco: Never  Substance and Sexual Activity   Alcohol use: No    Alcohol/week: 0.0 standard drinks of alcohol   Drug use: No   Sexual activity: Not on file  Other Topics Concern   Not on file  Social History Narrative   Has one son and one daughter.   Has five grandchildren.   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 3 brothers died with cancer.   Social Determinants of Health   Financial Resource Strain: Medium Risk (06/28/2021)   Overall Financial Resource Strain (CARDIA)    Difficulty of Paying Living Expenses: Somewhat hard  Food Insecurity: Not on file  Transportation Needs: Not on file  Physical Activity: Not on file  Stress: Not on file  Social Connections: Not on file     Family History: The patient's family history includes Cancer in her brother and father; Pneumonia  in her mother.  ROS:   Please see the history of present illness.    All other systems reviewed and are negative.  EKGs/Labs/Other Studies Reviewed Today:    TTEs: 12-03-2021 and 06/2021 CMR 06/2021  EKG:  Last EKG results: today - sinus rhythm   Recent Labs: December 03, 2021: B Natriuretic Peptide 418.4 03/26/2022: BUN 23; Creatinine, Ser 0.99; Hemoglobin 10.6; Platelets 347; Potassium 4.2; Sodium 135     Physical Exam:    VS:  BP 114/80   Pulse 88   Ht 5\' 2"  (1.575 m)   Wt 122 lb (55.3 kg)   SpO2 98%   BMI 22.31 kg/m     Wt Readings from  Last 3 Encounters:  07/04/22 122 lb (55.3 kg)  03/29/22 120 lb (54.4 kg)  02/11/22 112 lb (50.8 kg)     GEN: Thin, elderly-appearing, in no acute distress CARDIAC: RRR, no murmurs, rubs, gallops RESPIRATORY:  Normal work of breathing MUSCULOSKELETAL: no edema    ASSESSMENT & PLAN:    Ischemic cardiomyopathy:  Abbott ICD in place, functioning normally I reviewed the device interrogation today in detail.  See Paceart  CHFrEF:  EF remains < 35% despite GDMT. Continue losartan 25, Toprol 25, spironolactone 25        Medication Adjustments/Labs and Tests Ordered: Current medicines are reviewed at length with the patient today.  Concerns regarding medicines are outlined above.  Orders Placed This Encounter  Procedures   EKG 12-Lead   No orders of the defined types were placed in this encounter.    Signed, Maurice Small, MD  07/04/2022 3:33 PM    [facility]

## 2022-07-18 NOTE — Progress Notes (Signed)
Remote ICD transmission.   

## 2022-08-15 ENCOUNTER — Other Ambulatory Visit (HOSPITAL_COMMUNITY): Payer: Self-pay | Admitting: Family Medicine

## 2022-08-15 DIAGNOSIS — I2510 Atherosclerotic heart disease of native coronary artery without angina pectoris: Secondary | ICD-10-CM

## 2022-08-15 DIAGNOSIS — E119 Type 2 diabetes mellitus without complications: Secondary | ICD-10-CM

## 2022-09-30 ENCOUNTER — Ambulatory Visit (INDEPENDENT_AMBULATORY_CARE_PROVIDER_SITE_OTHER): Payer: Medicare Other

## 2022-09-30 DIAGNOSIS — I255 Ischemic cardiomyopathy: Secondary | ICD-10-CM

## 2022-09-30 DIAGNOSIS — I5022 Chronic systolic (congestive) heart failure: Secondary | ICD-10-CM

## 2022-09-30 LAB — CUP PACEART REMOTE DEVICE CHECK
Battery Remaining Longevity: 118 mo
Battery Remaining Percentage: 95 %
Battery Voltage: 3.05 V
Brady Statistic RV Percent Paced: 1 %
Date Time Interrogation Session: 20240923020719
HighPow Impedance: 69 Ohm
Implantable Lead Connection Status: 753985
Implantable Lead Implant Date: 20240322
Implantable Lead Location: 753860
Implantable Lead Model: 7122
Implantable Pulse Generator Implant Date: 20240322
Lead Channel Impedance Value: 500 Ohm
Lead Channel Pacing Threshold Amplitude: 0.5 V
Lead Channel Pacing Threshold Pulse Width: 0.5 ms
Lead Channel Sensing Intrinsic Amplitude: 12 mV
Lead Channel Setting Pacing Amplitude: 2 V
Lead Channel Setting Pacing Pulse Width: 0.5 ms
Lead Channel Setting Sensing Sensitivity: 0.5 mV
Pulse Gen Serial Number: 211016498

## 2022-10-11 NOTE — Progress Notes (Signed)
Remote ICD transmission.   

## 2022-12-03 DIAGNOSIS — E1159 Type 2 diabetes mellitus with other circulatory complications: Secondary | ICD-10-CM | POA: Diagnosis not present

## 2022-12-03 DIAGNOSIS — E7849 Other hyperlipidemia: Secondary | ICD-10-CM | POA: Diagnosis not present

## 2022-12-03 DIAGNOSIS — I2510 Atherosclerotic heart disease of native coronary artery without angina pectoris: Secondary | ICD-10-CM | POA: Diagnosis not present

## 2022-12-03 DIAGNOSIS — I10 Essential (primary) hypertension: Secondary | ICD-10-CM | POA: Diagnosis not present

## 2022-12-03 DIAGNOSIS — N182 Chronic kidney disease, stage 2 (mild): Secondary | ICD-10-CM | POA: Diagnosis not present

## 2022-12-03 DIAGNOSIS — I471 Supraventricular tachycardia, unspecified: Secondary | ICD-10-CM | POA: Diagnosis not present

## 2022-12-03 DIAGNOSIS — I255 Ischemic cardiomyopathy: Secondary | ICD-10-CM | POA: Diagnosis not present

## 2022-12-03 DIAGNOSIS — E782 Mixed hyperlipidemia: Secondary | ICD-10-CM | POA: Diagnosis not present

## 2022-12-03 DIAGNOSIS — E119 Type 2 diabetes mellitus without complications: Secondary | ICD-10-CM | POA: Diagnosis not present

## 2022-12-03 DIAGNOSIS — Z6820 Body mass index (BMI) 20.0-20.9, adult: Secondary | ICD-10-CM | POA: Diagnosis not present

## 2022-12-03 DIAGNOSIS — I5022 Chronic systolic (congestive) heart failure: Secondary | ICD-10-CM | POA: Diagnosis not present

## 2022-12-03 DIAGNOSIS — Z91199 Patient's noncompliance with other medical treatment and regimen due to unspecified reason: Secondary | ICD-10-CM | POA: Diagnosis not present

## 2022-12-25 ENCOUNTER — Other Ambulatory Visit: Payer: Self-pay

## 2022-12-25 MED ORDER — SPIRONOLACTONE 25 MG PO TABS: 25.0000 mg | ORAL_TABLET | Freq: Every day | ORAL | 5 refills | Status: AC

## 2022-12-25 NOTE — Progress Notes (Signed)
Cardiology Office Note Date:  12/27/2022  Patient ID:  Shelby Trevino, Shelby Trevino [birth year], MRN [id number] PCP:  [facility]  Cardiologist:  Dr. Shirlee Latch Electrophysiologist: Dr. Nelly Laurence     Chief Complaint:  f/u per PMD  History of Present Illness: Shelby Trevino is a 72 y.o. female with history of SVT (AVNRT ablated 2004), HTN, HLD, DM, CAD, ICM (bive failure), dementia   Admitted 05/2021 with acute systolic CHF.  Echo showed EF 20-25%, RWMA in LAD territory, RV moderately reduced, moderate MR.  Underwent R/LHC showing totally occluded p to m RCA which fills by collaterals from Cx and LAD, Cx okay, 95% p to m LAD, 95% D1, RA mean 14, PA 40/18 (28), PCWP mean 23, LVEDP 27, Fick CO/CI 3.84/2.22.  CT surgery and AHF consulted.  She was diuresed further w/ IV Lasix. GDMT initiated.  cMRI showed delayed enhancement imaging suggesting minimal viability in the basal to mid inferior and inferoseptal walls. The mid to apical anteroseptal and anterior walls were borderline in terms of expected viability with revascularization, with 50% wall thickness LGE. LVEF 23%, RVEF 24%, mod MR.  cMRI reviewed with Dr. Laneta Simmers. With severe RV dysfunction, borderline viability, and some question of early dementia, decided she would be best served by PCI rather than CABG.  She underwent successful PCI to LAD.   Followed by HF team since then regularly   Referred to EP, saw Dr. Nelly Laurence 02/11/22, planned for ICD  Saw Dr. Nelly Laurence for her 91 day visit 07/04/22, Sherryll Burger was cost prohibative, otherwise on GDMT Device functioning well and well healed   TODAY She is accompanied by her daughter Has ache to her L shoulder, not new, not exertional, worse with moving her arm No CP, palpitations No SOB, mild DOE is at her baseline No near syncope or syncope  Device information Abbot single chamber ICD implanted 03/29/22   Past Medical History:  Diagnosis Date   AV nodal tachycardia (HCC) 2004    with radiofrequency ablation by Dr. Grant Fontana in 2004   CAD (coronary artery disease)    80% stenosis in moderate size diagonal on cath in 2004. with last adenosine Cardiolite study in 2007 showing no abnormalities   Depression    Diabetes mellitus    Dyslipidemia    GERD (gastroesophageal reflux disease)    Hyperlipidemia    Hypertension    Mitral valve prolapse    Osteoarthritis    Peripheral neuropathy    Renal insufficiency     Past Surgical History:  Procedure Laterality Date   CARDIAC CATHETERIZATION  01/21/2002   80% stenosis of moderate sized diagonal branch   CHOLECYSTECTOMY  1978   CORONARY ATHERECTOMY N/A 06/13/2021   Procedure: CORONARY ATHERECTOMY;  Surgeon: Marykay Lex, MD;  Location: [facility] INVASIVE CV LAB;  Service: Cardiovascular;  Laterality: N/A;   CORONARY IMAGING/OCT N/A 06/13/2021   Procedure: INTRAVASCULAR IMAGING/OCT;  Surgeon: Marykay Lex, MD;  Location: [facility] INVASIVE CV LAB;  Service: Cardiovascular;  Laterality: N/A;   CORONARY STENT INTERVENTION N/A 06/13/2021   Procedure: CORONARY STENT INTERVENTION;  Surgeon: Marykay Lex, MD;  Location: [facility] INVASIVE CV LAB;  Service: Cardiovascular;  Laterality: N/A;   ICD IMPLANT N/A 03/29/2022   Procedure: ICD IMPLANT;  Surgeon: Maurice Small, MD;  Location: [facility] INVASIVE CV LAB;  Service: Cardiovascular;  Laterality: N/A;   LEFT BREAST BIOPSY     By Dr. Gabriel Cirri showed fibrocystic changes   LEFT HEART CATH AND CORONARY ANGIOGRAPHY N/A  06/13/2021   Procedure: LEFT HEART CATH AND CORONARY ANGIOGRAPHY;  Surgeon: Marykay Lex, MD;  Location: [facility] INVASIVE CV LAB;  Service: Cardiovascular;  Laterality: N/A;   LEFT HEART CATHETERIZATION WITH CORONARY ANGIOGRAM N/A 12/28/2013   Procedure: LEFT HEART CATHETERIZATION WITH CORONARY ANGIOGRAM;  Surgeon: Corky Crafts, MD;  Location: [facility] CATH LAB;  Service: Cardiovascular;  Laterality: N/A;   RIGHT/LEFT HEART CATH AND CORONARY ANGIOGRAPHY N/A 06/08/2021   Procedure:  RIGHT/LEFT HEART CATH AND CORONARY ANGIOGRAPHY;  Surgeon: Lyn Records, MD;  Location: [facility] INVASIVE CV LAB;  Service: Cardiovascular;  Laterality: N/A;    Current Outpatient Medications  Medication Sig Dispense Refill   ascorbic acid (VITAMIN C) 500 MG tablet Take 1 tablet by mouth daily.     aspirin EC 81 MG tablet Take 1 tablet (81 mg total) by mouth daily. 30 tablet 6   atorvastatin (LIPITOR) 80 MG tablet Take 1 tablet (80 mg total) by mouth at bedtime. 30 tablet 5   Cholecalciferol (VITAMIN D3) 5000 UNITS CAPS Take 2,000 Units by mouth daily.     clopidogrel (PLAVIX) 75 MG tablet Take 1 tablet (75 mg total) by mouth daily. 30 tablet 11   furosemide (LASIX) 40 MG tablet take 1 tablet twice a week on mondays and fridays. 30 tablet 1   metFORMIN (GLUCOPHAGE-XR) 500 MG 24 hr tablet Take 1 tablet (500 mg total) by mouth 2 (two) times daily.     metoprolol succinate (TOPROL-XL) 25 MG 24 hr tablet Take 1/2 tablet (12.5 mg total) by mouth daily. 30 tablet 5   pantoprazole (PROTONIX) 40 MG tablet Take 1 tablet (40 mg total) by mouth 2 (two) times daily. 40 mg 1 tablet twice a day 60 tablet 6   spironolactone (ALDACTONE) 25 MG tablet Take 1 tablet (25 mg total) by mouth daily. 30 tablet 5   No current facility-administered medications for this visit.    Allergies:   Latex   Social History:  The patient  reports that she quit smoking about 35 years ago. Her smoking use included cigarettes. She started smoking about 56 years ago. She has a 5.3 pack-year smoking history. She has never used smokeless tobacco. She reports that she does not drink alcohol and does not use drugs.   Family History:  The patient's family history includes Cancer in her brother and father; Pneumonia in her mother.  ROS:  Please see the history of present illness.    All other systems are reviewed and otherwise negative.   PHYSICAL EXAM:  VS:  BP (!) 100/50   Pulse 84   Ht 5\' 2"  (1.575 m)   Wt 118 lb 9.6 oz (53.8 kg)    SpO2 96%   BMI 21.69 kg/m  BMI: Body mass index is 21.69 kg/m. Well nourished, well developed, in no acute distress HEENT: normocephalic, atraumatic Neck: no JVD, carotid bruits or masses Cardiac:  RRR; no significant murmurs, no rubs, or gallops Lungs:  CTA b/l, no wheezing, rhonchi or rales Abd: soft, nontender MS: no deformity, advanced atrophy/quite thin body habitus atrophy Ext: no edema Skin: warm and dry, no rash Neuro:  No gross deficits appreciated Psych: euthymic mood, full affect  ICD site is stable, no tethering or discomfort   EKG:  not done today  Device interrogation done today and reviewed by myself:  Battery and lead measurements are good She has had some SVTs (by morphology is SVT), appropriately identified by her device No therapies Frequent PACs today noted  11/16/21: TTE 1. Left ventricular ejection fraction, by estimation, is 30 to 35%. The  left ventricle has moderately decreased function. The left ventricle  demonstrates global hypokinesis with basal to mid inferior akinesis. There  is mild concentric left ventricular  hypertrophy. Left ventricular diastolic parameters are consistent with  Grade I diastolic dysfunction (impaired relaxation).   2. Right ventricular systolic function is normal. The right ventricular  size is normal. There is normal pulmonary artery systolic pressure. The  estimated right ventricular systolic pressure is 24.0 mmHg.   3. Left atrial size was mildly dilated.   4. The mitral valve is normal in structure. Mild mitral valve  regurgitation. No evidence of mitral stenosis.   5. The aortic valve is tricuspid. There is mild calcification of the  aortic valve. Aortic valve regurgitation is not visualized. No aortic  stenosis is present.   6. The inferior vena cava is normal in size with greater than 50%  respiratory variability, suggesting right atrial pressure of 3 mmHg.   7. A small pericardial effusion is present. The  pericardial effusion is  localized near the right ventricle.    06/13/21: PCI Prox LAD lesion is 55% stenosed with 95% stenosed side branch in 1st Diag.   -------------------------------------------   Culprit Lesion Segment: Mid LAD-1 lesion is 90% stenosed.  Mid LAD-2 lesion is 95% stenosed.   Orbital atherectomy followed by balloon angioplasty was performed on both lesions   Balloon angioplasty was performed on both lesion sites using a BALLN SAPPHIRE 2.5X15.=> Followed by OCT imaging   A drug-eluting stent was successfully placed covering both lesions (careful not to cover the more proximal 55% lesion-or approach 3rdDiag/distal LAD lesion) using a SYNERGY XD 3.0X28 -> deployed to 3.2 mm   Post intervention, there is a 0% residual stenosis.   -------------------------------------------   Dist LAD lesion is 65% stenosed.   Prox RCA lesion is 100% stenosed -PDA fills via septal collaterals.   LV end diastolic pressure is mildly elevated.   Successful Orbital Atherectomy and OCT guided DES PCI of proximal to mid LAD covering 2 major lesions with a Synergy DES 3.0 mm x 28 mm deployed to 3.2 mm   06/08/21: [facility] CONCLUSIONS: Ischemic cardiomyopathy with acute on chronic systolic heart failure, LVEDP 27 mmHg. CPO = 0.71. Widely patent left main Proximal to mid segmental calcified 95% LAD.  First diagonal of moderate size also 95% obstructed.  LAD and circumflex supply collaterals to the right coronary. Circumflex is widely patent with irregularities but no high-grade obstruction. Totally occluded proximal to mid right coronary.  Fills by left to right collaterals. Mild pulmonary artery hypertension with mean PA pressure 28 mmHg.  Etiology likely WHO group 2 etiology.  Capillary wedge mean pressure 23 mmHg.  Pulmonary vascular resistance 1.3 Wood units Elevated right atrial pressure, mean pressure 14 mmHg. PAPI = 1.6 Cardiac output 3.84 L/min; cardiac index 2.22 L/min/m. CPO = 0.71    Recommendations: Treat right and left heart failure. Consider PCI on LAD with plaque modification by orbital atherectomy or shockwave if anterior wall viability. Advanced heart failure team consult.   Recent Labs: 03/26/2022: BUN 23; Creatinine, Ser 0.99; Hemoglobin 10.6; Platelets 347; Potassium 4.2; Sodium 135  No results found for requested labs within last 365 days.   CrCl cannot be calculated (Patient's most recent lab result is older than the maximum 21 days allowed.).   Wt Readings from Last 3 Encounters:  12/27/22 118 lb 9.6 oz (53.8 kg)  07/04/22 122 lb (  55.3 kg)  03/29/22 120 lb (54.4 kg)     Other studies reviewed: Additional studies/records reviewed today include: summarized above  ASSESSMENT AND PLAN:  ICD Intact function No programming changes made   ICM Chronic CHF (bive failure) No symptoms or exam findings of volume OL  CorVue wobbles Due to see the HF team She is off ARB, likely 2/2 BP I think her BP will tolerate some more BB  CAD No anginal sounding symptoms On ASA, plavix, BB, statin C/w Dr. Wynonia Musty  5. SVT Increase toprol to 25mg  daily  Disposition: F/u with [place] in 2 mo, sooner if needed  Current medicines are reviewed at length with the patient today.  The patient did not have any concerns regarding medicines.  Norma Fredrickson, PA-C 12/27/2022 12:10 PM     [facility] [address] [place] [address] [phone] (office)  [phone] (fax)

## 2022-12-27 ENCOUNTER — Ambulatory Visit: Payer: Medicare Other | Attending: Physician Assistant | Admitting: Physician Assistant

## 2022-12-27 ENCOUNTER — Other Ambulatory Visit (HOSPITAL_COMMUNITY): Payer: Self-pay | Admitting: *Deleted

## 2022-12-27 ENCOUNTER — Encounter: Payer: Self-pay | Admitting: Physician Assistant

## 2022-12-27 VITALS — BP 100/50 | HR 84 | Ht 62.0 in | Wt 118.6 lb

## 2022-12-27 DIAGNOSIS — I5022 Chronic systolic (congestive) heart failure: Secondary | ICD-10-CM | POA: Diagnosis not present

## 2022-12-27 DIAGNOSIS — I255 Ischemic cardiomyopathy: Secondary | ICD-10-CM | POA: Insufficient documentation

## 2022-12-27 DIAGNOSIS — I2510 Atherosclerotic heart disease of native coronary artery without angina pectoris: Secondary | ICD-10-CM | POA: Insufficient documentation

## 2022-12-27 DIAGNOSIS — Z95810 Presence of automatic (implantable) cardiac defibrillator: Secondary | ICD-10-CM | POA: Diagnosis not present

## 2022-12-27 DIAGNOSIS — I471 Supraventricular tachycardia, unspecified: Secondary | ICD-10-CM | POA: Diagnosis not present

## 2022-12-27 LAB — CUP PACEART INCLINIC DEVICE CHECK
Battery Remaining Longevity: 115 mo
Brady Statistic RV Percent Paced: 0 %
Date Time Interrogation Session: 20241220121851
HighPow Impedance: 63 Ohm
Implantable Lead Connection Status: 753985
Implantable Lead Implant Date: 20240322
Implantable Lead Location: 753860
Implantable Lead Model: 7122
Implantable Pulse Generator Implant Date: 20240322
Lead Channel Impedance Value: 462.5 Ohm
Lead Channel Pacing Threshold Amplitude: 0.5 V
Lead Channel Pacing Threshold Amplitude: 0.5 V
Lead Channel Pacing Threshold Pulse Width: 0.5 ms
Lead Channel Pacing Threshold Pulse Width: 0.5 ms
Lead Channel Sensing Intrinsic Amplitude: 12 mV
Lead Channel Setting Pacing Amplitude: 2 V
Lead Channel Setting Pacing Pulse Width: 0.5 ms
Lead Channel Setting Sensing Sensitivity: 0.5 mV
Pulse Gen Serial Number: 211016498

## 2022-12-27 MED ORDER — METOPROLOL SUCCINATE ER 25 MG PO TB24: 25.0000 mg | ORAL_TABLET | Freq: Every day | ORAL | 2 refills | Status: AC

## 2022-12-27 NOTE — Patient Instructions (Signed)
Medication Instructions:    START  TAKING :  METOPROLOL  25 MG  ONCE  DAY   *If you need a refill on your cardiac medications before your next appointment, please call your pharmacy*   Lab Work:     If you have labs (blood work) drawn today and your tests are completely normal, you will receive your results only by: MyChart Message (if you have MyChart) OR A paper copy in the mail If you have any lab test that is abnormal or we need to change your treatment, we will call you to review the results.   Testing/Procedures: NONE ORDERED  TODAY     Follow-Up: At [facility], you and your health needs are our priority.  As part of our continuing mission to provide you with exceptional heart care, we have created designated Provider Care Teams.  These Care Teams include your primary Cardiologist (physician) and Advanced Practice Providers (APPs -  Physician Assistants and Nurse Practitioners) who all work together to provide you with the care you need, when you need it.  We recommend signing up for the patient portal called "MyChart".  Sign up information is provided on this After Visit Summary.  MyChart is used to connect with patients for Virtual Visits (Telemedicine).  Patients are able to view lab/test results, encounter notes, upcoming appointments, etc.  Non-urgent messages can be sent to your provider as well.   To learn more about what you can do with MyChart, go to [email or web address].    Your next appointment:  HEART FAILURE NEXT AVAILABLE                AND    2 month(s) ( CONTACT  CASSIE HALL/ ANGELINE HAMMER FOR EP SCHEDULING ISSUES )   Provider:    You may see the following Advanced Practice Providers on your designated Care Team:   Francis Dowse, [place]    Other Instructions

## 2022-12-30 ENCOUNTER — Ambulatory Visit (INDEPENDENT_AMBULATORY_CARE_PROVIDER_SITE_OTHER): Payer: Medicare Other

## 2022-12-30 DIAGNOSIS — I255 Ischemic cardiomyopathy: Secondary | ICD-10-CM

## 2022-12-30 DIAGNOSIS — I5022 Chronic systolic (congestive) heart failure: Secondary | ICD-10-CM

## 2022-12-30 LAB — CUP PACEART REMOTE DEVICE CHECK
Battery Remaining Longevity: 114 mo
Battery Remaining Percentage: 92 %
Battery Voltage: 3.04 V
Brady Statistic RV Percent Paced: 0 %
Date Time Interrogation Session: 20241223010659
HighPow Impedance: 61 Ohm
Implantable Lead Connection Status: 753985
Implantable Lead Implant Date: 20240322
Implantable Lead Location: 753860
Implantable Lead Model: 7122
Implantable Pulse Generator Implant Date: 20240322
Lead Channel Impedance Value: 440 Ohm
Lead Channel Pacing Threshold Amplitude: 0.5 V
Lead Channel Pacing Threshold Pulse Width: 0.5 ms
Lead Channel Sensing Intrinsic Amplitude: 12 mV
Lead Channel Setting Pacing Amplitude: 2 V
Lead Channel Setting Pacing Pulse Width: 0.5 ms
Lead Channel Setting Sensing Sensitivity: 0.5 mV
Pulse Gen Serial Number: 211016498

## 2023-02-04 NOTE — Progress Notes (Signed)
Remote ICD transmission.

## 2023-02-12 ENCOUNTER — Encounter (HOSPITAL_COMMUNITY): Payer: Self-pay | Admitting: Internal Medicine

## 2023-02-12 ENCOUNTER — Ambulatory Visit (HOSPITAL_BASED_OUTPATIENT_CLINIC_OR_DEPARTMENT_OTHER)
Admission: RE | Admit: 2023-02-12 | Discharge: 2023-02-12 | Disposition: A | Discharge status: Home / Self Care | Payer: Medicare Other | Source: Ambulatory Visit | Attending: Internal Medicine | Admitting: Internal Medicine

## 2023-02-12 ENCOUNTER — Ambulatory Visit (HOSPITAL_COMMUNITY)
Admission: RE | Admit: 2023-02-12 | Discharge: 2023-02-12 | Disposition: A | Discharge status: Home / Self Care | Payer: Medicare Other | Source: Ambulatory Visit | Attending: Cardiology | Admitting: Cardiology

## 2023-02-12 ENCOUNTER — Telehealth (HOSPITAL_COMMUNITY): Payer: Self-pay

## 2023-02-12 ENCOUNTER — Other Ambulatory Visit (HOSPITAL_COMMUNITY): Payer: Self-pay

## 2023-02-12 VITALS — BP 110/70 | HR 65 | Wt 122.0 lb

## 2023-02-12 DIAGNOSIS — I5022 Chronic systolic (congestive) heart failure: Secondary | ICD-10-CM | POA: Diagnosis not present

## 2023-02-12 DIAGNOSIS — I429 Cardiomyopathy, unspecified: Secondary | ICD-10-CM | POA: Diagnosis not present

## 2023-02-12 DIAGNOSIS — F0390 Unspecified dementia without behavioral disturbance: Secondary | ICD-10-CM | POA: Insufficient documentation

## 2023-02-12 DIAGNOSIS — Z006 Encounter for examination for normal comparison and control in clinical research program: Secondary | ICD-10-CM

## 2023-02-12 DIAGNOSIS — E119 Type 2 diabetes mellitus without complications: Secondary | ICD-10-CM

## 2023-02-12 DIAGNOSIS — I340 Nonrheumatic mitral (valve) insufficiency: Secondary | ICD-10-CM | POA: Diagnosis not present

## 2023-02-12 DIAGNOSIS — Z8679 Personal history of other diseases of the circulatory system: Secondary | ICD-10-CM | POA: Insufficient documentation

## 2023-02-12 DIAGNOSIS — Z79899 Other long term (current) drug therapy: Secondary | ICD-10-CM | POA: Insufficient documentation

## 2023-02-12 DIAGNOSIS — Z7902 Long term (current) use of antithrombotics/antiplatelets: Secondary | ICD-10-CM | POA: Diagnosis not present

## 2023-02-12 DIAGNOSIS — I110 Hypertensive heart disease with heart failure: Secondary | ICD-10-CM | POA: Insufficient documentation

## 2023-02-12 DIAGNOSIS — E785 Hyperlipidemia, unspecified: Secondary | ICD-10-CM | POA: Insufficient documentation

## 2023-02-12 DIAGNOSIS — I2510 Atherosclerotic heart disease of native coronary artery without angina pectoris: Secondary | ICD-10-CM | POA: Diagnosis not present

## 2023-02-12 DIAGNOSIS — Z7984 Long term (current) use of oral hypoglycemic drugs: Secondary | ICD-10-CM | POA: Insufficient documentation

## 2023-02-12 LAB — ECHOCARDIOGRAM COMPLETE
AR max vel: 1.74 cm2
AV Area VTI: 1.74 cm2
AV Area mean vel: 1.75 cm2
AV Mean grad: 4 mm[Hg]
AV Peak grad: 6.8 mm[Hg]
Ao pk vel: 1.3 m/s
Area-P 1/2: 3.53 cm2
Calc EF: 47.2 %
MV VTI: 1.7 cm2
S' Lateral: 3.7 cm
Single Plane A2C EF: 49.4 %
Single Plane A4C EF: 46.3 %

## 2023-02-12 MED ORDER — DAPAGLIFLOZIN PROPANEDIOL 10 MG PO TABS: 10.0000 mg | ORAL_TABLET | Freq: Every day | ORAL | 5 refills | Status: AC

## 2023-02-12 NOTE — Telephone Encounter (Signed)
 Advanced Heart Failure Patient Advocate Encounter  Test billing for SGLT2i returned the following results:  Farxiga  $4.90 for 30 or 90 days Jardiance $12.15 for 30 or 90 days  Kennis Peacock, CPhT Rx Patient Advocate Phone: [phone]

## 2023-02-12 NOTE — Patient Instructions (Addendum)
 Medication Changes:  START: FARXIGA  10MG  ONCE DAILY- THIS HAS BEEN SENT TO YOUR PHARMACY    STOP TAKING LASIX  (FUROSEMIDE )   Follow-Up in: 6 MONTHS WITH DR. ROLAN PLEASE CALL OUR OFFICE AROUND JUNE 2025 TO GET SCHEDULED FOR YOUR APPOINTMENT. PHONE NUMBER IS [phone] OPTION 2   At the [facility], you and your health needs are our priority. We have a designated team specialized in the treatment of Heart Failure. This Care Team includes your primary Heart Failure Specialized Cardiologist (physician), Advanced Practice Providers (APPs- Physician Assistants and Nurse Practitioners), and Pharmacist who all work together to provide you with the care you need, when you need it.   You may see any of the following providers on your designated Care Team at your next follow up:  Dr. Toribio Fuel Dr. Ezra Rolan Dr. Ria Commander Dr. Odis Brownie Greig Mosses, NP Caffie Shed, [place] [facility] [place], [place] Beckey Coe, NP Jordan Lee, NP Tinnie Redman, PharmD   Please be sure to bring in all your medications bottles to every appointment.   Need to Contact Us :  If you have any questions or concerns before your next appointment please send us  a message through Cer[facility] or call our office at [phone].    TO LEAVE A MESSAGE FOR THE NURSE SELECT OPTION 2, PLEASE LEAVE A MESSAGE INCLUDING: YOUR NAME DATE OF BIRTH CALL BACK NUMBER REASON FOR CALL**this is important as we prioritize the call backs  YOU WILL RECEIVE A CALL BACK THE SAME DAY AS LONG AS YOU CALL BEFORE 4:00 PM

## 2023-02-12 NOTE — Progress Notes (Addendum)
 Advanced Heart Failure Clinic Note   PCP: [facility] Cardiologist: Dr. Rolan   HPI: Shelby Trevino is a 73 y.o. female with history of AVNRT s/p ablation, CAD (mild to moderate disease in LAD on cath 12/2013), HTN, HLD, DM II. Quite functional at baseline but has some memory issues (daughter said that this is not bad).  Works as a water engineer full time.    Admitted 5/23 with acute systolic CHF. Echo showed EF 20-25%, RWMA in LAD territory, RV moderately reduced, moderate MR. Underwent R/LHC showing totally occluded p to m RCA which fills by collaterals from Cx and LAD, Cx okay, 95% p to m LAD, 95% D1, RA mean 14, PA 40/18 (28), PCWP mean 23, LVEDP 27, Fick CO/CI 3.84/2.22. CT surgery and AHF consulted. She was diuresed further w/ IV Lasix . GDMT initiated. cMRI showed delayed enhancement imaging suggesting minimal viability in the basal to mid inferior and inferoseptal walls. The mid to apical anteroseptal and anterior walls were borderline in terms of expected viability with revascularization, with 50% wall thickness LGE. LVEF 23%, RVEF 24%, mod MR. cMRI reviewed with Dr. Lucas. With severe RV dysfunction, borderline viability, and some question of early dementia, decided she would be best served by PCI rather than CABG. She underwent successful PCI to LAD. Placed on DAPT w/ ASA + Plavix  and high intensity statin. GDMT titrated and discharged to SNF, weight 119 lb.   Echo 11/2021 , EF 30-35%, diffuse hypokinesis with basal-mid inferior akinesis, RV normal, IVC normal.   Underwent Abbott ICD 3/24   Today she returns for f/u with her daughter. Continues to work FT as a water engineer. Takes her time. Denies CP or SOB. No problems with medicines. No dizziness.   Echo today 02/11/22 EF 40-45% inferior AK  Labs (6/23): K 3.8, creatinine 0.87, digoxin  1.2 Labs (7/23): K 4.0, creatinine 0.63 Labs (9/23): K 3.9, creatinine 0.86  ROS: All systems reviewed and negative  except as per HPI.   PMH: 1. AVNRT: s/p ablation in 2004.  2. CAD: Cath in 2004 with 80% stenosis in diagonal.  - cMRI in 6/23 showed delayed enhancement imaging suggesting minimal viability in the basal to mid inferior and inferoseptal walls. The mid to apical anteroseptal and anterior walls were borderline in terms of expected viability with revascularization, with 50% wall thickness LGE. LVEF 23%, RVEF 24%, mod MR. Decision made to proceed with PCI rather than CABG.  - Cath (6/23):  Totally occluded p to m RCA which fills by collaterals from Cx and LAD, Cx okay, 95% p to m LAD, 95% D1.  Patient had atherectomy with DES to proximal/mid LAD.  3. Type 2 diabetes 4. GERD 5. Hyperlipidemia 6. HTN  - Blood pressure well controlled. Continue current regimen. 7. Mild dementia 8. Chronic systolic CHF: Ischemic cardiomyopathy.  - Echo (6/23): EF 20-25%, RWMA in LAD territory, RV moderately reduced, moderate MR - RHC (6/23): RA mean 14, PA 40/18 (28), PCWP mean 23, LVEDP 27, Fick CO/CI 3.84/2.22 - Echo (11/23): EF 30-35%, diffuse hypokinesis with basal-mid inferior akinesis, RV normal, IVC normal.   Current Outpatient Medications  Medication Sig Dispense Refill   ascorbic acid (VITAMIN C) 500 MG tablet Take 1 tablet by mouth daily.     aspirin  EC 81 MG tablet Take 1 tablet (81 mg total) by mouth daily. 30 tablet 6   atorvastatin  (LIPITOR ) 80 MG tablet Take 1 tablet (80 mg total) by mouth at bedtime. 30 tablet 5  Cholecalciferol (VITAMIN D3) 5000 UNITS CAPS Take 2,000 Units by mouth daily.     clopidogrel  (PLAVIX ) 75 MG tablet Take 1 tablet (75 mg total) by mouth daily. 30 tablet 11   furosemide  (LASIX ) 40 MG tablet take 1 tablet twice a week on mondays and fridays. 30 tablet 1   metFORMIN  (GLUCOPHAGE -XR) 500 MG 24 hr tablet Take 1 tablet (500 mg total) by mouth 2 (two) times daily.     metoprolol  succinate (TOPROL -XL) 25 MG 24 hr tablet Take 1 tablet (25 mg total) by mouth daily. 90 tablet 2    Multiple Vitamins-Minerals (CENTRUM ADULT PO) Take 1 tablet by mouth daily.     pantoprazole  (PROTONIX ) 40 MG tablet Take 1 tablet (40 mg total) by mouth 2 (two) times daily. 40 mg 1 tablet twice a day 60 tablet 6   spironolactone  (ALDACTONE ) 25 MG tablet Take 1 tablet (25 mg total) by mouth daily. 30 tablet 5   No current facility-administered medications for this encounter.   Allergies  Allergen Reactions   Latex     Itching and rash   Social History   Socioeconomic History   Marital status: Divorced    Spouse name: Not on file   Number of children: Not on file   Years of education: Not on file   Highest education level: Not on file  Occupational History   Not on file  Tobacco Use   Smoking status: Former    Current packs/day: 0.00    Average packs/day: 0.3 packs/day for 21.1 years (5.3 ttl pk-yrs)    Types: Cigarettes    Start date: 06/22/1966    Quit date: 07/23/1987    Years since quitting: 35.5   Smokeless tobacco: Never  Substance and Sexual Activity   Alcohol  use: No    Alcohol /week: 0.0 standard drinks of alcohol    Drug use: No   Sexual activity: Not on file  Other Topics Concern   Not on file  Social History Narrative   Has one son and one daughter.   Has five grandchildren.   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 3 brothers died with cancer.   Social Drivers of Health   Financial Resource Strain: Medium Risk (06/28/2021)   Overall Financial Resource Strain (CARDIA)    Difficulty of Paying Living Expenses: Somewhat hard  Food Insecurity: Not on file  Transportation Needs: Not on file  Physical Activity: Not on file  Stress: Not on file  Social Connections: Unknown (05/21/2021)   [facility]   Social Network    Social Network: Not on file  Intimate Partner Violence: Not At Risk (09/06/2021)   Received from [facility], [facility]   Humiliation, Afraid, Rape, and Kick questionnaire     Fear of Current or Ex-Partner: No    Emotionally Abused: No    Physically Abused: No    Sexually Abused: No   Family History  Problem Relation Age of Onset   Cancer Father    Pneumonia Mother    Cancer Brother        3 brothers died from cancer   Wt Readings from Last 3 Encounters:  02/12/23 55.3 kg (122 lb)  12/27/22 53.8 kg (118 lb 9.6 oz)  07/04/22 55.3 kg (122 lb)   BP 110/70   Pulse 65   Wt 55.3 kg (122 lb)   SpO2 100%   BMI 22.31 kg/m   PHYSICAL EXAM: General:  Elderly. Thin. Weak appearing. No resp difficulty HEENT: normal + poor dentition  Neck: supple. no JVD. Carotids 2+ bilat; no bruits. No lymphadenopathy or thryomegaly appreciated. Cor: Regular rate & rhythm. No rubs, gallops or murmurs. Lungs: decreased throughout Abdomen: soft, nontender, nondistended. No hepatosplenomegaly. No bruits or masses. Good bowel sounds. Extremities: no cyanosis, clubbing, rash, edema Neuro: alert & orientedx3, cranial nerves grossly intact. moves all 4 extremities w/o difficulty. Affect pleasant   ASSESSMENT & PLAN: 1. Chronic systolic CHF: Ischemic cardiomyopathy.  Echo 6/23 showed EF 20-25% with moderate RV dysfunction and moderate MR, dilated IVC. RHC in 6/23 showed elevated filling pressures with CI 2.22. cMRI in 6/23 with LVEF 23%, RVEF 24%, delayed enhancement imaging suggested minimal viability in the basal to mid inferior and inferoseptal walls; the mid to apical anteroseptal and anterior walls were borderline in terms of expected viability with revascularization. Echo 11/2021 EF 30-35%, diffuse hypokinesis with basal-mid inferior akinesis, RV normal, IVC normal.   - stable NYHA II-III. Volume status looks good   - Continue spironolactone  25 mg daily.  - Continue Toprol  XL 12.5 mg daily.  - Failed low-dose losartan  12.5 due to low BP  - Start Farxiga  10 can stop lasix   Discussed risk of UTIs/yeast infections 2. CAD: Cath in 6/23 showed severe proximal to mid LAD and proximal  D1 disease as well as occluded RCA with collaterals.  Given severe RV dysfunction, borderline viability based on cMRI, and some question of early dementia, it was decided that she would be best served by PCI rather than CABG. She had atherectomy + DES to LAD. No chest pain - Continue ASA 81 and Plavix  75.   - Continue atorvastatin  - lipids followed by PCP 3. Type 2 diabetes:  Per PCP.  4. AVNRT: H/o AVNRT ablation.  Denies palpitations  5. Dementia: Mild but present   F/u with Dr. Rolan Toribio Fuel, MD 02/12/23

## 2023-02-12 NOTE — Research (Signed)
 SITE: [id number]     Subject # [id number]    Subprotocol: A  Inclusion Criteria  Patients who meet all of the following criteria are eligible for enrollment as study participants:  Yes No  Age > 75 years old X   Eligible to wear Holter Study X    Exclusion Criteria  Patients who meet any of these criteria are not eligible for enrollment as study participants: Yes No  1. Receiving any mechanical (respiratory or circulatory) or renal support therapy at Screening or during Visit #1.  X  2.  Any other conditions that in the opinion of the investigators are likely to prevent compliance with the study protocol or pose a safety concern if the subject participates in the study.  X  3. Poor tolerance, namely susceptible to severe skin allergies from ECG adhesive patch application.  X   Protocol: REV H                                     Residential Zip [address] (First 3 digits ONLY)                                             [facility] Informed Consent   Subject Name: Shelby Trevino  Subject met inclusion and exclusion criteria.  The informed consent form, study requirements and expectations were reviewed with the subject. Subject had opportunity to read consent and questions and concerns were addressed prior to the signing of the consent form.  The subject verbalized understanding of the trial requirements.  The subject agreed to participate in the [facility] EF ACT trial and signed the informed consent at 14:25 on 12-Feb-2023.  The informed consent was obtained prior to performance of any protocol-specific procedures for the subject.  A copy of the signed informed consent was given to the subject and a copy was placed in the subject's medical record.   Shelby Trevino          Current Outpatient Medications:    ascorbic acid (VITAMIN C) 500 MG tablet, Take 1 tablet by mouth daily., Disp: , Rfl:    aspirin  EC 81 MG tablet, Take 1 tablet (81 mg total) by mouth daily., Disp: 30 tablet, Rfl: 6    atorvastatin  (LIPITOR ) 80 MG tablet, Take 1 tablet (80 mg total) by mouth at bedtime., Disp: 30 tablet, Rfl: 5   Cholecalciferol (VITAMIN D3) 5000 UNITS CAPS, Take 2,000 Units by mouth daily., Disp: , Rfl:    clopidogrel  (PLAVIX ) 75 MG tablet, Take 1 tablet (75 mg total) by mouth daily., Disp: 30 tablet, Rfl: 11   dapagliflozin  propanediol (FARXIGA ) 10 MG TABS tablet, Take 1 tablet (10 mg total) by mouth daily before breakfast., Disp: 30 tablet, Rfl: 5   metFORMIN  (GLUCOPHAGE -XR) 500 MG 24 hr tablet, Take 1 tablet (500 mg total) by mouth 2 (two) times daily., Disp: , Rfl:    metoprolol  succinate (TOPROL -XL) 25 MG 24 hr tablet, Take 1 tablet (25 mg total) by mouth daily., Disp: 90 tablet, Rfl: 2   Multiple Vitamins-Minerals (CENTRUM ADULT PO), Take 1 tablet by mouth daily., Disp: , Rfl:    pantoprazole  (PROTONIX ) 40 MG tablet, Take 1 tablet (40 mg total) by mouth 2 (two) times daily. 40 mg 1 tablet twice a day, Disp: 60 tablet,  Rfl: 6   spironolactone  (ALDACTONE ) 25 MG tablet, Take 1 tablet (25 mg total) by mouth daily., Disp: 30 tablet, Rfl: 5

## 2023-02-13 ENCOUNTER — Other Ambulatory Visit (HOSPITAL_COMMUNITY): Payer: Self-pay

## 2023-02-13 ENCOUNTER — Telehealth (HOSPITAL_COMMUNITY): Payer: Self-pay | Admitting: Pharmacy Technician

## 2023-02-13 NOTE — Telephone Encounter (Signed)
 Advanced Heart Failure Patient Advocate Encounter  Received a message that patient needed help with Farxiga . Called and spoke with the patients daughter. They do not have a copy of her moms part D information, so pharmacy was charging full price for RX (with insurance 90 day co-pay is $4.80). Sent copy of part D information to her email, so that they can have a copy to provide to the pharmacy.  Advised her to call back if there are any further issues.  Almarie JULIANNA Pa, CPhT

## 2023-02-26 ENCOUNTER — Other Ambulatory Visit: Payer: Self-pay | Admitting: Physician Assistant

## 2023-02-26 DIAGNOSIS — N182 Chronic kidney disease, stage 2 (mild): Secondary | ICD-10-CM | POA: Diagnosis not present

## 2023-02-26 DIAGNOSIS — E119 Type 2 diabetes mellitus without complications: Secondary | ICD-10-CM | POA: Diagnosis not present

## 2023-02-26 DIAGNOSIS — E782 Mixed hyperlipidemia: Secondary | ICD-10-CM | POA: Diagnosis not present

## 2023-02-26 DIAGNOSIS — E7849 Other hyperlipidemia: Secondary | ICD-10-CM | POA: Diagnosis not present

## 2023-02-27 NOTE — Telephone Encounter (Signed)
 This is a CHF pt

## 2023-03-03 NOTE — Progress Notes (Unsigned)
 Cardiology Office Note Date:  03/03/2023  Patient ID:  Shelby Trevino, Shelby Trevino 1950-05-24, MRN [id number] PCP:  [facility]  Cardiologist:  Dr. Shirlee Latch Electrophysiologist: Dr. Nelly Laurence     Chief Complaint:  *** 2 mo  History of Present Illness: Shelby Trevino is a 73 y.o. female with history of SVT (AVNRT ablated 2004), HTN, HLD, DM, CAD, ICM (bive failure), dementia   Admitted 05/2021 with acute systolic CHF.  Echo showed EF 20-25%, RWMA in LAD territory, RV moderately reduced, moderate MR.  Underwent R/LHC showing totally occluded p to m RCA which fills by collaterals from Cx and LAD, Cx okay, 95% p to m LAD, 95% D1, RA mean 14, PA 40/18 (28), PCWP mean 23, LVEDP 27, Fick CO/CI 3.84/2.22.  CT surgery and AHF consulted.  She was diuresed further w/ IV Lasix. GDMT initiated.  cMRI showed delayed enhancement imaging suggesting minimal viability in the basal to mid inferior and inferoseptal walls. The mid to apical anteroseptal and anterior walls were borderline in terms of expected viability with revascularization, with 50% wall thickness LGE. LVEF 23%, RVEF 24%, mod MR.  cMRI reviewed with Dr. Laneta Simmers. With severe RV dysfunction, borderline viability, and some question of early dementia, decided she would be best served by PCI rather than CABG.  She underwent successful PCI to LAD.   Followed by HF team since then regularly   Referred to EP, saw Dr. Nelly Laurence 02/11/22, planned for ICD  Saw Dr. Nelly Laurence for her 91 day visit 07/04/22, Sherryll Burger was cost prohibative, otherwise on GDMT Device functioning well and well healed   I saw her 12/27/22 She is accompanied by her daughter Has ache to her L shoulder, not new, not exertional, worse with moving her arm No CP, palpitations No SOB, mild DOE is at her baseline No near syncope or syncope SVTs on her device check and her Toprol dose increased   Saw Dr. Gala Romney 02/12/23, working full time as a home health aid.  Echo done at  this visit read as  EF 40-45% inferior AK started Farxiga Unable too tolerate ACE/ARB 2/2 BP   *** SVT? *** arrhythmias *** volume   Device information Abbot single chamber ICD implanted 03/29/22   Past Medical History:  Diagnosis Date   AV nodal tachycardia (HCC) 2004   with radiofrequency ablation by Dr. Grant Fontana in 2004   CAD (coronary artery disease)    80% stenosis in moderate size diagonal on cath in 2004. with last adenosine Cardiolite study in 2007 showing no abnormalities   Depression    Diabetes mellitus    Dyslipidemia    GERD (gastroesophageal reflux disease)    Hyperlipidemia    Hypertension    Mitral valve prolapse    Osteoarthritis    Peripheral neuropathy    Renal insufficiency     Past Surgical History:  Procedure Laterality Date   CARDIAC CATHETERIZATION  01/21/2002   80% stenosis of moderate sized diagonal branch   CHOLECYSTECTOMY  1978   CORONARY ATHERECTOMY N/A 06/13/2021   Procedure: CORONARY ATHERECTOMY;  Surgeon: Marykay Lex, MD;  Location[facility] INVASIVE CV LAB;  Service: Cardiovascular;  Laterality: N/A;   CORONARY IMAGING/OCT N/A 06/13/2021   Procedure: INTRAVASCULAR IMAGING/OCT;  Surgeon: Marykay Lex, MD;  Location: [facility] INVASIVE CV LAB;  Service: Cardiovascular;  Laterality: N/A;   CORONARY STENT INTERVENTION N/A 06/13/2021   Procedure: CORONARY STENT INTERVENTION;  Surgeon: Marykay Lex, MD;  Location: [facility] INVASIVE CV LAB;  Service:  Cardiovascular;  Laterality: N/A;   ICD IMPLANT N/A 03/29/2022   Procedure: ICD IMPLANT;  Surgeon: Maurice Small, MD;  Location: [facility] INVASIVE CV LAB;  Service: Cardiovascular;  Laterality: N/A;   LEFT BREAST BIOPSY     By Dr. Gabriel Cirri showed fibrocystic changes   LEFT HEART CATH AND CORONARY ANGIOGRAPHY N/A 06/13/2021   Procedure: LEFT HEART CATH AND CORONARY ANGIOGRAPHY;  Surgeon: Marykay Lex, MD;  Location: [facility] INVASIVE CV LAB;  Service: Cardiovascular;  Laterality: N/A;   LEFT HEART CATHETERIZATION WITH  CORONARY ANGIOGRAM N/A 12/28/2013   Procedure: LEFT HEART CATHETERIZATION WITH CORONARY ANGIOGRAM;  Surgeon: Corky Crafts, MD;  Location: [facility] CATH LAB;  Service: Cardiovascular;  Laterality: N/A;   RIGHT/LEFT HEART CATH AND CORONARY ANGIOGRAPHY N/A 06/08/2021   Procedure: RIGHT/LEFT HEART CATH AND CORONARY ANGIOGRAPHY;  Surgeon: Lyn Records, MD;  Location: [facility] INVASIVE CV LAB;  Service: Cardiovascular;  Laterality: N/A;    Current Outpatient Medications  Medication Sig Dispense Refill   ascorbic acid (VITAMIN C) 500 MG tablet Take 1 tablet by mouth daily.     aspirin EC 81 MG tablet Take 1 tablet (81 mg total) by mouth daily. 30 tablet 6   atorvastatin (LIPITOR) 80 MG tablet take 1 tablet(s) oral at bed time . 30 tablet 0   Cholecalciferol (VITAMIN D3) 5000 UNITS CAPS Take 2,000 Units by mouth daily.     clopidogrel (PLAVIX) 75 MG tablet Take 1 tablet (75 mg total) by mouth daily. 30 tablet 11   dapagliflozin propanediol (FARXIGA) 10 MG TABS tablet Take 1 tablet (10 mg total) by mouth daily before breakfast. 30 tablet 5   metFORMIN (GLUCOPHAGE-XR) 500 MG 24 hr tablet Take 1 tablet (500 mg total) by mouth 2 (two) times daily.     metoprolol succinate (TOPROL-XL) 25 MG 24 hr tablet Take 1 tablet (25 mg total) by mouth daily. 90 tablet 2   Multiple Vitamins-Minerals (CENTRUM ADULT PO) Take 1 tablet by mouth daily.     pantoprazole (PROTONIX) 40 MG tablet Take 1 tablet (40 mg total) by mouth 2 (two) times daily. 40 mg 1 tablet twice a day 60 tablet 6   spironolactone (ALDACTONE) 25 MG tablet Take 1 tablet (25 mg total) by mouth daily. 30 tablet 5   No current facility-administered medications for this visit.    Allergies:   Latex   Social History:  The patient  reports that she quit smoking about 35 years ago. Her smoking use included cigarettes. She started smoking about 56 years ago. She has a 5.3 pack-year smoking history. She has never used smokeless tobacco. She reports that she does  not drink alcohol and does not use drugs.   Family History:  The patient's family history includes Cancer in her brother and father; Pneumonia in her mother.  ROS:  Please see the history of present illness.    All other systems are reviewed and otherwise negative.   PHYSICAL EXAM:  VS:  There were no vitals taken for this visit. BMI: There is no height or weight on file to calculate BMI. Well nourished, well developed, in no acute distress HEENT: normocephalic, atraumatic Neck: no JVD, carotid bruits or masses Cardiac: *** RRR; no significant murmurs, no rubs, or gallops Lungs: *** CTA b/l, no wheezing, rhonchi or rales Abd: soft, nontender MS: no deformity, advanced atrophy/quite thin body habitus atrophy Ext: *** no edema Skin: warm and dry, no rash Neuro:  No gross deficits appreciated Psych: euthymic mood, full affect  ***  ICD site is stable, no tethering or discomfort   EKG:  not done today  Device interrogation done today and reviewed by myself:  *** Battery and lead measurements are good ***   11/16/21: TTE 1. Left ventricular ejection fraction, by estimation, is 30 to 35%. The  left ventricle has moderately decreased function. The left ventricle  demonstrates global hypokinesis with basal to mid inferior akinesis. There  is mild concentric left ventricular  hypertrophy. Left ventricular diastolic parameters are consistent with  Grade I diastolic dysfunction (impaired relaxation).   2. Right ventricular systolic function is normal. The right ventricular  size is normal. There is normal pulmonary artery systolic pressure. The  estimated right ventricular systolic pressure is 24.0 mmHg.   3. Left atrial size was mildly dilated.   4. The mitral valve is normal in structure. Mild mitral valve  regurgitation. No evidence of mitral stenosis.   5. The aortic valve is tricuspid. There is mild calcification of the  aortic valve. Aortic valve regurgitation is not  visualized. No aortic  stenosis is present.   6. The inferior vena cava is normal in size with greater than 50%  respiratory variability, suggesting right atrial pressure of 3 mmHg.   7. A small pericardial effusion is present. The pericardial effusion is  localized near the right ventricle.    06/13/21: PCI Prox LAD lesion is 55% stenosed with 95% stenosed side branch in 1st Diag.   -------------------------------------------   Culprit Lesion Segment: Mid LAD-1 lesion is 90% stenosed.  Mid LAD-2 lesion is 95% stenosed.   Orbital atherectomy followed by balloon angioplasty was performed on both lesions   Balloon angioplasty was performed on both lesion sites using a BALLN SAPPHIRE 2.5X15.=> Followed by OCT imaging   A drug-eluting stent was successfully placed covering both lesions (careful not to cover the more proximal 55% lesion-or approach 3rdDiag/distal LAD lesion) using a SYNERGY XD 3.0X28 -> deployed to 3.2 mm   Post intervention, there is a 0% residual stenosis.   -------------------------------------------   Dist LAD lesion is 65% stenosed.   Prox RCA lesion is 100% stenosed -PDA fills via septal collaterals.   LV end diastolic pressure is mildly elevated.   Successful Orbital Atherectomy and OCT guided DES PCI of proximal to mid LAD covering 2 major lesions with a Synergy DES 3.0 mm x 28 mm deployed to 3.2 mm   06/08/21: [facility] CONCLUSIONS: Ischemic cardiomyopathy with acute on chronic systolic heart failure, LVEDP 27 mmHg. CPO = 0.71. Widely patent left main Proximal to mid segmental calcified 95% LAD.  First diagonal of moderate size also 95% obstructed.  LAD and circumflex supply collaterals to the right coronary. Circumflex is widely patent with irregularities but no high-grade obstruction. Totally occluded proximal to mid right coronary.  Fills by left to right collaterals. Mild pulmonary artery hypertension with mean PA pressure 28 mmHg.  Etiology likely WHO group 2 etiology.   Capillary wedge mean pressure 23 mmHg.  Pulmonary vascular resistance 1.3 Wood units Elevated right atrial pressure, mean pressure 14 mmHg. PAPI = 1.6 Cardiac output 3.84 L/min; cardiac index 2.22 L/min/m. CPO = 0.71   Recommendations: Treat right and left heart failure. Consider PCI on LAD with plaque modification by orbital atherectomy or shockwave if anterior wall viability. Advanced heart failure team consult.   Recent Labs: 03/26/2022: BUN 23; Creatinine, Ser 0.99; Hemoglobin 10.6; Platelets 347; Potassium 4.2; Sodium 135  No results found for requested labs within last 365 days.   CrCl cannot  be calculated (Patient's most recent lab result is older than the maximum 21 days allowed.).   Wt Readings from Last 3 Encounters:  02/12/23 122 lb (55.3 kg)  12/27/22 118 lb 9.6 oz (53.8 kg)  07/04/22 122 lb (55.3 kg)     Other studies reviewed: Additional studies/records reviewed today include: summarized above  ASSESSMENT AND PLAN:  ICD *** Intact function *** No programming changes made   ICM Chronic CHF (bive failure) No symptoms or exam findings of volume OL  *** CorVue wobbles *** She is off ARB, likely 2/2 BP   CAD *** No anginal sounding symptoms *** On ASA, plavix, BB, statin C/w Dr. Wynonia Musty  5. SVT ***  Disposition:***   Current medicines are reviewed at length with the patient today.  The patient did not have any concerns regarding medicines.  Norma Fredrickson, PA-C 03/03/2023 11:34 AM     [facility] [address] [place] [address] [phone] (office)  [phone] (fax)

## 2023-03-06 ENCOUNTER — Encounter: Payer: Self-pay | Admitting: Physician Assistant

## 2023-03-06 ENCOUNTER — Ambulatory Visit: Payer: Medicare Other | Attending: Physician Assistant | Admitting: Physician Assistant

## 2023-03-06 VITALS — BP 110/56 | HR 68 | Ht 62.0 in | Wt 120.0 lb

## 2023-03-06 DIAGNOSIS — I471 Supraventricular tachycardia, unspecified: Secondary | ICD-10-CM | POA: Insufficient documentation

## 2023-03-06 DIAGNOSIS — I255 Ischemic cardiomyopathy: Secondary | ICD-10-CM | POA: Insufficient documentation

## 2023-03-06 DIAGNOSIS — I5022 Chronic systolic (congestive) heart failure: Secondary | ICD-10-CM | POA: Diagnosis not present

## 2023-03-06 DIAGNOSIS — I2510 Atherosclerotic heart disease of native coronary artery without angina pectoris: Secondary | ICD-10-CM | POA: Insufficient documentation

## 2023-03-06 DIAGNOSIS — Z95810 Presence of automatic (implantable) cardiac defibrillator: Secondary | ICD-10-CM | POA: Diagnosis not present

## 2023-03-06 LAB — CUP PACEART INCLINIC DEVICE CHECK
Battery Remaining Longevity: 114 mo
Brady Statistic RV Percent Paced: 0 %
Date Time Interrogation Session: 20250227115620
HighPow Impedance: 55.125
Implantable Lead Connection Status: 753985
Implantable Lead Implant Date: 20240322
Implantable Lead Location: 753860
Implantable Lead Model: 7122
Implantable Pulse Generator Implant Date: 20240322
Lead Channel Impedance Value: 425 Ohm
Lead Channel Pacing Threshold Amplitude: 0.5 V
Lead Channel Pacing Threshold Amplitude: 0.5 V
Lead Channel Pacing Threshold Pulse Width: 0.5 ms
Lead Channel Pacing Threshold Pulse Width: 0.5 ms
Lead Channel Sensing Intrinsic Amplitude: 12 mV
Lead Channel Setting Pacing Amplitude: 2 V
Lead Channel Setting Pacing Pulse Width: 0.5 ms
Lead Channel Setting Sensing Sensitivity: 0.5 mV
Pulse Gen Serial Number: 211016498

## 2023-03-06 NOTE — Patient Instructions (Addendum)
 Medication Instructions:  No Changes *If you need a refill on your cardiac medications before your next appointment, please call your pharmacy*   Lab Work: No Labs If you have labs (blood work) drawn today and your tests are completely normal, you will receive your results only by: MyChart Message (if you have MyChart) OR A paper copy in the mail If you have any lab test that is abnormal or we need to change your treatment, we will call you to review the results.   Testing/Procedures: No Testing   Follow-Up: At [facility], you and your health needs are our priority.  As part of our continuing mission to provide you with exceptional heart care, we have created designated Provider Care Teams.  These Care Teams include your primary Cardiologist (physician) and Advanced Practice Providers (APPs -  Physician Assistants and Nurse Practitioners) who all work together to provide you with the care you need, when you need it.  We recommend signing up for the patient portal called "MyChart".  Sign up information is provided on this After Visit Summary.  MyChart is used to connect with patients for Virtual Visits (Telemedicine).  Patients are able to view lab/test results, encounter notes, upcoming appointments, etc.  Non-urgent messages can be sent to your provider as well.   To learn more about what you can do with MyChart, go to [email or web address].    Your next appointment:   6 month(s)  Provider:   York Pellant, MD, or Francis Dowse, PA-C

## 2023-03-24 NOTE — Addendum Note (Signed)
 Encounter addended by: Howell Rucks, RDCS on: 03/24/2023 7:12 AM  Actions taken: Imaging Exam ended

## 2023-03-31 ENCOUNTER — Ambulatory Visit: Payer: Medicare Other

## 2023-03-31 DIAGNOSIS — I255 Ischemic cardiomyopathy: Secondary | ICD-10-CM | POA: Diagnosis not present

## 2023-03-31 DIAGNOSIS — I5022 Chronic systolic (congestive) heart failure: Secondary | ICD-10-CM

## 2023-04-01 LAB — CUP PACEART REMOTE DEVICE CHECK
Battery Remaining Longevity: 112 mo
Battery Remaining Percentage: 90 %
Battery Voltage: 3.02 V
Brady Statistic RV Percent Paced: 0 %
Date Time Interrogation Session: 20250324030256
HighPow Impedance: 61 Ohm
Implantable Lead Connection Status: 753985
Implantable Lead Implant Date: 20240322
Implantable Lead Location: 753860
Implantable Lead Model: 7122
Implantable Pulse Generator Implant Date: 20240322
Lead Channel Impedance Value: 460 Ohm
Lead Channel Pacing Threshold Amplitude: 0.5 V
Lead Channel Pacing Threshold Pulse Width: 0.5 ms
Lead Channel Sensing Intrinsic Amplitude: 12 mV
Lead Channel Setting Pacing Amplitude: 2 V
Lead Channel Setting Pacing Pulse Width: 0.5 ms
Lead Channel Setting Sensing Sensitivity: 0.5 mV
Pulse Gen Serial Number: 211016498

## 2023-05-15 NOTE — Addendum Note (Signed)
 Addended by: Edra Govern D on: 05/15/2023 02:26 PM   Modules accepted: Orders

## 2023-05-15 NOTE — Progress Notes (Signed)
 Remote ICD transmission.

## 2023-06-30 ENCOUNTER — Ambulatory Visit: Payer: Medicare Other

## 2023-06-30 DIAGNOSIS — I255 Ischemic cardiomyopathy: Secondary | ICD-10-CM

## 2023-06-30 DIAGNOSIS — I5022 Chronic systolic (congestive) heart failure: Secondary | ICD-10-CM

## 2023-07-01 LAB — CUP PACEART REMOTE DEVICE CHECK
Battery Remaining Longevity: 110 mo
Battery Remaining Percentage: 89 %
Battery Voltage: 3.02 V
Brady Statistic RV Percent Paced: 1 %
Date Time Interrogation Session: 20250623020029
HighPow Impedance: 64 Ohm
Implantable Lead Connection Status: 753985
Implantable Lead Implant Date: 20240322
Implantable Lead Location: 753860
Implantable Lead Model: 7122
Implantable Pulse Generator Implant Date: 20240322
Lead Channel Impedance Value: 500 Ohm
Lead Channel Pacing Threshold Amplitude: 0.5 V
Lead Channel Pacing Threshold Pulse Width: 0.5 ms
Lead Channel Sensing Intrinsic Amplitude: 12 mV
Lead Channel Setting Pacing Amplitude: 2 V
Lead Channel Setting Pacing Pulse Width: 0.5 ms
Lead Channel Setting Sensing Sensitivity: 0.5 mV
Pulse Gen Serial Number: 211016498

## 2023-07-03 ENCOUNTER — Ambulatory Visit: Payer: Self-pay | Admitting: Cardiovascular Disease

## 2023-08-01 NOTE — Addendum Note (Signed)
 Addended by: TAWNI DRILLING D on: 08/01/2023 04:34 PM   Modules accepted: Orders

## 2023-08-01 NOTE — Progress Notes (Signed)
 Remote ICD transmission.

## 2023-09-29 ENCOUNTER — Ambulatory Visit (INDEPENDENT_AMBULATORY_CARE_PROVIDER_SITE_OTHER): Payer: Medicare Other

## 2023-09-29 DIAGNOSIS — I255 Ischemic cardiomyopathy: Secondary | ICD-10-CM

## 2023-09-29 LAB — CUP PACEART REMOTE DEVICE CHECK
Battery Remaining Longevity: 107 mo
Battery Remaining Percentage: 87 %
Battery Voltage: 3.02 V
Brady Statistic RV Percent Paced: 1 %
Date Time Interrogation Session: 20250922020132
HighPow Impedance: 65 Ohm
Implantable Lead Connection Status: 753985
Implantable Lead Implant Date: 20240322
Implantable Lead Location: 753860
Implantable Lead Model: 7122
Implantable Pulse Generator Implant Date: 20240322
Lead Channel Impedance Value: 460 Ohm
Lead Channel Pacing Threshold Amplitude: 0.5 V
Lead Channel Pacing Threshold Pulse Width: 0.5 ms
Lead Channel Sensing Intrinsic Amplitude: 12 mV
Lead Channel Setting Pacing Amplitude: 2 V
Lead Channel Setting Pacing Pulse Width: 0.5 ms
Lead Channel Setting Sensing Sensitivity: 0.5 mV
Pulse Gen Serial Number: 211016498

## 2023-09-30 NOTE — Progress Notes (Signed)
Remote ICD Transmission.

## 2023-10-01 ENCOUNTER — Ambulatory Visit: Payer: Self-pay | Admitting: Cardiovascular Disease

## 2023-10-15 NOTE — Progress Notes (Unsigned)
  Electrophysiology Office Note:    Date:  10/16/2023   ID:  MORAIMA BURD, DOB 12-25-1950, MRN [id number]  PCP:  [facility]   [facility] Providers Cardiologist:  None Electrophysiologist:  Eulas FORBES Furbish, MD     Referring MD: [facility]*   History of Present Illness:    SHANAN FITZPATRICK is a 73 y.o. female with a hx listed below, significant for AVNRT s/p ablation 2004, CAD with ischemic cardiomyopathy, CHFrEF here for device follow-up.  He was diagnosed with heart failure in May 2023.  Cath showed occlusive coronary disease in multiple territories.  Based upon the patient's risk factors and the question of early dementia, PCI of the LAD was performed rather than bypass surgery.  In follow-up, 6 months later, EF remained low at 30 to 35%. She has not been able to take entresto  due to cost. Otherwise, she has been taking GDMT.  She underwent placement of an Abbott single-chamber ICD in March 2024. she has no device related complaints -- no new tenderness, drainage, redness.      EKGs/Labs/Other Studies Reviewed Today:    TTEs: 12/01/2021 and 06/2021 CMR 06/2021  EKG:  Last EKG results: today - sinus rhythm   Recent Labs: No results found for requested labs within last 365 days.     Physical Exam:    VS:  BP (!) 168/86 (BP Location: Left Arm)   Pulse 71   Ht 5' 2 (1.575 m)   Wt 143 lb 9.6 oz (65.1 kg)   SpO2 96%   BMI 26.26 kg/m     Wt Readings from Last 3 Encounters:  10/16/23 143 lb 9.6 oz (65.1 kg)  03/06/23 120 lb (54.4 kg)  02/12/23 122 lb (55.3 kg)     GEN: Thin, elderly-appearing, in no acute distress CARDIAC: RRR, no murmurs, rubs, gallops The device site is normal -- no tenderness, edema, drainage, redness, threatened erosion.  RESPIRATORY:  Normal work of breathing MUSCULOSKELETAL: no edema    ASSESSMENT & PLAN:    Ischemic cardiomyopathy:  Abbott ICD in place, functioning normally I reviewed the device  interrogation today in detail.  See Paceart  CHFrEF:  EF has improved to 35-40% on GDMT. Continue losartan  25, Toprol  25, spironolactone  25        Medication Adjustments/Labs and Tests Ordered: Current medicines are reviewed at length with the patient today.  Concerns regarding medicines are outlined above.  Orders Placed This Encounter  Procedures   EKG 12-Lead   No orders of the defined types were placed in this encounter.    Signed, Eulas FORBES Furbish, MD  10/16/2023 9:58 AM    [facility]

## 2023-10-16 ENCOUNTER — Ambulatory Visit: Attending: Cardiovascular Disease | Admitting: Cardiovascular Disease

## 2023-10-16 ENCOUNTER — Encounter: Payer: Self-pay | Admitting: Cardiovascular Disease

## 2023-10-16 ENCOUNTER — Encounter: Payer: Self-pay | Admitting: *Deleted

## 2023-10-16 VITALS — BP 168/86 | HR 71 | Ht 62.0 in | Wt 143.6 lb

## 2023-10-16 DIAGNOSIS — I471 Supraventricular tachycardia, unspecified: Secondary | ICD-10-CM | POA: Insufficient documentation

## 2023-10-16 LAB — CUP PACEART INCLINIC DEVICE CHECK
Battery Remaining Longevity: 108 mo
Brady Statistic RV Percent Paced: 0 %
Date Time Interrogation Session: 20251009101042
HighPow Impedance: 61.875
Implantable Lead Connection Status: 753985
Implantable Lead Implant Date: 20240322
Implantable Lead Location: 753860
Implantable Lead Model: 7122
Implantable Pulse Generator Implant Date: 20240322
Lead Channel Impedance Value: 475 Ohm
Lead Channel Pacing Threshold Amplitude: 0.5 V
Lead Channel Pacing Threshold Amplitude: 0.5 V
Lead Channel Pacing Threshold Pulse Width: 0.5 ms
Lead Channel Pacing Threshold Pulse Width: 0.5 ms
Lead Channel Sensing Intrinsic Amplitude: 12 mV
Lead Channel Setting Pacing Amplitude: 2 V
Lead Channel Setting Pacing Pulse Width: 0.5 ms
Lead Channel Setting Sensing Sensitivity: 0.5 mV
Pulse Gen Serial Number: 211016498

## 2023-10-16 NOTE — Patient Instructions (Signed)

## 2023-10-23 ENCOUNTER — Ambulatory Visit: Payer: Self-pay | Admitting: Cardiovascular Disease

## 2023-12-29 ENCOUNTER — Ambulatory Visit: Payer: Medicare Other

## 2023-12-29 DIAGNOSIS — I471 Supraventricular tachycardia, unspecified: Secondary | ICD-10-CM

## 2023-12-30 LAB — CUP PACEART REMOTE DEVICE CHECK
Battery Remaining Longevity: 104 mo
Battery Remaining Percentage: 85 %
Battery Voltage: 3.01 V
Brady Statistic RV Percent Paced: 1 %
Date Time Interrogation Session: 20251222020404
HighPow Impedance: 62 Ohm
Implantable Lead Connection Status: 753985
Implantable Lead Implant Date: 20240322
Implantable Lead Location: 753860
Implantable Lead Model: 7122
Implantable Pulse Generator Implant Date: 20240322
Lead Channel Impedance Value: 430 Ohm
Lead Channel Pacing Threshold Amplitude: 0.5 V
Lead Channel Pacing Threshold Pulse Width: 0.5 ms
Lead Channel Sensing Intrinsic Amplitude: 12 mV
Lead Channel Setting Pacing Amplitude: 2 V
Lead Channel Setting Pacing Pulse Width: 0.5 ms
Lead Channel Setting Sensing Sensitivity: 0.5 mV
Pulse Gen Serial Number: 211016498

## 2023-12-31 NOTE — Progress Notes (Signed)
 Remote ICD Transmission

## 2024-01-07 ENCOUNTER — Ambulatory Visit: Payer: Self-pay | Admitting: Cardiovascular Disease
